# Patient Record
Sex: Female | Born: 1965 | Race: White | Hispanic: No | Smoking: Never smoker
Health system: Southern US, Community
[De-identification: ages and names within clinical notes are randomized; demographics above are authoritative.]

## PROBLEM LIST (undated history)

## (undated) DIAGNOSIS — K769 Liver disease, unspecified: Secondary | ICD-10-CM

## (undated) DIAGNOSIS — E282 Polycystic ovarian syndrome: Secondary | ICD-10-CM

## (undated) DIAGNOSIS — G43909 Migraine, unspecified, not intractable, without status migrainosus: Secondary | ICD-10-CM

## (undated) DIAGNOSIS — G4730 Sleep apnea, unspecified: Secondary | ICD-10-CM

## (undated) DIAGNOSIS — R748 Abnormal levels of other serum enzymes: Secondary | ICD-10-CM

## (undated) DIAGNOSIS — E038 Other specified hypothyroidism: Secondary | ICD-10-CM

## (undated) DIAGNOSIS — E039 Hypothyroidism, unspecified: Secondary | ICD-10-CM

## (undated) DIAGNOSIS — J45909 Unspecified asthma, uncomplicated: Secondary | ICD-10-CM

## (undated) HISTORY — DX: Other specified hypothyroidism: E03.8

## (undated) HISTORY — DX: Unspecified asthma, uncomplicated: J45.909

## (undated) HISTORY — DX: Sleep apnea, unspecified: G47.30

## (undated) HISTORY — DX: Polycystic ovarian syndrome: E28.2

## (undated) HISTORY — DX: Hypothyroidism, unspecified: E03.9

## (undated) HISTORY — DX: Migraine, unspecified, not intractable, without status migrainosus: G43.909

## (undated) HISTORY — DX: Abnormal levels of other serum enzymes: R74.8

## (undated) HISTORY — DX: Liver disease, unspecified: K76.9

---
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
## 1998-03-07 HISTORY — PX: LITHOTRIPSY: SUR834

## 2009-03-07 HISTORY — PX: OTHER SURGICAL HISTORY: SHX169

## 2010-01-12 ENCOUNTER — Encounter: Admission: RE | Admit: 2010-01-12 | Discharge: 2010-01-12 | Payer: Self-pay | Admitting: Unknown Physician Specialty

## 2010-03-07 HISTORY — PX: LASER ABLATION: SHX1947

## 2010-09-21 ENCOUNTER — Ambulatory Visit (HOSPITAL_COMMUNITY): Payer: Self-pay | Admitting: Psychology

## 2010-09-27 ENCOUNTER — Ambulatory Visit (INDEPENDENT_AMBULATORY_CARE_PROVIDER_SITE_OTHER): Payer: BC Managed Care – PPO | Admitting: Psychology

## 2010-09-27 DIAGNOSIS — F331 Major depressive disorder, recurrent, moderate: Secondary | ICD-10-CM

## 2010-10-11 ENCOUNTER — Encounter (HOSPITAL_COMMUNITY): Payer: BC Managed Care – PPO | Admitting: Psychology

## 2010-10-13 ENCOUNTER — Encounter (INDEPENDENT_AMBULATORY_CARE_PROVIDER_SITE_OTHER): Payer: BC Managed Care – PPO | Admitting: Psychology

## 2010-10-13 DIAGNOSIS — F331 Major depressive disorder, recurrent, moderate: Secondary | ICD-10-CM

## 2010-10-26 ENCOUNTER — Encounter (INDEPENDENT_AMBULATORY_CARE_PROVIDER_SITE_OTHER): Payer: BC Managed Care – PPO | Admitting: Psychology

## 2010-10-26 DIAGNOSIS — F331 Major depressive disorder, recurrent, moderate: Secondary | ICD-10-CM

## 2010-11-03 ENCOUNTER — Encounter (HOSPITAL_COMMUNITY): Payer: BC Managed Care – PPO | Admitting: Psychology

## 2010-11-10 ENCOUNTER — Encounter (INDEPENDENT_AMBULATORY_CARE_PROVIDER_SITE_OTHER): Payer: BC Managed Care – PPO | Admitting: Psychology

## 2010-11-10 DIAGNOSIS — F331 Major depressive disorder, recurrent, moderate: Secondary | ICD-10-CM

## 2010-11-24 ENCOUNTER — Encounter (INDEPENDENT_AMBULATORY_CARE_PROVIDER_SITE_OTHER): Payer: BC Managed Care – PPO | Admitting: Psychology

## 2010-11-24 DIAGNOSIS — F331 Major depressive disorder, recurrent, moderate: Secondary | ICD-10-CM

## 2010-11-24 DIAGNOSIS — F411 Generalized anxiety disorder: Secondary | ICD-10-CM

## 2010-12-08 ENCOUNTER — Encounter (INDEPENDENT_AMBULATORY_CARE_PROVIDER_SITE_OTHER): Payer: BC Managed Care – PPO | Admitting: Psychology

## 2010-12-08 DIAGNOSIS — F331 Major depressive disorder, recurrent, moderate: Secondary | ICD-10-CM

## 2010-12-22 ENCOUNTER — Encounter (INDEPENDENT_AMBULATORY_CARE_PROVIDER_SITE_OTHER): Payer: BC Managed Care – PPO | Admitting: Psychology

## 2010-12-22 DIAGNOSIS — F331 Major depressive disorder, recurrent, moderate: Secondary | ICD-10-CM

## 2011-01-05 ENCOUNTER — Encounter (HOSPITAL_COMMUNITY): Payer: BC Managed Care – PPO | Admitting: Psychology

## 2011-03-14 ENCOUNTER — Ambulatory Visit (INDEPENDENT_AMBULATORY_CARE_PROVIDER_SITE_OTHER): Payer: BC Managed Care – PPO | Admitting: Psychology

## 2011-03-14 DIAGNOSIS — F419 Anxiety disorder, unspecified: Secondary | ICD-10-CM

## 2011-03-14 DIAGNOSIS — F411 Generalized anxiety disorder: Secondary | ICD-10-CM

## 2011-03-14 DIAGNOSIS — Z7189 Other specified counseling: Secondary | ICD-10-CM

## 2011-03-14 DIAGNOSIS — F605 Obsessive-compulsive personality disorder: Secondary | ICD-10-CM | POA: Insufficient documentation

## 2011-03-14 DIAGNOSIS — F331 Major depressive disorder, recurrent, moderate: Secondary | ICD-10-CM

## 2011-03-14 DIAGNOSIS — Z62820 Parent-biological child conflict: Secondary | ICD-10-CM

## 2011-03-14 DIAGNOSIS — Z630 Problems in relationship with spouse or partner: Secondary | ICD-10-CM

## 2011-03-14 NOTE — Progress Notes (Signed)
THERAPIST PROGRESS NOTE  Session Time: 1005- 1111  Participation Level: Active  Behavioral Response: Well GroomedAlertDepressed  Type of Therapy: Individual Therapy  Treatment Goals addressed: Anxiety, Coping and Diagnosis: obsessive compulsive personality  Interventions: CBT, Solution Focused, Strength-based, Psychosocial Skills: sharing emotions, dealing with childhood issues and how they impact adult life and Supportive  Summary: Vonita Calloway is a 46 y.o. female who presents as pleasant and nicely groomed. She reports she had an okay holiday was alright.  She feels frustrated that when she had her time off work her family was unwilling to go to the movies with her. She reports that when her later time she had an okay time. The patient talked at length about the frustration she feels with her mother is depressed and in need of medication. She reports that she is very negative, as focused on how no one is there for her, and everything is wrong in her life. The patient reports her mother struggle to make any big decisions on her and that as a child her mother made all the major decisions and so it appears as if she never had the opportunity to learn. I suggested to the patient that the description she provide her her mother was very similar to what I see her in learning about her tickets this is true. She'll ever reports that she did take medication because she doesn't want to turn out like her aunt and her mother.  A commented to the patient that it appeared we were talking about problems although time when she came in for visits but that very seldom what she focus on solutions. She admits that she notices the same thing and I propose to her that she consider what goal she wishes to address in her therapy that will help improve the quality of her life. She remarked that her childhood was not grade and she was picked on quite a bit and that she tends not to trust anyone. I reminded her that  what we are his children isn't always true and as an adult we of choice to believe or not believe that this is how we did not realize we did. I suggested that she consider talking about her growing up years since his has had such an impact on her she is today and she admits that it feels scary and it makes her feel axis even think about doing so. I suggested this will provide her an opportunity to free herself of these issues and move forward in her life  The patient admits that she tends to be the control figure in a house and that her husband puts it "wears depends in the family". She admits that she has been very diligent in tracking expenses and she was a 66 year old child she reports absolutely every amount of money that she spends in a spreadsheet on a daily basis and admits that she is obsessive in this way the thought of not doing so contraceptive fear for her is missing something. She is afraid for her husband to be in charge of her finances because he has no experience and he was never taught how to do as a child. She admits that she seems a lot of control in the home for fear that something will happen to needs to be done.   Suicidal/Homicidal: No  Plan: Return again in 2 weeks.  Diagnosis: Axis I: Generalized Anxiety Disorder, Major depressive disorder, recurrent, moderate    Axis II: Obsessive- Compulsive Personality  [name], [facility] 03/14/2011

## 2011-03-14 NOTE — Patient Instructions (Signed)
1- Think about direction you would like to take in treatment. 2- Continue being aware of your thoughts and diverting negative thoughts.

## 2011-03-30 ENCOUNTER — Encounter (HOSPITAL_COMMUNITY): Payer: Self-pay | Admitting: Psychology

## 2011-03-30 ENCOUNTER — Ambulatory Visit (INDEPENDENT_AMBULATORY_CARE_PROVIDER_SITE_OTHER): Payer: BC Managed Care – PPO | Admitting: Psychology

## 2011-03-30 DIAGNOSIS — F411 Generalized anxiety disorder: Secondary | ICD-10-CM

## 2011-03-30 DIAGNOSIS — F419 Anxiety disorder, unspecified: Secondary | ICD-10-CM

## 2011-03-30 DIAGNOSIS — F605 Obsessive-compulsive personality disorder: Secondary | ICD-10-CM

## 2011-03-30 DIAGNOSIS — F331 Major depressive disorder, recurrent, moderate: Secondary | ICD-10-CM

## 2011-03-30 NOTE — Patient Instructions (Signed)
1-The patient is to meet with her spouse about plans for daughter's college. 2-Think about what you want your life to look like and what would cause you to be happy; be prepared to share at next visit. 3-List at least 6 positive attributes that you possess.

## 2011-03-30 NOTE — Progress Notes (Signed)
   THERAPIST PROGRESS NOTE  Session Time: 858 484 [id number] am  Participation Level: Active  Behavioral Response: Well GroomedAlertEuthymic  Type of Therapy: Individual Therapy  Treatment Goals addressed: Communication: with husband and Coping  Interventions: Solution Focused, Strength-based, Psychosocial Skills: communication and Supportive  Summary: Julia [name] is a 46 y.o. female who presents as pleasant and easily engaged. She has had a tough few weeks with additional situational stressors that are taking up her time and her focus.  She shared details of a problem in the family cabin in [place] that resulted in numerous trips there and a lot of work to deal with a power outage after the snow last week.  She is also dealing with thoughts and feelings related to her daughter's desire to attend a school in [place] that is very expensive and out of the budget.  The patient talked a great deal about the lack of support she feels from her husband around this issue given that she has been talking about it with him and their daughter for the past several years.  She feels disrespected and devalued by both her husband and daughter.  While talking about the issues, the patient began talking about the couple's marriage and his remark in front of their daughter's last evening that he will just seek a divorce.  She is not happy in her marriage but doesn't know what to do about that.  Her husband has told her his plans to move to [place] and he has been looking at homes.  She has told him she will not move and that she is happy where she is in [place].  The patient thinks he is serious about this and wonders if joining forces with her daughter about her going to [place] would cause the adolescent to appreciate her father more (since she has always had a problem with him).  I asked the patient what causes her to stay and she is not sure.  She sees herself as working much harder than her husband given that he  accepted an easier job working for $40k per year which is half of his earning potential.  She feels the biggest burden to bring in the income since he refuses to do anything differently.  Her husband has been having emotional affairs with women via the Internet and some are his old classmates.  She isn't going to be able to get off work to go to his class reunion and doesn't want him to go himself because she doesn't trust him.  Suicidal/Homicidal: No  Plan: Return again in 2 weeks.  Diagnosis: Axis I: Generalized Anxiety Disorder, Major depressive disorder    Axis II: No diagnosis    [name], [facility] 03/30/2011

## 2011-04-12 ENCOUNTER — Ambulatory Visit (INDEPENDENT_AMBULATORY_CARE_PROVIDER_SITE_OTHER): Payer: BC Managed Care – PPO | Admitting: Psychology

## 2011-04-12 ENCOUNTER — Encounter (HOSPITAL_COMMUNITY): Payer: Self-pay | Admitting: Psychology

## 2011-04-12 DIAGNOSIS — F331 Major depressive disorder, recurrent, moderate: Secondary | ICD-10-CM

## 2011-04-12 DIAGNOSIS — F605 Obsessive-compulsive personality disorder: Secondary | ICD-10-CM

## 2011-04-12 DIAGNOSIS — F411 Generalized anxiety disorder: Secondary | ICD-10-CM

## 2011-04-12 DIAGNOSIS — F419 Anxiety disorder, unspecified: Secondary | ICD-10-CM

## 2011-04-12 NOTE — Patient Instructions (Signed)
1-Write a five year plan. 2-Consider contacting an independent person to consult regarding college affordability.

## 2011-04-12 NOTE — Progress Notes (Signed)
   THERAPIST PROGRESS NOTE  Session Time: 905- 1005 am  Participation Level: Active  Behavioral Response: NeatAlertEuthymic  Type of Therapy: Individual Therapy  Treatment Goals addressed: Anger, Anxiety and Coping  Interventions: Solution Focused, Strength-based, Psychosocial Skills: communication, self-care, boundary setting and Supportive  Summary: Julia [name] is a 46 y.o. female who presents a few minutes late for here appointment.  She reports she 'chickened out' from talking with her husband about college and financial issues related to their daughter [name].  She did not want to get into an argument and therefore avoided the conversation despite that they had time to do so when they attended church without their daughters.  The patient feels irritated with her husband and daughter because of the situation given that she has been telling both of them for the past two years that they cannot afford to send her to an expensive college.  Her younger daughter shared that her older daughter was texting while driving so she attempted to do some checking up on this by examining the bills; she discovered her daughter had a massive number of texts and it would take an inordinate amount of time to review.  While she was checking phone calls she checked to see if her husband was still in touch with two women with whom he graduated and has talked to their entire married life.  He reports one of the women got married and she doesn't see any calls to her.  The other woman got divorced and is in touch with him daily in the afternoon before the patient returns from work.  She feels angry with him but really struggled to identify the vulnerable feelings.  She agrees that she does parent him in many ways and he does whatever he desires.  I commented that based on how she has shared their interactions that she has assumed that role.  The patient relayed that she really thought about the last time she was  happy and she reports she is not sure if there was ever a time she was happy.  I talked with her about the possibility of psychiatric evaluation to determine medication needs and she remains unsure but will consider.  Suicidal/Homicidal: No  Plan: Return again in 2 weeks.  Diagnosis: Axis I: Anxiety Disorder NOS, Major depressive disorder, recurrent    Axis II: Obsessive- Compulsive Personality     [name], [facility] 04/12/2011

## 2011-04-27 ENCOUNTER — Ambulatory Visit (INDEPENDENT_AMBULATORY_CARE_PROVIDER_SITE_OTHER): Payer: BC Managed Care – PPO | Admitting: Psychology

## 2011-04-27 DIAGNOSIS — F419 Anxiety disorder, unspecified: Secondary | ICD-10-CM

## 2011-04-27 DIAGNOSIS — Z630 Problems in relationship with spouse or partner: Secondary | ICD-10-CM

## 2011-04-27 DIAGNOSIS — Z62820 Parent-biological child conflict: Secondary | ICD-10-CM

## 2011-04-27 DIAGNOSIS — F411 Generalized anxiety disorder: Secondary | ICD-10-CM

## 2011-04-27 DIAGNOSIS — F331 Major depressive disorder, recurrent, moderate: Secondary | ICD-10-CM

## 2011-04-27 DIAGNOSIS — Z7189 Other specified counseling: Secondary | ICD-10-CM

## 2011-04-27 DIAGNOSIS — F605 Obsessive-compulsive personality disorder: Secondary | ICD-10-CM

## 2011-04-27 NOTE — Patient Instructions (Addendum)
1-Tap into feelings about the love that you have for your daughter and how you want to be there for her. 2-Take time on non-work days to be authentic in your feelings with you.

## 2011-04-29 ENCOUNTER — Encounter (HOSPITAL_COMMUNITY): Payer: Self-pay | Admitting: Psychology

## 2011-04-29 NOTE — Progress Notes (Signed)
   THERAPIST PROGRESS NOTE  Session Time: 405- 500 pm  Participation Level: Active  Behavioral Response: NeatAlertEuthymic  Type of Therapy: Individual Therapy  Treatment Goals addressed: Communication: in family and Coping  Interventions: Solution Focused, Strength-based, Psychosocial Skills: communicating thoughts and feelings and Supportive  Summary: Brexlee Heberlein is a 46 y.o. female who presents on time for her appointment. The patient is negatively focused which is her usual presentation.  She began the session by talking about work issues and I redirected her to address the family issues.  She reports having had a conversation with her husband about the finances and laid out their budget to show him that their daughter attending a school at a cost of 20k per year was unrealistic for them and she will not qualify for financial aid.  He appeared surprised and actually worked the numbers and came up with the same amount.  She reports he will still not commit to engage the conversation with their daughter and has asked the patient several times if she has done so.  He avoids scheduling a time where they can present the information together.  I suggested it appears she has accepted the role of 'bad cop' and he is 'good cop' and she agrees.  She is angry about this but admits she has allowed this to happen and knows that if it were left up to her husband he would never tell her and would continue to discuss this as an option.  The patient began addressing her daughter's kidney issues and admits she is very sick physically and that she has school avoidant behaviors as well and that the chance of her attending school regularly is low.  I suggested that she talk about her feelings of having a child with a chronic illness and having to see her in pain.  The patient was unable to do so and admits that she has locked her feelings up for so long that she cannot.  She did tear up but would not permit  herself to cry.  At any point I attempted to draw out her feelings she diverted to intellect and I discussed with her how this is how I have been relating to her in order to establish a trusting relationship.  I suggested it was time for her to try and tap her feelings.  Suicidal/Homicidal: No  Plan: Return again in 2 weeks.  Diagnosis: Axis I: Major depressive disorder,  Anxiety Disorder NOS    Axis II: No diagnosis    [name], [facility] 04/29/2011

## 2011-05-11 ENCOUNTER — Encounter (HOSPITAL_COMMUNITY): Payer: Self-pay | Admitting: Psychology

## 2011-05-11 ENCOUNTER — Ambulatory Visit (INDEPENDENT_AMBULATORY_CARE_PROVIDER_SITE_OTHER): Payer: BC Managed Care – PPO | Admitting: Psychology

## 2011-05-11 DIAGNOSIS — F605 Obsessive-compulsive personality disorder: Secondary | ICD-10-CM

## 2011-05-11 DIAGNOSIS — F331 Major depressive disorder, recurrent, moderate: Secondary | ICD-10-CM

## 2011-05-11 DIAGNOSIS — F411 Generalized anxiety disorder: Secondary | ICD-10-CM

## 2011-05-11 DIAGNOSIS — F419 Anxiety disorder, unspecified: Secondary | ICD-10-CM

## 2011-05-11 NOTE — Progress Notes (Signed)
   THERAPIST PROGRESS NOTE  Session Time: 1003- 1108 am  Participation Level: Active  Behavioral Response: NeatAlertEuthymic  Type of Therapy: Individual Therapy  Treatment Goals addressed: Anger, Communication: relationships and Coping  Interventions: Solution Focused, Strength-based, Psychosocial Skills: coping, communication and Supportive  Summary: Julia [name] is a 46 y.o. female who presents on time for her appointment.  The patient is pleasant and easily engaged.  She talked a lot about the issues she is struggling with her 32 year old daughter.  She has been ill but is slow to follow up with needed medical care and has been out of school for three weeks first because of whooping cough and kidney infection but then because she simply refused to attend.  I talked with the patient about her daughter's choice to attend/not attend school and about how she and her husband need to back off and let her face consequences for her decisions.  The patient struggles to know when it is okay to back off because she thought that she and her husband could get in trouble for not sending her to school.  I informed the patient that her daughter is 32 and she can decide not to go to school and that they are not accountable.  I also suggested that using natural and logical consequences might be the best way to go.  I reminded her that having her at home to experience the consequences provides for teachable moments when she makes poor choices.  I also suggested that her refusal to seek medical care was a choice and that if she got a UTI she would need to deal with the pain and the consequence of not getting her medical treatment.  The patient was unable to complete the homework where I asked her to tap into her feelings related to her daughter.  She is very disconnected from her feelings and admits she has been like this since her children were young.  She admits that after she checked out emotionally this is  when her husband started talking to other women.  I suggested that they could change this and that marital therapy was always an option.  She is uncertain if she wishes to pursue this.  I suggested she invite her spouse in for a session but then learned that he wasn't even aware that she was in therapy.  I suggested she consider discussing with him and invite him in to discuss how to deal with issues related to their daughter.  Suicidal/Homicidal: No  Plan: Return again in 2 weeks.  Diagnosis: Axis I: Anxiety Disorder NOS, Major depressive disorder, recurrent    Axis II: No diagnosis    [name], [facility] 05/11/2011

## 2011-05-11 NOTE — Patient Instructions (Signed)
1-Consider setting boundaries with your daughter around school attendance and caring for her own medical needs. 2-Consider marital therapy.

## 2011-05-30 ENCOUNTER — Ambulatory Visit (INDEPENDENT_AMBULATORY_CARE_PROVIDER_SITE_OTHER): Payer: BC Managed Care – PPO | Admitting: Psychology

## 2011-05-30 DIAGNOSIS — Z62820 Parent-biological child conflict: Secondary | ICD-10-CM

## 2011-05-30 DIAGNOSIS — Z630 Problems in relationship with spouse or partner: Secondary | ICD-10-CM

## 2011-05-30 DIAGNOSIS — F419 Anxiety disorder, unspecified: Secondary | ICD-10-CM

## 2011-05-30 DIAGNOSIS — F605 Obsessive-compulsive personality disorder: Secondary | ICD-10-CM

## 2011-05-30 DIAGNOSIS — F331 Major depressive disorder, recurrent, moderate: Secondary | ICD-10-CM

## 2011-05-30 DIAGNOSIS — F411 Generalized anxiety disorder: Secondary | ICD-10-CM

## 2011-05-30 DIAGNOSIS — Z7189 Other specified counseling: Secondary | ICD-10-CM

## 2011-05-30 NOTE — Patient Instructions (Addendum)
1-Take at least one hour out for yourself daily. 2-Identify at least one activity that you would be willing to engage. 3-Let you yes be yes and your no be no. 4-Do not permit anyone to boss you around except your boss. 

## 2011-05-30 NOTE — Progress Notes (Deleted)
1-Take at least one hour out for yourself daily. 2-Identify at least one activity that you would be willing to engage. 3-Let you yes be yes and your no be no. 4-Do not permit anyone to boss you around except your boss.

## 2011-05-31 ENCOUNTER — Encounter (HOSPITAL_COMMUNITY): Payer: Self-pay | Admitting: Psychology

## 2011-05-31 NOTE — Progress Notes (Signed)
   THERAPIST PROGRESS NOTE  Session Time: 910- 1005 am  Participation Level: Active  Behavioral Response: NeatAlertTearfulSad  Type of Therapy: Individual Therapy  Treatment Goals addressed: Communication: assertive and Coping  Interventions: Solution Focused, Strength-based, Psychosocial Skills: communication, coping, self-care and Supportive  Summary: Avanthika Dehnert is a 46 y.o. female who presents as quiet but pleasant.  She reports things have not been going well and that she has been more depressed and hopeless due to family issues.  Her daughter has continued to be ill and this is stressful for her.  When discussing she went on to talk about her husband's sister and parents moving to [place] and that this will increase her stress too.  I brought the patient back to talk about her and suggested that she will move away from talking about her feelings to talking about circumstances and she admits this is true.  I prodded the patient and she reports that her husband and both daughters had been making hurtful remarks to her about not having any friends and not being liked.  She knows that she is permitting this to occur and that her children have listened and watched how she and her husband have interacted for years.  She feels defeated by the family.  She is not suicidal and denies that she would ever kill herself or hurt anyone else.  She feels stuck and unsure how to change her circumstances.  I talked with the patient about the importance of good self-care and she stared at me with a blank look as if she had never heard this before.  With prompting the patient was able to identify what she has done in the past to care for herself including scrap booking but admits she is overwhelmed with trying to manage things for her family that she doesn't have the time.  She was able to come up with a few options to improve her self-care but required assistance to expand those ideas.  She thinks going to  her cabin for a little rest might be helpful but admits that she would likely clean while there and suggests this is something she enjoys.  I talked with the patient about how the words of others can cause wounds.  She admits that this occurred when a child and that she doesn't have any friends because this wounding was so deep.  I suggested that she has many positive characteristics and that she would be able to develop friendships if she took the time to do so.  She was tearful throughout our discussion about this and admits to increased depression but denies any suicidal thinking or homicidal thinking.  I explained the availability of MH IOP and suggested this as an option but she is not willing.  She was willing to accept a psychiatry appointment for evaluation of medication since this has never occurred.  She has only ever seen her family doctor and has never achieved positive outcomes from the antidepressant.  Suicidal/Homicidal: No  Plan: Return again in 1 weeks.  Diagnosis: Axis I: Major depressive disorder, anxiety disorder    Axis II: No diagnosis    [name], [facility] 05/31/2011

## 2011-06-07 ENCOUNTER — Ambulatory Visit (INDEPENDENT_AMBULATORY_CARE_PROVIDER_SITE_OTHER): Payer: BC Managed Care – PPO | Admitting: Psychiatry

## 2011-06-07 ENCOUNTER — Encounter (HOSPITAL_COMMUNITY): Payer: Self-pay | Admitting: Psychiatry

## 2011-06-07 VITALS — BP 124/94 | HR 82 | Ht 65.25 in | Wt 194.0 lb

## 2011-06-07 DIAGNOSIS — R748 Abnormal levels of other serum enzymes: Secondary | ICD-10-CM

## 2011-06-07 MED ORDER — BUPROPION HCL ER (XL) 150 MG PO TB24: 150.0000 mg | ORAL_TABLET | Freq: Every day | ORAL | Status: DC
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
## 2011-06-13 ENCOUNTER — Encounter (HOSPITAL_COMMUNITY): Payer: Self-pay | Admitting: Psychology

## 2011-06-13 ENCOUNTER — Ambulatory Visit (INDEPENDENT_AMBULATORY_CARE_PROVIDER_SITE_OTHER): Payer: BC Managed Care – PPO | Admitting: Psychology

## 2011-06-13 DIAGNOSIS — F331 Major depressive disorder, recurrent, moderate: Secondary | ICD-10-CM

## 2011-06-13 NOTE — Progress Notes (Signed)
   THERAPIST PROGRESS NOTE  Session Time: 904-  Participation Level: Active  Behavioral Response: NeatAlertEuthymic with daily headache for past week (no relief)  Type of Therapy: Individual Therapy  Treatment Goals addressed: Anxiety, Communication: in relationships and Coping  Interventions: Solution Focused, Strength-based, Psychosocial Skills: self-care, communication, coping with stress and Supportive  Summary: Julia [name] is a 46 y.o. female who presents as pleasant but in pain from a daily headache that has persisted with no relief for the past week.  She has consulted her headache MD who had no suggestions for her.  She continues to take OTC's but is not getting any relief.  This counselor and the patient talked about the importance of self-care in order to improve her depressive symptoms.  She admits she doesn't know how to control her anxiety since she feels limited by plantar fascitis plan and cannot exercise.  I talked with the patient about other strategies for coping as well as alternative forms of exercise that doesn't have to stress her feet (chair exercises, water aerobics/zumba).  The patient appears reluctant to commit to change because of the demands placed on her by her family.  I suggested the patient needed to start caring for her needs first.  She talked about the need to eat more wisely.  We addressed the importance of a healthy diet for her body and because her children are learning nutrition from her.  I suggested she consider having her daughters participate in menu planning, grocery shopping and meal prep; after some discussion the patient agreed this would be good and that both of her children have expressed interest.  She and her mother are going up [place] on a trip to visit family in [place] and [place]; she identified the negatives (staying with her aunt in [place] and having to take the dogs) and I suggested that both of these were choices and that she could stay in a hotel  and board the dogs here in [place] and really take a vacation.  I sometimes wonder if she doesn't set herself up to be unhappy.  Suicidal/Homicidal: No  Plan: Return again in 3 weeks after she returns from her trip.  Diagnosis: Axis I: Major depressive disorder, moderate; anxiety disorder    Axis II: No diagnosis    [name], [facility] 06/13/2011

## 2011-06-13 NOTE — Patient Instructions (Addendum)
1-Make plans to go to [place] with mother. 2-Introduce menu planning to daughters and let them pick the days they will cook (two days each).  Take aways: HALT- Are you hungry, angry, lonely tired? Why are you eating? Only eat when your body is physically telling you it needs.

## 2011-06-28 ENCOUNTER — Ambulatory Visit (INDEPENDENT_AMBULATORY_CARE_PROVIDER_SITE_OTHER): Payer: BC Managed Care – PPO | Admitting: Psychology

## 2011-06-28 DIAGNOSIS — Z62820 Parent-biological child conflict: Secondary | ICD-10-CM

## 2011-06-28 DIAGNOSIS — F331 Major depressive disorder, recurrent, moderate: Secondary | ICD-10-CM

## 2011-06-28 DIAGNOSIS — Z7189 Other specified counseling: Secondary | ICD-10-CM

## 2011-06-28 DIAGNOSIS — F411 Generalized anxiety disorder: Secondary | ICD-10-CM

## 2011-06-28 DIAGNOSIS — F419 Anxiety disorder, unspecified: Secondary | ICD-10-CM

## 2011-06-28 DIAGNOSIS — F605 Obsessive-compulsive personality disorder: Secondary | ICD-10-CM

## 2011-06-29 ENCOUNTER — Encounter (HOSPITAL_COMMUNITY): Payer: Self-pay | Admitting: Psychology

## 2011-06-29 NOTE — Progress Notes (Signed)
   THERAPIST PROGRESS NOTE  Session Time: 1015- 1107 am  Participation Level: Active  Behavioral Response: NeatAlertEuthymic  Type of Therapy: Individual Therapy  Treatment Goals addressed: Communication: thoughts and feelings and Coping  Interventions: Solution Focused, Strength-based, Psychosocial Skills: coping, communication and Supportive  Summary: Julia [name] is a 46 y.o. female who presents as pleasant and easily engaged.  She appears relaxed and in a good mood.  She shared details of the two weeks she was away from home with most of the content being positive.  She dealt with a few instances of difficult family but reports that overall if was a good time away and she is tired but refreshed.  The patient did not complain about her work, she did minimal complaining about her husband and children.  She is appropriately concerned that her oldest daughter has not completed her financial aid application or made any moves toward next year and entering college.  I suggested to her mother that perhaps this was her way of sabotaging herself because she is really not ready to go away from home; her mother doesn't know and doesn't plan on completing the necessary steps.  She sat down with her husband because he has been requesting to see their budget and was questioning her on why she says they don't have enough money.  She was able to show him the budget but he is still somewhat unsure.  I suggested that oftentimes when talking about her husband she sounds parental and she admits this is true.  I suggested that she begin to place him on equal footing and that in doing so their relationship might improve; she is willing to try.  Suicidal/Homicidal: No  Plan: Return again in 2 weeks.  Diagnosis: Axis I: Generalized Anxiety Disorder; Major depressive disorder, recurrent    Axis II: Obsessive- Compulsive Personality     [name], [facility] 06/29/2011

## 2011-07-05 ENCOUNTER — Encounter (HOSPITAL_COMMUNITY): Payer: Self-pay | Admitting: Psychiatry

## 2011-07-05 ENCOUNTER — Ambulatory Visit (INDEPENDENT_AMBULATORY_CARE_PROVIDER_SITE_OTHER): Payer: BC Managed Care – PPO | Admitting: Psychiatry

## 2011-07-05 VITALS — BP 135/95 | HR 100 | Ht 65.25 in | Wt 195.0 lb

## 2011-07-05 DIAGNOSIS — Z7189 Other specified counseling: Secondary | ICD-10-CM

## 2011-07-05 DIAGNOSIS — F331 Major depressive disorder, recurrent, moderate: Secondary | ICD-10-CM

## 2011-07-05 DIAGNOSIS — Z62820 Parent-biological child conflict: Secondary | ICD-10-CM

## 2011-07-05 MED ORDER — BUPROPION HCL ER (XL) 150 MG PO TB24: 150.0000 mg | ORAL_TABLET | Freq: Every day | ORAL | Status: DC

## 2011-07-05 NOTE — Progress Notes (Signed)
[facility] Behavioral Health Follow-up Outpatient Visit  Julia [name] [birth year]  Date: 07/05/11  History of Chief Complaint: No chief complaint on file.   PRESENTING CHIEF COMPLAINT:  HPI Comments: HISTORY OF CURRENT ILLNESS:  Julia [name] is a 46 Y/o female with a past psychiatric history significant for Major Depressive Disorder, recurrent moderate. The patient is referred for psychiatric services for  medication management.   The patient reports that her main stressors continue to be: her 54 y/o daughter, her marital stressors, her father passing away, and is now dealing with her mother living close. In the area of affective symptoms, patient appears euthymic. Patient denies current suicidal ideation, intent, or plan. Patient denies current homicidal ideation, intent, or plan. Patient denies auditory hallucinations. Patient denies visual hallucinations. Patient denies symptoms of paranoia. Patient states sleep is good, with approximately 8.5 hours of sleep per night with a CPAP. Appetite is good, but she admits to continued ingestion of junk food. Energy level is low. Patient endorses symptoms of anhedonia. Patient endorses hopelessness, helplessness, or guilt.   Denies any recent episodes consistent with mania, particularly decreased need for sleep with increased energy, grandiosity, impulsivity, hyperverbal and pressured speech, or increased productivity. Denies any recent symptoms consistent with psychosis, particularly auditory or visual hallucinations, thought broadcasting/insertion/withdrawal, or ideas of reference. Also denies excessive worry to the point of physical symptoms as well as any panic attacks. Denies any history of trauma or symptoms consistent with PTSD such as flashbacks, nightmares, hypervigilance, feelings of numbness or inability to connect with others.   Review of Systems  Constitutional: Positive for fatigue.  Respiratory: Negative for chest tightness and shortness of  breath.  Cardiovascular: Negative for chest pain.  Psychiatric/Behavioral: Positive for sleep disturbance and decreased concentration. Negative for suicidal ideas, hallucinations, behavioral problems, confusion, self-injury and agitation.   (Hypo) Manic Symptoms:  Elevated Mood: No  Irritable Mood: No  Grandiosity: No  Distractibility: No  Lability of Mood: No  Delusions: No  Hallucinations: No  Impulsivity: No  Sexually Inappropriate Behavior: No  Financial Extravagance: No  Flight of Ideas: No  Anxiety Symptoms:  Excessive Worry: Yes  Panic Symptoms: No-last attack in college  Agoraphobia: No  Obsessive Compulsive: No Specific Phobias: Yes Claustrophobia  Social Anxiety: Yes  Psychotic Symptoms:  Hallucinations: No None  Delusions: No  Paranoia: No  Ideas of Reference: No  PTSD Symptoms:  Ever had a traumatic exposure: No  Had a traumatic exposure in the last month: No  Re-experiencing: No None  Hypervigilance: No  Hyperarousal: No None  Avoidance: No None  Traumatic Brain Injury: No   Physical Exam  Vitals reviewed.  Constitutional: No distress.     History of Loss of Consciousness: No  Seizure History: No  Cardiac History: No   Allergies: Reviewed Allergies   Allergen  Reactions   .  Relafen (Nabumetone)  Hives, Itching and Swelling   .  Sulfa Antibiotics     PCP: Harl Bowie   Current Medications: Reviewed Current Outpatient Prescriptions on File Prior to Visit  Medication Sig Dispense Refill  . albuterol (PROVENTIL HFA;VENTOLIN HFA) 108 (90 BASE) MCG/ACT inhaler Inhale 2 puffs into the lungs every 6 (six) hours as needed. prn       . ampicillin (PRINCIPEN) 500 MG capsule Take 500 mg by mouth 2 (two) times daily. Uses only one time per day unless having a flair       . buPROPion (WELLBUTRIN XL) 150 MG 24 hr tablet Take 1 tablet (150  mg total) by mouth daily.  90 tablet  0  . Cholecalciferol (VITAMIN D) 2000 UNITS tablet Take 2,000 Units by mouth  daily. Every other day       . cyclobenzaprine (FLEXERIL) 10 MG tablet Take 10 mg by mouth 3 (three) times daily as needed. Takes one half tab       . fexofenadine (ALLEGRA) 180 MG tablet Take 180 mg by mouth daily.        . Fluticasone-Salmeterol (ADVAIR DISKUS) 100-50 MCG/DOSE AEPB Inhale 1 puff into the lungs every 12 (twelve) hours. Will use one time per day but if having a flair up will use two times per day        . L-Methylfolate (DEPLIN PO) Take 400 mcg by mouth 2 (two) times daily.      . modafinil (PROVIGIL) 200 MG tablet Take 200 mg by mouth daily. Take one and one half tablets per day       . mometasone (ASMANEX) 220 MCG/INH inhaler Inhale 1 puff into the lungs daily. Prescribed by Dr. Tempie Donning.      . montelukast (SINGULAIR) 10 MG tablet Take 10 mg by mouth at bedtime.        Marland Kitchen thyroid (ARMOUR) 30 MG tablet Take 30 mg by mouth every morning.       . topiramate (TOPAMAX) 50 MG tablet Take 100 mg by mouth at bedtime. 50 mg QAM, 100 QPM      . traMADol (ULTRAM) 50 MG tablet Take 50 mg by mouth every 6 (six) hours as needed. prn       . zolmitriptan (ZOMIG) 5 MG tablet Take 5 mg by mouth as needed. prn        Previous Psychotropic Medications: Reviewed Patient has had a trial of the following psychotropic medications:  Wellbutrin 150 mg  Celexa 20-took for a couple years-sexual side  Effexor- couldn't stay away  Prozac-took for a little while-  Patient is currently on the following psychotropic medications:  Wellbutrin   Substance Abuse History in the last 12 months: Reviewed Patient denies   SUBSTANCE USE HISTORY: Reviewed Caffeine: Rare use  Nicotine: Patient denies.  Alcohol: Patient denies.  Illicit Drugs: Patient denies.   Blackouts: No  DT's: No  Withdrawal Symptoms: No None   Medical: MGM-Strokes, Biliary cirrhosis, Hypertension,hypothyroidism; MGF-stroke, MI; Mother-basal cell cancer, HTN; Father-colon cancer;  Psychiatric illness: Maternal Aunt-Mood disorder,  Maternal great aunt-thought disorder  Substance abuse: Alcoholism: MGF, brother;  Suicides: Patient.   Mental Status Examination/Evaluation:  Objective: Appearance: Casual and Well Groomed   Eye Contact:: Good   Speech: Clear and Coherent and Normal Rate   Volume: Normal   Mood: "frustrated mood"   Affect: Appropriate and Full Range   Thought Process: Coherent, Intact and Loose   Orientation: Full   Thought Content: WDL   Suicidal Thoughts: No   Homicidal Thoughts: No   Judgement: Intact   Insight: Fair  Psychomotor Activity: Normal   Akathisia: No   Handed: Right   AIMS (if indicated): Not indicated   Assets: Communication Skills  Desire for Improvement  Financial Resources/Insurance  Housing  Intimacy  Vocational/Educational    Assessment:  AXIS I  Major Depressive Disorder, recurrent, moderate , Parent Child relational problem  AXIS II  No diagnosis   AXIS III  .  Subclinical hypothyroidism     .  Migraines     .  Asthma with allergic rhinitis     .  Sleep apnea syndrome     .  Elevated liver enzymes     .  Liver disease       Fatty liver   AXIS IV  problems with primary support group   AXIS V  51-60 moderate symptoms     PLAN:  1. Affirm with the patient that the medications are taken as ordered. Patient expressed understanding of how their medications were to be used.  2. Continue the following psychiatric medications as written prior to this appointment:  a) Continue Wellbutrin 150 mg. As noted previously, symptoms are caused by stressors which she has identified in therapy but has not yet taken steps to deal with. She does not currently have suicidal ideation or severe anhedonia, and is currently not interested in a dose adjustment. I have stressed the need to deal with her most significant stressors, particularly her daughter's problems, and later her marital issues. I have stressed the need for improvement in her diet and the incorporation of exercise in her  life, possibly as part of her one hour period daily which she is to devote to her personal development as outlined by her therapist.  B) I have asked the patient to report any worsening of depressive symptoms, at which point we will increase bupropion.  3. Therapy: brief supportive therapy provided. Discussed her psychosocial stressors. Continue current services.  4. Risks and benefits, side effects and alternatives discussed with patient, he/she was given an opportunity to ask questions about his/her medication, illness, and treatment. All current psychiatric medications have been reviewed and discussed with the patient and adjusted as clinically appropriate. The patient has been provided an accurate and updated list of the medications being now prescribed.  5. Patient told to call clinic if any problems occur. Patient advised to go to ER if she should develop SI/HI, side effects, or if symptoms worsen. Has crisis numbers to call if needed.  6. No labs warranted at this time. Patient has asked to test l-metlyfolate level and MTHFR. Will consider this if after resolving all stressors, an improvement in diet, and addition of exercise to her life routine, does not bring about resolution of her mood and fatigue.  7. The patient was encouraged to keep all PCP and specialty clinic appointments.  8. Patient was instructed to return to clinic in 1 month.  9. The patient expressed understanding of the plan outlined above and agrees with the plan.     Jacqulyn Cane, MD

## 2011-07-18 ENCOUNTER — Ambulatory Visit (INDEPENDENT_AMBULATORY_CARE_PROVIDER_SITE_OTHER): Payer: BC Managed Care – PPO | Admitting: Psychology

## 2011-07-18 ENCOUNTER — Telehealth (HOSPITAL_COMMUNITY): Payer: Self-pay

## 2011-07-18 ENCOUNTER — Encounter (HOSPITAL_COMMUNITY): Payer: Self-pay | Admitting: Psychology

## 2011-07-18 DIAGNOSIS — Z630 Problems in relationship with spouse or partner: Secondary | ICD-10-CM

## 2011-07-18 DIAGNOSIS — Z62820 Parent-biological child conflict: Secondary | ICD-10-CM

## 2011-07-18 DIAGNOSIS — Z7189 Other specified counseling: Secondary | ICD-10-CM

## 2011-07-18 DIAGNOSIS — F331 Major depressive disorder, recurrent, moderate: Secondary | ICD-10-CM

## 2011-07-18 NOTE — Progress Notes (Signed)
   THERAPIST PROGRESS NOTE  Session Time: 805- 859 am  Participation Level: Active  Behavioral Response: NeatAlertEuthymic  Type of Therapy: Individual Therapy  Treatment Goals addressed: Anxiety, Communication: in relationships and Coping  Interventions: Solution Focused, Strength-based, Psychosocial Skills: communication and coping and Supportive  Summary: Julia [name] is a 46 y.o. female who presents as pleasant and casually groomed.  She spent several days of her weeklong vacation at the family cabin with her mother and reports it was a nice break.  Her oldest daughter now has mononucleosis and she is hoping that she will have the grades to graduate in a few weeks.  She reports that her daughter has been ill for the past 18 months from various illnesses and that she is concerned about why this is happening.  She is frustrated that her daughter will not make calls to schedule her own medical appointments and would rather go to Urgent Care when she is much sicker instead of intervening sooner.  The patient doesn't think she will do well on her own because of not tending to her physical health needs.  Ms. Hilgert reports her daughter did follow through with getting the recommendation form completed for a schloarship application but then mailed the application without the recommendation.  She doesn't know how the patient intends to pay for school and the patient doesn't plan on taking out a bunch of loans that she will have to pay back.  She and her husband have not discussed this any further and i suggested this needed to occur.  I asked the patient how she intends to engage her relationship with her husband but she isn't sure.  She doesn't talk to anyone about how she feels and has never shared her feelings with him.  I asked why she thinks he talks to other women online and she admits it is likely because they talk about feelings and she avoids the topic.  I suggested that it is important for  them to work on their relationship given that her children are starting to age out of the home and she knows this is true.  She wants to be in her marriage but is anxious about how to expose her feelings.  The patient appears to be lost when trying to respond to a question in the session related to how she feels.  This is something I will continue to discuss in therapy in hopes that she will let some feelings emerge.  Suicidal/Homicidal: No  Plan: Return again in 2 weeks.  Diagnosis: Axis I: Major depressive disorder, recurrent    Axis II: No diagnosis    [name], [facility] 07/18/2011

## 2011-07-18 NOTE — Telephone Encounter (Signed)
Called patient to discuss. Initiation of 5-HTP in her treatment regime. Not knowing strength and concentration, informed the patient that sertraline would be a better option. She also asked about checking serotonin levels.

## 2011-07-18 NOTE — Telephone Encounter (Signed)
Would like to talk to you about 5-HTP. Please call

## 2011-08-09 ENCOUNTER — Encounter (HOSPITAL_COMMUNITY): Payer: Self-pay | Admitting: Psychology

## 2011-08-09 ENCOUNTER — Ambulatory Visit (INDEPENDENT_AMBULATORY_CARE_PROVIDER_SITE_OTHER): Payer: BC Managed Care – PPO | Admitting: Psychology

## 2011-08-09 DIAGNOSIS — Z7189 Other specified counseling: Secondary | ICD-10-CM

## 2011-08-09 DIAGNOSIS — Z62820 Parent-biological child conflict: Secondary | ICD-10-CM

## 2011-08-09 DIAGNOSIS — F331 Major depressive disorder, recurrent, moderate: Secondary | ICD-10-CM

## 2011-08-09 DIAGNOSIS — Z630 Problems in relationship with spouse or partner: Secondary | ICD-10-CM

## 2011-08-09 DIAGNOSIS — F411 Generalized anxiety disorder: Secondary | ICD-10-CM

## 2011-08-09 DIAGNOSIS — F605 Obsessive-compulsive personality disorder: Secondary | ICD-10-CM

## 2011-08-09 DIAGNOSIS — F419 Anxiety disorder, unspecified: Secondary | ICD-10-CM

## 2011-08-09 NOTE — Progress Notes (Signed)
   THERAPIST PROGRESS NOTE  Session Time: 806-904 am  Participation Level: Active  Behavioral Response: NeatAlertEuthymic  Type of Therapy: Individual Therapy  Treatment Goals addressed: Anxiety, Communication: in relationships and Coping  Interventions: Solution Focused, Strength-based, Psychosocial Skills: communication, coping with stress, catastropic thinking and Supportive  Summary: Julia [name] is a 46 y.o. female who presents as pleasant and nicely groomed.  She admits that she isn't feeling well and almost canceled. She doesn't really know what she wants to address today.  She hasn't talked to her daughter [name] about issues related to college and admits that she needs to do so.  She notices that her younger daughter [name] has started to become more difficult to manage recently and she knows it is from her watching Rebecca's behaviors.  Her husband continues to talk about leaving and going to live in [place].  The patient notes that she thinks her biggest stressor is her work.  I challenged the patient to consider her commitment to making changes and reminded her that there is nothing I can do for this to happen outside of support, encourage, and provide suggestions.  The patient appears uncomfortable in the session and has rapid breathing, difficulty with eye contact, and increased motor activity.  I asked her what she is thinking and feeling and she has difficulty articulating.  She feels stuck in her situation but has made limited changes.  She identified that she tries to think positively.  She is frustrated with [name] who has failed to do what is needed to go to college and the patient states she isn't going to do anything else to help.  I suggested that her 17 year old daughter graduates in one week and that it is okay for her to expect more from her as a result.  She notes that the reason her daughter expects so much is that she learned it from her parents.  I suggested  that as an adult member of the household it would be acceptable for her and her husband to expect more from her and that might include paying rent/food, or car insurance/gas for her car.  The patient is unsure if she's ready to do that, although she notes that is what it was like for her and she did well.  I asked her about how it feels when her husband states he wants to run away to [place]; she denied having a feeling attached since he has said it for so long.  I stated that perhaps this was a fantasy since he could have gone at any point and yet he still stays with her and lives in [place].  She has told him she will not move to [place].  She suggests she doesn't do relationships well and that closeness makes her uncomfortable.  She was able to be actively engaged with her children when they were younger but has difficulty expressing her affections for them now that they are older.  Suicidal/Homicidal: No  Plan: Return again in 2 weeks.  Diagnosis: Axis I: Major depressive disorder, recurrent; GAD    Axis II: Obsessive- Compulsive Personality     [name], [facility] 08/09/2011

## 2011-08-30 ENCOUNTER — Ambulatory Visit (INDEPENDENT_AMBULATORY_CARE_PROVIDER_SITE_OTHER): Payer: BC Managed Care – PPO | Admitting: Psychiatry

## 2011-08-30 ENCOUNTER — Encounter (HOSPITAL_COMMUNITY): Payer: Self-pay | Admitting: Psychiatry

## 2011-08-30 ENCOUNTER — Telehealth (HOSPITAL_COMMUNITY): Payer: Self-pay | Admitting: Psychiatry

## 2011-08-30 VITALS — BP 135/89 | HR 70 | Wt 193.0 lb

## 2011-08-30 DIAGNOSIS — F329 Major depressive disorder, single episode, unspecified: Secondary | ICD-10-CM

## 2011-08-30 DIAGNOSIS — F331 Major depressive disorder, recurrent, moderate: Secondary | ICD-10-CM

## 2011-08-30 DIAGNOSIS — Z62820 Parent-biological child conflict: Secondary | ICD-10-CM

## 2011-08-30 MED ORDER — BUPROPION HCL ER (XL) 150 MG PO TB24: 150.0000 mg | ORAL_TABLET | Freq: Every day | ORAL | Status: DC

## 2011-08-30 NOTE — Progress Notes (Signed)
[facility] Behavioral Health Follow-up Outpatient Visit  Julia [name] Mar 26, 1965  Date: 08/30/2011  HPI Comments: HISTORY OF CURRENT ILLNESS:  Ms. Julia [name] is a 46 Y/o female with a past psychiatric history significant for Major Depressive Disorder, recurrent moderate. The patient is referred for psychiatric services for medication management.   The patient reports that her 79 y/o daughter is going to college in [place].  She reports that she recently had some stress related to having her in-law at her house.  In the area of affective symptoms, patient appears euthymic. Patient denies current suicidal ideation, intent, or plan. Patient denies current homicidal ideation, intent, or plan. Patient denies auditory hallucinations. Patient denies visual hallucinations. Patient denies symptoms of paranoia. Patient states sleep is good, with approximately 8.5 hours of sleep per night with a CPAP. Appetite is good, but she continues to consume junk food. Energy level is low. Patient endorses continued  symptoms of anhedonia. Patient denies hopelessness, helplessness, or guilt.   Denies any recent episodes consistent with mania, particularly decreased need for sleep with increased energy, grandiosity, impulsivity, hyperverbal and pressured speech, or increased productivity. Denies any recent symptoms consistent with psychosis, particularly auditory or visual hallucinations, thought broadcasting/insertion/withdrawal, or ideas of reference. Also denies excessive worry to the point of physical symptoms as well as any panic attacks. Denies any history of trauma or symptoms consistent with PTSD such as flashbacks, nightmares, hypervigilance, feelings of numbness or inability to connect with others.   Physical Exam  Vitals reviewed.  Constitutional: No distress.   History of Loss of Consciousness: No  Seizure History: No  Cardiac History: No  Allergies: Reviewed   Allergies   Allergen  Reactions   .   Relafen (Nabumetone)  Hives, Itching and Swelling   .  Sulfa Antibiotics     PCP: Harl Bowie   Current Medications: Reviewed  Current Outpatient Prescriptions on File Prior to Visit  Medication Sig Dispense Refill  . albuterol (PROVENTIL HFA;VENTOLIN HFA) 108 (90 BASE) MCG/ACT inhaler Inhale 2 puffs into the lungs every 6 (six) hours as needed. prn       . ampicillin (PRINCIPEN) 500 MG capsule Take 500 mg by mouth 2 (two) times daily. Uses only one time per day unless having a flair       . buPROPion (WELLBUTRIN XL) 150 MG 24 hr tablet Take 1 tablet (150 mg total) by mouth daily.  90 tablet  0  . Cholecalciferol (VITAMIN D) 2000 UNITS tablet Take 2,000 Units by mouth daily. Every other day       . fexofenadine (ALLEGRA) 180 MG tablet Take 180 mg by mouth daily.        . Fluticasone-Salmeterol (ADVAIR DISKUS) 100-50 MCG/DOSE AEPB Inhale 1 puff into the lungs every 12 (twelve) hours. Will use one time per day but if having a flair up will use two times per day        . L-Methylfolate (DEPLIN PO) Take 400 mcg by mouth 2 (two) times daily.      . modafinil (PROVIGIL) 200 MG tablet Take 200 mg by mouth daily. Take one and one half tablets per day       . mometasone (ASMANEX) 220 MCG/INH inhaler Inhale 1 puff into the lungs daily. Prescribed by Dr. Tempie Donning.      . montelukast (SINGULAIR) 10 MG tablet Take 10 mg by mouth at bedtime.        Marland Kitchen thyroid (ARMOUR) 30 MG tablet Take 30 mg by mouth every morning.       Marland Kitchen  topiramate (TOPAMAX) 50 MG tablet Take 75 mg by mouth at bedtime. 50 mg QAM, 100 QPM      . traMADol (ULTRAM) 50 MG tablet Take 50 mg by mouth every 6 (six) hours as needed. prn       . zolmitriptan (ZOMIG) 5 MG tablet Take 5 mg by mouth as needed. prn         Previous Psychotropic Medications: Reviewed  Patient has had a trial of the following psychotropic medications:  Wellbutrin 150 mg  Celexa 20-took for a couple years-sexual side  Effexor- couldn't stay away  Prozac-took for a  little while Patient is currently on the following psychotropic medications:  Wellbutrin    SUBSTANCE USE HISTORY:  Caffeine: Rare use  Nicotine: Patient denies.  Alcohol: Patient denies.  Illicit Drugs: Patient denies.   Blackouts: No  DT's: No  Withdrawal Symptoms: No None   FAMILY PSYCHIATRIC AND MEDICAL HISTORY: Reviewed Medical: MGM-Strokes, Biliary cirrhosis, Hypertension,hypothyroidism; MGF-stroke, MI; Mother-basal cell cancer, HTN; Father-colon cancer;  Psychiatric illness: Maternal Aunt-Mood disorder, Maternal great aunt-thought disorder  Substance abuse: Alcoholism: MGF, brother;  Suicides: Patient.   Mental Status Examination/Evaluation:  Objective: Appearance: Casual and Well Groomed   Eye Contact:: Good   Speech: Clear and Coherent and Normal Rate   Volume: Normal   Mood: "okay"   Affect: Appropriate and Full Range   Thought Process: Coherent, Intact and Loose   Orientation: Full   Thought Content: WDL   Suicidal Thoughts: No   Homicidal Thoughts: No   Judgement: Intact   Insight: Fair   Psychomotor Activity: Normal   Akathisia: No   Handed: Right   AIMS (if indicated): Not indicated   Assets: Communication Skills  Desire for Improvement  Financial Resources/Insurance  Housing  Intimacy  Vocational/Educational    Assessment:  AXIS I  Major Depressive Disorder, recurrent, moderate , Parent Child relational problem   AXIS II  No diagnosis   AXIS III  .  Subclinical hypothyroidism     .  Migraines     .  Asthma with allergic rhinitis     .  Sleep apnea syndrome     .  Elevated liver enzymes     .  Liver disease       Fatty liver   AXIS IV  problems with primary support group   AXIS V  51-60 moderate symptoms    PLAN:  1. Affirm with the patient that the medications are taken as ordered. Patient expressed understanding of how their medications were to be used.  2. Continue the following psychiatric medications as written prior to this appointment:   a) Continue Wellbutrin 150 mg. b) I have asked the patient to report any worsening of depressive symptoms, at which point we will increase bupropion.  3. Therapy: brief supportive therapy provided. Discussed her psychosocial stressors. Continue current services.  4. Risks and benefits, side effects and alternatives discussed with patient, he/she was given an opportunity to ask questions about his/her medication, illness, and treatment. All current psychiatric medications have been reviewed and discussed with the patient and adjusted as clinically appropriate. The patient has been provided an accurate and updated list of the medications being now prescribed.  5. Patient told to call clinic if any problems occur. Patient advised to go to ER if she should develop SI/HI, side effects, or if symptoms worsen. Has crisis numbers to call if needed.  6. No labs warranted at this time. As noted previously, the patient has asked  to test l-metlyfolate level and MTHFR. Will consider this if after resolving all stressors, an improvement in diet, and addition of exercise to her life routine, does not bring about resolution of her mood and fatigue.  7. The patient was encouraged to keep all PCP and specialty clinic appointments.  8. Patient was instructed to return to clinic in 2 months.  9. The patient expressed understanding of the plan outlined above and agrees with the plan.    There were no vitals filed for this visit. Jacqulyn Cane, MD

## 2011-09-04 ENCOUNTER — Encounter (HOSPITAL_COMMUNITY): Payer: Self-pay | Admitting: Psychiatry

## 2011-09-06 ENCOUNTER — Encounter (HOSPITAL_COMMUNITY): Payer: Self-pay | Admitting: Psychology

## 2011-09-06 ENCOUNTER — Ambulatory Visit (INDEPENDENT_AMBULATORY_CARE_PROVIDER_SITE_OTHER): Payer: BC Managed Care – PPO | Admitting: Psychology

## 2011-09-06 DIAGNOSIS — Z7189 Other specified counseling: Secondary | ICD-10-CM

## 2011-09-06 DIAGNOSIS — Z630 Problems in relationship with spouse or partner: Secondary | ICD-10-CM

## 2011-09-06 DIAGNOSIS — F331 Major depressive disorder, recurrent, moderate: Secondary | ICD-10-CM

## 2011-09-06 DIAGNOSIS — F411 Generalized anxiety disorder: Secondary | ICD-10-CM

## 2011-09-06 DIAGNOSIS — Z62820 Parent-biological child conflict: Secondary | ICD-10-CM

## 2011-09-06 DIAGNOSIS — F605 Obsessive-compulsive personality disorder: Secondary | ICD-10-CM

## 2011-09-06 DIAGNOSIS — F419 Anxiety disorder, unspecified: Secondary | ICD-10-CM

## 2011-09-06 NOTE — Progress Notes (Signed)
   THERAPIST PROGRESS NOTE  Session Time: 812- 904 am  Participation Level: Active  Behavioral Response: NeatAlertEuthymic  Type of Therapy: Individual Therapy  Treatment Goals addressed: Anxiety, Communication: communicating feelings and Coping  Interventions: Solution Focused, Strength-based, Psychosocial Skills: communication, coping, boundary setting and Supportive  Summary: Julia [name] is a 46 y.o. female who presents as pleasant but running late for her appointment. The patient shared details of her daughter Julia [name]'s graduation from high school but concentrated more on the upset caused by her husband's family than on her daughter's successful completion of high school.  I addressed my concern about the lack of boundaries both she and her husband have as related to his family and she admitted she knows this is true.  I suggested that she get a copy of the book Boundaries by [employer] and [place] and read with her husband.  I discussed the patient's responsibility in setting boundaries in her own life and in her home and suggested that this was an area of growth.  When asked she did comment that she was proud of her daughter but then had a negative comment about how she didn't think it would occur due to her number of absences.  The patient remarked that her younger daughter Julia [name] commented that her mother never sees anything positive about what she does.  I suggested that she was the only person that had control over this and did she want this to be what her children remembered about her; she commented that she did not and at times sat there like she was lost for words.  She is unable to articulate her feelings at all even when I attempt to coax them out of her.  The remainder of the visit was concentrated on the importance of changing her thoughts and re-teaching previously taught CBT skills.  I requested the patient work to make small sustainable gains in positive thinking and she is  willing to try and do so.  Suicidal/Homicidal: No  Plan: Return again in 2 weeks.  Diagnosis: Axis I: Major depressive disorder; GAD    Axis II: Obsessive- Compulsive Personality     [name], [facility] 09/06/2011

## 2011-09-15 NOTE — Telephone Encounter (Signed)
Erroneous encounter

## 2011-09-20 ENCOUNTER — Ambulatory Visit (INDEPENDENT_AMBULATORY_CARE_PROVIDER_SITE_OTHER): Payer: BC Managed Care – PPO | Admitting: Psychology

## 2011-09-20 ENCOUNTER — Encounter (HOSPITAL_COMMUNITY): Payer: Self-pay | Admitting: Psychology

## 2011-09-20 DIAGNOSIS — F411 Generalized anxiety disorder: Secondary | ICD-10-CM

## 2011-09-20 DIAGNOSIS — F331 Major depressive disorder, recurrent, moderate: Secondary | ICD-10-CM

## 2011-09-20 DIAGNOSIS — F419 Anxiety disorder, unspecified: Secondary | ICD-10-CM

## 2011-09-20 DIAGNOSIS — Z7189 Other specified counseling: Secondary | ICD-10-CM

## 2011-09-20 DIAGNOSIS — Z630 Problems in relationship with spouse or partner: Secondary | ICD-10-CM

## 2011-09-20 DIAGNOSIS — Z62820 Parent-biological child conflict: Secondary | ICD-10-CM

## 2011-09-20 NOTE — Progress Notes (Signed)
   THERAPIST PROGRESS NOTE  Session Time: 903-   Participation Level: Active  Behavioral Response: NeatAlertEuthymic  Type of Therapy: Individual Therapy  Treatment Goals addressed: Anxiety, Communication: with family and Coping  Interventions: Solution Focused, Strength-based, Psychosocial Skills: coping skills, communication and Supportive  Summary: Julia [name] is a 46 y.o. female who presents as pleasant and easily engaged.  She has been feeling overwhelmed with getting her oldest daughter ready to leave for college.  She is doing much of the work since her daughter is working at girl scout camp this summer and is away much of the time.  The patient, her husband and their two daughters will be driving to [place] to take her daughter to school.  She is excited about the trip because she has never been in that part of the country.  She doesn't have much feeling about leaving her daughter there since these past two years have been so difficult with her; she is relieved and she thinks somewhat sad.  She has enjoyed having her gone this summer and it has been a preview of how the home will run when she is gone for college.  She reports there is less contention in the home but that she and her husband still have issues they need to address.  She admits to living more like roommates but that she doesn't want it to be this way.  I suggested she consider having her younger daughter [name] stay in [place] so that it would provide she and her husband plenty of time to invest in each other on the drive back from [place].  I suspect the patient will not change the plans and that she might not yet be ready to deal with the couples issues.  She hasn't had the opportunity to give any additional injections at work and still has anxiety about this on a daily basis.  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  She assumed that they have more training than she and I  suggested based on a current nursing student's anxiety about this task that it doesn't appear.  The patient admits that she lacks confidence in her job but that she thinks she hides it well.  I suggested that her perception that she doesn't know enough is inaccurate given that only very bright students get in to and graduate pharmacy school.  I asked the patient to begin talking to herself when those critical thoughts enter and that she replace them with 'can do' thinking.  Suicidal/Homicidal: No  Plan: Return again in 2 weeks.  Diagnosis: Axis I: Major depressive disorder, recurrent; GAD    Axis II: Obsessive- Compulsive Personality     [name], [facility] 09/20/2011

## 2011-10-04 ENCOUNTER — Ambulatory Visit (INDEPENDENT_AMBULATORY_CARE_PROVIDER_SITE_OTHER): Payer: BC Managed Care – PPO | Admitting: Psychology

## 2011-10-04 DIAGNOSIS — F419 Anxiety disorder, unspecified: Secondary | ICD-10-CM

## 2011-10-04 DIAGNOSIS — F411 Generalized anxiety disorder: Secondary | ICD-10-CM

## 2011-10-04 DIAGNOSIS — F331 Major depressive disorder, recurrent, moderate: Secondary | ICD-10-CM

## 2011-10-10 ENCOUNTER — Encounter (HOSPITAL_COMMUNITY): Payer: Self-pay | Admitting: Psychology

## 2011-10-10 NOTE — Progress Notes (Signed)
   THERAPIST PROGRESS NOTE  Session Time: 905- 1000 am  Participation Level: Active  Behavioral Response: NeatAlertEuthymic  Type of Therapy: Individual Therapy  Treatment Goals addressed: Anxiety, Communication: in relationships and Coping  Interventions: Solution Focused, Strength-based, Psychosocial Skills: coping, communication and Supportive  Summary: Julia [name] is a 46 y.o. female who presents as pleasant and easily engaged.  She has been working to organize to drive her daughter across country to college in [place].  The patient is annoyed at her daughter's lack of commitment to completing the needed tasks and reports that she is leaving it up to her.  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  She reports that things at her home are much calmer since her daughter has been gone.  She is hopeful that she will be successful and has decided that if things don't work out in [place] that her daughter will need to find her own way back to [place] that she will not be facilitating her return.  The patient hasn't had warm and endearing feelings for her daughter in the past two years since her daughter has become more oppositional.  She would like to be able to express sadness that she is leaving but isn't really feeling that way.  She is looking forward to enjoying her younger daughter [name] and a home without much contention.  She is willing to consider that she and her husband might be able to work on their issues.  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 She has been second-guessing herself at work though she can't recall the last time she was written up for medication errors.  I suggested that she needed to work on staying focused and 'in the moment' so that she could feel more confident  about her work.  Suicidal/Homicidal: No  Plan: Return again in 3 weeks upon her return from [place].  Diagnosis: Axis I: Major depressive disorder, recurrent; GAD    Axis II: No diagnosis    [name], LPC 10/10/2011

## 2011-10-26 ENCOUNTER — Ambulatory Visit (INDEPENDENT_AMBULATORY_CARE_PROVIDER_SITE_OTHER): Payer: BC Managed Care – PPO | Admitting: Psychology

## 2011-10-26 DIAGNOSIS — F331 Major depressive disorder, recurrent, moderate: Secondary | ICD-10-CM

## 2011-10-26 DIAGNOSIS — F419 Anxiety disorder, unspecified: Secondary | ICD-10-CM

## 2011-10-26 DIAGNOSIS — F411 Generalized anxiety disorder: Secondary | ICD-10-CM

## 2011-11-01 ENCOUNTER — Encounter (HOSPITAL_COMMUNITY): Payer: Self-pay | Admitting: Psychology

## 2011-11-01 NOTE — Progress Notes (Signed)
   THERAPIST PROGRESS NOTE  Session Time: 205- 308 pm  Participation Level: Active  Behavioral Response: NeatAlertEuthymic  Type of Therapy: Individual Therapy  Treatment Goals addressed: Anger, Anxiety, Communication: in relationships and Coping  Interventions: Solution Focused, Strength-based, Psychosocial Skills: communication, coping, anger issues, dealing with change and Supportive  Summary: [name] is a 46 y.o. female who presents as pleasant and easily engaged.  She has returned from driving with her family across country to drop her oldest daughter at college in [place].  The patient reports the trip went well despite getting off to a late start because her daughter was not prepared.  She reports she doesn't miss her daughter and thinks that she is still too angry with her.  The patient reports that her daughter left a huge mess in her bedroom and that it still needs to be cleaned.  She has spoken with her daughter daily and the calls are going okay.  She reports she is getting a taste of what life will be like.  Her daughter is already having roommate issues and the roommate has not even moved in yet.  She thinks this will be a good learning experience and the patient continues to provide advice and support.  The patient is frustrated with her daughter that she has not yet gotten her financial aid completed despite her telling her exactly what needs to occur.  I suggested that she leave the topic alone since her daughter has done what she needed to do to get admitted to the college even though she waiting until the last minute to do everything.  The patient continues to deal with work issues.  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  This frustrates her because she already feels overwhelmed at work but she knows that eventually another will be hired.  I talked with the patient about  her need to focus on positive and take advantage of a calmer household since her main issue was interactions with her older daughter.  She enjoys younger daughter [name] and is looking forward to time with just her and her spouse.  Marital issues will need to be addressed, however it appears the patient has a difficult time committing to making change.  Suicidal/Homicidal: No  Plan: Return again in 2 weeks.  Diagnosis: Axis I: Major depressive disorder, recurrent: GAD    Axis II: No diagnosis    [name], [facility] 11/01/2011

## 2011-11-02 ENCOUNTER — Encounter (HOSPITAL_COMMUNITY): Payer: Self-pay | Admitting: Psychiatry

## 2011-11-02 ENCOUNTER — Ambulatory Visit (INDEPENDENT_AMBULATORY_CARE_PROVIDER_SITE_OTHER): Payer: BC Managed Care – PPO | Admitting: Psychiatry

## 2011-11-02 VITALS — BP 121/83 | HR 76 | Ht 65.25 in | Wt 191.0 lb

## 2011-11-02 DIAGNOSIS — F331 Major depressive disorder, recurrent, moderate: Secondary | ICD-10-CM

## 2011-11-02 DIAGNOSIS — F329 Major depressive disorder, single episode, unspecified: Secondary | ICD-10-CM

## 2011-11-02 MED ORDER — BUPROPION HCL ER (XL) 150 MG PO TB24: 150.0000 mg | ORAL_TABLET | Freq: Every day | ORAL | Status: DC

## 2011-11-02 NOTE — Progress Notes (Signed)
[facility]al Health Follow-up Outpatient Visit  Julia [name] [birth year]  Date: 11/02/2011  HPI Comments: HISTORY OF CURRENT ILLNESS:  Julia [name] is a 46 Y/o female with a past psychiatric history significant for Major Depressive Disorder, recurrent moderate. The patient is referred for psychiatric services for medication management.   The patient reports that she had to take her daughter to college in [place].  She reports the experience was stressful. The patient reports that she is not exercising, on a regular basis, thought her younger daughter does encourage her to walk.   In the area of affective symptoms, patient appears euthymic. Patient denies current suicidal ideation, intent, or plan. Patient denies current homicidal ideation, intent, or plan. Patient denies auditory hallucinations. Patient denies visual hallucinations. Patient denies symptoms of paranoia. Patient states sleep  appears to be non-restorative, with approximately 8.5 hours of sleep per night with a CPAP. Appetite is good, but she continues to consume junk food. Energy level is low. Patient endorses continued symptoms of anhedonia. Patient denies hopelessness, helplessness, or guilt.   Denies any recent episodes consistent with mania, particularly decreased need for sleep with increased energy, grandiosity, impulsivity, hyperverbal and pressured speech, or increased productivity. Denies any recent symptoms consistent with psychosis, particularly auditory or visual hallucinations, thought broadcasting/insertion/withdrawal, or ideas of reference. Also denies excessive worry to the point of physical symptoms as well as any panic attacks. Denies any history of trauma or symptoms consistent with PTSD such as flashbacks, nightmares, hypervigilance, feelings of numbness or inability to connect with others.   Physical Exam  Vitals reviewed.  Constitutional: No distress.   History of Loss of Consciousness: No    Seizure History: No  Cardiac History: No   Allergies: Reviewed  Allergies   Allergen  Reactions   .  Relafen (Nabumetone)  Hives, Itching and Swelling   .  Sulfa Antibiotics     PCP: Harl Bowie   Current Medications: Reviewed  Current Outpatient Prescriptions on File Prior to Visit  Medication Sig Dispense Refill  . albuterol (PROVENTIL HFA;VENTOLIN HFA) 108 (90 BASE) MCG/ACT inhaler Inhale 2 puffs into the lungs every 6 (six) hours as needed. prn       . ampicillin (PRINCIPEN) 500 MG capsule Take 500 mg by mouth 2 (two) times daily. Uses only one time per day unless having a flair       . buPROPion (WELLBUTRIN XL) 150 MG 24 hr tablet Take 1 tablet (150 mg total) by mouth daily.  90 tablet  0  . Cholecalciferol (VITAMIN D) 2000 UNITS tablet Take 2,000 Units by mouth daily. Every other day       . fexofenadine (ALLEGRA) 180 MG tablet Take 180 mg by mouth daily.        . Fluticasone-Salmeterol (ADVAIR DISKUS) 100-50 MCG/DOSE AEPB Inhale 1 puff into the lungs every 12 (twelve) hours. Will use one time per day but if having a flair up will use two times per day        . L-Methylfolate (DEPLIN PO) Take 400 mcg by mouth 2 (two) times daily.      . modafinil (PROVIGIL) 200 MG tablet Take 200 mg by mouth daily. Take one and one half tablets per day       . mometasone (ASMANEX) 220 MCG/INH inhaler Inhale 1 puff into the lungs daily. Prescribed by Dr. Tempie Donning.      . thyroid (ARMOUR) 30 MG tablet Take 30 mg by mouth every morning.       Marland Kitchen  topiramate (TOPAMAX) 50 MG tablet Take 75 mg by mouth at bedtime. 50 mg QAM, 100 QPM      . zolmitriptan (ZOMIG) 5 MG tablet Take 5 mg by mouth as needed. prn          Previous Psychotropic Medications: Reviewed  Patient has had a trial of the following psychotropic medications:  Wellbutrin 150 mg  Celexa 20-took for a couple years-sexual side  Effexor- couldn't stay away  Prozac-took for a little while  Patient is currently on the following  psychotropic medications:  Wellbutrin   SUBSTANCE USE HISTORY:  Caffeine: Rare use  Nicotine: Patient denies.  Alcohol: Patient denies.  Illicit Drugs: Patient denies.   Blackouts: No  DT's: No  Withdrawal Symptoms: No None   FAMILY PSYCHIATRIC AND MEDICAL HISTORY: Reviewed  Medical: MGM-Strokes, Biliary cirrhosis, Hypertension,hypothyroidism; MGF-stroke, MI; Mother-basal cell cancer, HTN; Father-colon cancer;  Psychiatric illness: Maternal Aunt-Mood disorder, Maternal great aunt-thought disorder  Substance abuse: Alcoholism: MGF, brother;  Suicides: Patient.   Mental Status Examination/Evaluation:  Objective: Appearance: Casual and Well Groomed   Eye Contact:: Good   Speech: Clear and Coherent and Normal Rate   Volume: Normal   Mood: "okay"   Affect: Appropriate and Full Range   Thought Process: Coherent, Intact and Loose   Orientation: Full   Thought Content: WDL   Suicidal Thoughts: No   Homicidal Thoughts: No   Judgement: Intact   Insight: Fair   Psychomotor Activity: Normal   Akathisia: No   Handed: Right   Memory: 3/3 Immediate; 2/3 recent.  Assets: Communication Skills  Desire for Improvement  Financial Resources/Insurance  Housing  Intimacy  Vocational/Educational    Assessment:  AXIS I  Major Depressive Disorder, recurrent, moderate , Parent Child relational problem   AXIS II  No diagnosis   AXIS III  .  Subclinical hypothyroidism     .  Migraines     .  Asthma with allergic rhinitis     .  Sleep apnea syndrome     .  Elevated liver enzymes     .  Liver disease       Fatty liver   AXIS IV  problems with primary support group   AXIS V  51-60 moderate symptoms     PLAN:  1. Affirm with the patient that the medications are taken as ordered. Patient expressed understanding of how their medications were to be used.  2. Continue the following psychiatric medications as written prior to this appointment:  a) Continue Wellbutrin 150 mg. Patient does not  want to increase the medication at this time.  b) I have asked the patient to report any worsening of depressive symptoms, at which point we will increase bupropion.  3. Therapy: brief supportive therapy provided. Discussed her psychosocial stressors. Continue individual therapy.  4. Risks and benefits, side effects and alternatives discussed with patient, he/she was given an opportunity to ask questions about his/her medication, illness, and treatment. All current psychiatric medications have been reviewed and discussed with the patient and adjusted as clinically appropriate. The patient has been provided an accurate and updated list of the medications being now prescribed.  5. Patient told to call clinic if any problems occur. Patient advised to go to ER if she should develop SI/HI, side effects, or if symptoms worsen. Has crisis numbers to call if needed.  6. No labs warranted at this time. As noted previously, the patient has asked to test l-metlyfolate level and MTHFR. Will consider this if  after resolving all stressors, an improvement in diet, and addition of exercise to her life routine, does not bring about resolution of her mood and fatigue.  7. The patient was encouraged to keep all PCP and specialty clinic appointments.  8. Patient was instructed to return to clinic in 2 months.  9. The patient expressed understanding of the plan outlined above and agrees with the plan.    Jacqulyn Cane, MD

## 2011-11-15 ENCOUNTER — Encounter (HOSPITAL_COMMUNITY): Payer: Self-pay | Admitting: Psychology

## 2011-11-15 ENCOUNTER — Ambulatory Visit (INDEPENDENT_AMBULATORY_CARE_PROVIDER_SITE_OTHER): Payer: BC Managed Care – PPO | Admitting: Psychology

## 2011-11-15 DIAGNOSIS — F331 Major depressive disorder, recurrent, moderate: Secondary | ICD-10-CM

## 2011-11-15 NOTE — Progress Notes (Signed)
THERAPIST PROGRESS NOTE  Session Time: 1001- 1105 am  Participation Level: Active  Behavioral Response: NeatAlertEuthymic  Type of Therapy: Individual Therapy  Treatment Goals addressed: Anxiety, Communication: in relationships, communicating feelings and Coping  Interventions: Solution Focused, Strength-based, Psychosocial Skills: coping, communicating feelings, getting rid of built up anger and Supportive  Summary: Denzil Magana is a 46 y.o. female who presents as pleasant and easily engaged.  She appears calm and relaxed.  She is feeling frustrated with her college-aged daughter [name] because she hasn't paid her bill as directed by her mother about a month ago.  She talked to her last night and she recognizes that carries a lot of anger toward her for making life so difficult over the past year.  I suggested that she could decide to respond empathetically and support instead of lecture and 'I told you so'.  She admits that she does tend to focus on what [name] has done wrong and that she expects her phone calls will often be negative.  Her daughter is having a poor roommate experience and the patient told her how to deal with this but she doesn't do so.  She doesn't know if she has paid the bill or not and doesn't know if she is going to get herself kicked out of college for not paying the bill.  I talked with the patient about two things: 1- it is her daughter's responsibility to pay the bill and face the consequence if not paying the bill (which she agrees), and 2- what will she and her husband do if she wants to return home.  The patient expects if she is to return home that she will first get her own ticket paid for.  She also doesn't want her to live in the family home but thinks her husband will not agree.  I suggested that she and her husband talk about potential scenarios and decide how they will manage.  The patient is noticeably more relaxed and I commented on this; she admits  to feeling more relaxed.  I suggested that now she cannot put her issues off on her daughter since she has very little impact on the home and the patient's mood.  I suggested it was time to deal with her issues that appear to be remote; she thinks the issues date back to her being bullied, however she can only recall once instance.  Upon further investigation she denied that she was impacted.  She reports her father would call her stupid but that he called everyone this and doesn't feel this particularly impacted her either.  I asked her to consider when she was the most emotional and she admits during the time she dated and married her husband.  She felt very connected to him and he to her.  She thinks the arrival of the children was overwhelming for her and she cared much more for the children's needs than her own or her spouses.  The patient and her spouse have a good sexual connection and she admits that they still have an active sex life and that she does feel connected to him at those times.  She doesn't feel she has enough time to date her husband though this is the challenge I have provided.  I suggested that she determine what day of the week would be best for 'date night' and that she actively engage her husband in this; she is willing to commit to this. I also suggested that she be sure to  get time for herself daily to weekly as well.  I asked that she put self-care and date night into her calendar as an appointment.  I also requested that she invite her husband to a session and schedule it at a time he can attend. I think it would be prudent to see how they interact verbally and non-verbally and ask for his feedback on the patient's emotional health and progress since engaging therapy.  Suicidal/Homicidal: No  Plan: Return again in 2-3 weeks.  Diagnosis: Axis I: Major depressive disorder, recurrent; Anxiety    Axis II: No diagnosis    [name], [facility] 11/15/2011

## 2011-11-29 ENCOUNTER — Ambulatory Visit (INDEPENDENT_AMBULATORY_CARE_PROVIDER_SITE_OTHER): Payer: BC Managed Care – PPO | Admitting: Psychology

## 2011-11-29 DIAGNOSIS — F419 Anxiety disorder, unspecified: Secondary | ICD-10-CM

## 2011-11-29 DIAGNOSIS — F411 Generalized anxiety disorder: Secondary | ICD-10-CM

## 2011-11-29 DIAGNOSIS — F331 Major depressive disorder, recurrent, moderate: Secondary | ICD-10-CM

## 2011-12-01 ENCOUNTER — Encounter (HOSPITAL_COMMUNITY): Payer: Self-pay | Admitting: Psychology

## 2011-12-01 NOTE — Progress Notes (Signed)
THERAPIST PROGRESS NOTE  Session Time: 805- 905 am  Participation Level: Active  Behavioral Response: NeatAlertEuthymic  Type of Therapy: Family Therapy  Treatment Goals addressed: Communication: in relationship and Coping  Interventions: Solution Focused, Strength-based, Psychosocial Skills: marital issues, communication and Supportive  Summary: Julia [name] is a 46 y.o. female who presents with her husband Fayrene Fearing.  The patient appeared anxious with her husband in the session.  I thanked Mr. Kluk for coming to the session and told him that I was interested in learning if he sees any progress since his wife has been in treatment.  I also suggested I was interested in talking about his perceptions about their relationship and his willingness to do anything differently in the marriage.  The interaction with Mr. Maleki was strained and he appeared heavily guarded.  He admitted he didn't want to come but is here.  He admits to internalizing his feelings and doesn't see this changing.  He commented that 'it is what it is' when asked about his marriage and states that they have each done their own thing for many years and that he's given up on the idea that things could be different.  He is not willing to deal with the marital issues. The couple both agree that the house is very quiet and they are in their own space doing their own thing.  He thinks his wife has made some positive changes in how she was interacting with their oldest daughter [name].  He commented that he knew when [name] went off to school that he would be the next thing to be talked about in therapy and his wife and I both commented that he has always been a topic in therapy.  Mr. Effinger had nothing else to contribute and I told him he could stay for the remainder of the session or he could leave if he wanted too; he awkwardly left the session.  I talked with the patient about her experience and she admits that he did not want  to be there and tried to avoid it but had no reason to; she was surprised that he came.  She wasn't surprised about his interactions in session.  I suggested that she might want to consider if co-existing with her husband is what she wanted for her life.  She reports he has more conversation with the several women from high school than he ever has with her and it has been this way for years.  She is unsure about her future and appears unsure of what to talk about regarding her marital situation. I suggested that perhaps the session gave her husband some things to think about and that maybe he will decided to consider working on their marriage.  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  She feels insecure doing so but has no choice; she is constantly checking the educational information for fear that she will do something wrong and takes a long time to give an injection.  I asked some questions about the various injections and she was able to provide me with information; I commented that she was able to recite important information to me and perhaps she knew more than she was giving herself credit.  She is heading to the family cabin to do some work and will have some time alone there.  She doesn't see this as an opportunity for relaxation but only equates it as work; I suggested she consider this  to be some time for her to enjoy the quiet of nature, the breeze and the warm sunshine.  The patient then commented that would be nice if the new neighbors weren't there and being obnoxious.  She still struggles significantly with seeing anything from a positive light.  Suicidal/Homicidal: No  Plan: Patient reports she will call to reschedule.  Diagnosis: Axis I: Generalized Anxiety DisorderMajor depressive disorder, recurrent    Axis II: Deferred    [name], [facility] 12/01/2011

## 2012-01-03 ENCOUNTER — Ambulatory Visit (INDEPENDENT_AMBULATORY_CARE_PROVIDER_SITE_OTHER): Payer: BC Managed Care – PPO | Admitting: Psychiatry

## 2012-01-03 ENCOUNTER — Encounter (HOSPITAL_COMMUNITY): Payer: Self-pay | Admitting: Psychiatry

## 2012-01-03 VITALS — BP 123/88 | HR 70 | Ht 65.25 in | Wt 188.0 lb

## 2012-01-03 DIAGNOSIS — F329 Major depressive disorder, single episode, unspecified: Secondary | ICD-10-CM

## 2012-01-03 DIAGNOSIS — F321 Major depressive disorder, single episode, moderate: Secondary | ICD-10-CM

## 2012-01-03 DIAGNOSIS — Z62820 Parent-biological child conflict: Secondary | ICD-10-CM

## 2012-01-03 NOTE — Progress Notes (Signed)
[facility] Behavioral Health Follow-up Outpatient Visit  Julia [name] 09-Oct-1965  Date: 01/03/2012  HPI Comments: HISTORY OF CURRENT ILLNESS:  Ms. Turbyfill is a 46 Y/o female with a past psychiatric history significant for Major Depressive Disorder, recurrent moderate. The patient is referred for psychiatric services for medication management.   The patient reports that her older daughter is dong fairly well and adjusting in college. She has had a therapy session with her husband.  She reports that she and husband are still not communicating and states this is a significant stressor in her life, but does not know what to do to improve her situation.  In the area of affective symptoms, patient appears euthymic. Patient denies current suicidal ideation, intent, or plan. Patient denies current homicidal ideation, intent, or plan. Patient denies auditory hallucinations. Patient denies visual hallucinations. Patient denies symptoms of paranoia. Patient states sleep appears to be non-restorative, with approximately 8 hours of sleep per night with a CPAP. Appetite is good, but she continues to consume junk food. Energy level is low. Patient endorses continued symptoms of anhedonia. Patient denies hopelessness, helplessness, or guilt.   Denies any recent episodes consistent with mania, particularly decreased need for sleep with increased energy, grandiosity, impulsivity, hyperverbal and pressured speech, or increased productivity. Denies any recent symptoms consistent with psychosis, particularly auditory or visual hallucinations, thought broadcasting/insertion/withdrawal, or ideas of reference. Also denies excessive worry to the point of physical symptoms as well as any panic attacks. Denies any history of trauma or symptoms consistent with PTSD such as flashbacks, nightmares, hypervigilance, feelings of numbness or inability to connect with others.   Review of Systems  Constitutional: Positive for fatigue.   Respiratory: Negative for chest tightness and shortness of breath.  Cardiovascular: Negative Psychiatric/Behavioral: Positive for sleep disturbance and decreased concentration.   Filed Vitals:   01/03/12 1150  BP: 123/88  Pulse: 70  Height: 5' 5.25" (1.657 m)  Weight: 188 lb (85.276 kg)   Physical Exam  Vitals reviewed.  Constitutional: No distress.   History of Loss of Consciousness: No  Seizure History: No  Cardiac History: No   Allergies: Reviewed  Allergies   Allergen  Reactions   .  Relafen (Nabumetone)  Hives, Itching and Swelling   .  Sulfa Antibiotics     PCP: Harl Bowie   Current Medications: Reviewed  Current Outpatient Prescriptions on File Prior to Visit  Medication Sig Dispense Refill  . albuterol (PROVENTIL HFA;VENTOLIN HFA) 108 (90 BASE) MCG/ACT inhaler Inhale 2 puffs into the lungs every 6 (six) hours as needed. prn       . ampicillin (PRINCIPEN) 500 MG capsule Take 500 mg by mouth 2 (two) times daily. Uses only one time per day unless having a flair       . buPROPion (WELLBUTRIN XL) 150 MG 24 hr tablet Take 1 tablet (150 mg total) by mouth daily.  90 tablet  0  . Cholecalciferol (VITAMIN D) 2000 UNITS tablet Take 2,000 Units by mouth daily. Every other day       . fexofenadine (ALLEGRA) 180 MG tablet Take 180 mg by mouth daily.        . Fluticasone-Salmeterol (ADVAIR DISKUS) 100-50 MCG/DOSE AEPB Inhale 1 puff into the lungs every 12 (twelve) hours. Will use one time per day but if having a flair up will use two times per day        . L-Methylfolate (DEPLIN PO) Take 400 mcg by mouth 2 (two) times daily.      Marland Kitchen  metaxalone (SKELAXIN) 800 MG tablet Take 800 mg by mouth as needed.      . modafinil (PROVIGIL) 200 MG tablet Take 200 mg by mouth daily. Take one and one half tablets per day       . omeprazole (PRILOSEC) 40 MG capsule Take 40 mg by mouth Daily.      Marland Kitchen thyroid (ARMOUR) 30 MG tablet Take 30 mg by mouth every morning.       . topiramate (TOPAMAX) 50  MG tablet Take 75 mg by mouth at bedtime. 50 mg QAM, 100 QPM      . zolmitriptan (ZOMIG) 5 MG tablet Take 5 mg by mouth as needed. prn       . mometasone (ASMANEX) 220 MCG/INH inhaler Inhale 1 puff into the lungs daily. Prescribed by Dr. Tempie Donning.        Previous Psychotropic Medications: Reviewed  Patient has had a trial of the following psychotropic medications:  Wellbutrin 150 mg  Celexa 20-took for a couple years-sexual side  Effexor- couldn't stay away  Prozac-took for a little while  Patient is currently on the following psychotropic medications:  Wellbutrin   SUBSTANCE USE HISTORY:  Caffeine: Rare use  Nicotine: Patient denies.  Alcohol: Patient denies.  Illicit Drugs: Patient denies.   Blackouts: No  DT's: No  Withdrawal Symptoms: No None   FAMILY PSYCHIATRIC AND MEDICAL HISTORY: Reviewed  Medical: MGM-Strokes, Biliary cirrhosis, Hypertension,hypothyroidism; MGF-stroke, MI; Mother-basal cell cancer, HTN; Father-colon cancer;  Psychiatric illness: Maternal Aunt-Mood disorder, Maternal great aunt-thought disorder  Substance abuse: Alcoholism: MGF, brother;  Suicides: Patient.   Mental Status Examination/Evaluation:  Objective: Appearance: Casual and Well Groomed   Eye Contact:: Good   Speech: Clear and Coherent and Normal Rate   Volume: Normal   Mood: "normal"   Affect: Appropriate and Full Range   Thought Process: Coherent, Intact and Loose   Orientation: Full   Thought Content: WDL   Suicidal Thoughts: No   Homicidal Thoughts: No   Judgement: Intact   Insight: Fair   Psychomotor Activity: Normal   Akathisia: No   Handed: Right   Memory: 3/3 Immediate; 2/3 recent.   Assets: Communication Skills  Desire for Improvement  Financial Resources/Insurance  Housing  Intimacy  Vocational/Educational    Assessment:  AXIS I  Major Depressive Disorder, recurrent, moderate , Parent Child relational problem   AXIS II  No diagnosis   AXIS III  .  Subclinical  hypothyroidism     .  Migraines     .  Asthma with allergic rhinitis     .  Sleep apnea syndrome     .  Elevated liver enzymes     .  Liver disease       Fatty liver   AXIS IV  problems with primary support group   AXIS V  51-60 moderate symptoms    PLAN:  1. Affirm with the patient that the medications are taken as ordered. Patient expressed understanding of how their medications were to be used.  2. Continue the following psychiatric medications as written prior to this appointment:  a) Continue Wellbutrin 150 mg. Patient does not want to increase wellbutrin b) I have asked the patient to report any worsening of depressive symptoms, at which point we will increase bupropion.  C) The patient has been on an SSRI before (celexa, zoloft, effexor) had some side effects.  3. Therapy: brief supportive therapy provided. Discussed her psychosocial stressors. Continue individual therapy.  Encouraged restarting couples counseling. 4.  Risks and benefits, side effects and alternatives discussed with patient, she was given an opportunity to ask questions about her medication, illness, and treatment. All current psychiatric medications have been reviewed and discussed with the patient and adjusted as clinically appropriate. The patient has been provided an accurate and updated list of the medications being now prescribed.  5. Patient told to call clinic if any problems occur. Patient advised to go to ER if she should develop SI/HI, side effects, or if symptoms worsen. Has crisis numbers to call if needed.  6. No labs warranted at this time. 7. The patient was encouraged to keep all PCP and specialty clinic appointments.  8. Patient was instructed to return to clinic in 3 months.  9. The patient expressed understanding of the plan outlined above and agrees with the plan.    Jacqulyn Cane, MD

## 2012-01-16 ENCOUNTER — Ambulatory Visit (INDEPENDENT_AMBULATORY_CARE_PROVIDER_SITE_OTHER): Payer: BC Managed Care – PPO | Admitting: Psychology

## 2012-01-16 DIAGNOSIS — F411 Generalized anxiety disorder: Secondary | ICD-10-CM

## 2012-01-16 DIAGNOSIS — F419 Anxiety disorder, unspecified: Secondary | ICD-10-CM

## 2012-01-16 DIAGNOSIS — F331 Major depressive disorder, recurrent, moderate: Secondary | ICD-10-CM

## 2012-01-16 NOTE — Progress Notes (Signed)
   THERAPIST PROGRESS NOTE  Session Time: 859- 954 am  Participation Level: Active  Behavioral Response: NeatAlertEuthymic  Type of Therapy: Individual Therapy  Treatment Goals addressed: Anxiety and Communication: thoughts and feelings  Interventions: Solution Focused, Strength-based, Psychosocial Skills: coping, communication and Supportive  Summary: Julia [name] is a 46 y.o. female who presents as pleasant and easily engaged.  She is in a bright mood and reports she is well.  Her college freshman [name] is doing well and she provided several examples of how it is obvious that she had been listening to her through the years.  She is pleased that she has been saving her money and has enough to pay for the cost of her dorm over the thanksgiving and christmas holiday since [place]. The patient appears unaffected that her daughter is not coming home for the holidays and expresses no feelings of sadness or regret that this is not occurring.  She admits that if this were her younger daughter [name] that she believes it would be a different story since she and [name] share a love for Christmas. She admittedly has a closer bond to [name] who was a much easier and more agreeable child.  She also sees [name] as a much more vulnerable and innocent child who would struggle understanding the real world and doesn't know if she would venture so far away to college.  She reports that things are more peaceful in her home since [name] has been away.  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 She and her husband experience less contention when [name] is away.   Suicidal/Homicidal: No  Plan: Return again in 2 weeks.  Diagnosis: Axis I: Generalized Anxiety Disorder and Major depressive disorder, recurrent, moderate    Axis II: No diagnosis    [name], [facility] 01/16/2012

## 2012-01-24 ENCOUNTER — Encounter (HOSPITAL_COMMUNITY): Payer: Self-pay | Admitting: Psychology

## 2012-01-30 ENCOUNTER — Encounter (HOSPITAL_COMMUNITY): Payer: Self-pay | Admitting: Psychology

## 2012-01-30 ENCOUNTER — Ambulatory Visit (INDEPENDENT_AMBULATORY_CARE_PROVIDER_SITE_OTHER): Payer: BC Managed Care – PPO | Admitting: Psychology

## 2012-01-30 DIAGNOSIS — F411 Generalized anxiety disorder: Secondary | ICD-10-CM

## 2012-01-30 DIAGNOSIS — F419 Anxiety disorder, unspecified: Secondary | ICD-10-CM

## 2012-01-30 DIAGNOSIS — F331 Major depressive disorder, recurrent, moderate: Secondary | ICD-10-CM

## 2012-01-30 NOTE — Progress Notes (Signed)
   THERAPIST PROGRESS NOTE  Session Time: 1008- 1100 am  Participation Level: Active  Behavioral Response: NeatAlertEuthymic  Type of Therapy: Individual Therapy  Treatment Goals addressed: Anger, Anxiety, Communication: in relationships, thoughts and  feelings and Coping  Interventions: Solution Focused, Strength-based, Psychosocial Skills: coping, marital, communication, desire to make changes and Supportive  Summary: Julia [name] is a 46 y.o. female who presents as pleasant and easily engaged.  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 The patient is somewhat sarcastic in the conversation about this as if she expected this was going to occur; she really has a difficult time being positive about her daughter [name].  She is generally negative about everything and when asked about how she wishes to spend her therapy time she switches gears to complain about her husband's family.  I confronted her on focusing on other people's issues and not her own.  She is not sure what she wants in therapy and admits that she was thinking about this herself prior to coming to this visit.  I suggested that we could meet as we had been but unless she did something different outside this office that nothing was going to change and that continually lamenting all the things that are wrong without making any effort to change was not helpful.  She admits that she is unsure how she wishes to use therapy.  She appears avoidant and I suspect scared to address her own issues.  I talked with the patient about the importance of self care and she reports she doesn't have the time for this.  I suggested that her lack of self care wasn't helping her to get things done so that perhaps actually taking time for herself would provide her the energy to take care of  the necessary tasks.  She kept talking about how busy she was but she works less than full time.  When we evaluated things by necessary tasks such as cooking, cleaning, grocery shopping, and personal hygiene the patient still have over 58 hours of time left to do any other tasks that needed completed including managing bills, insurance issues, self care (naps, reading, crafts, baking). She is surprised by this and really appears to reject that this can be true.  I suggested that perhaps she spends so much time spinning about what she has to do that she is wasting the time she does have and she admits this very well could be true.   Suicidal/Homicidal: No  Plan: Return again in 2-3 weeks. I suggested the patient create a daily calendar to use to help her structure her time including self-care activities.  Diagnosis: Axis I: Generalized Anxiety Disorder and Major depressive disorder, recurrent    Axis II: No diagnosis    [name], [facility] 01/30/2012

## 2012-07-24 ENCOUNTER — Encounter (HOSPITAL_COMMUNITY): Payer: Self-pay | Admitting: Psychiatry

## 2012-07-24 ENCOUNTER — Ambulatory Visit (INDEPENDENT_AMBULATORY_CARE_PROVIDER_SITE_OTHER): Payer: BC Managed Care – PPO | Admitting: Psychiatry

## 2012-07-24 VITALS — BP 126/85 | HR 89 | Ht 65.25 in | Wt 166.0 lb

## 2012-07-24 DIAGNOSIS — F329 Major depressive disorder, single episode, unspecified: Secondary | ICD-10-CM

## 2012-07-24 DIAGNOSIS — Z62820 Parent-biological child conflict: Secondary | ICD-10-CM

## 2012-07-24 DIAGNOSIS — F331 Major depressive disorder, recurrent, moderate: Secondary | ICD-10-CM

## 2012-07-24 MED ORDER — BUPROPION HCL ER (XL) 150 MG PO TB24: 150.0000 mg | ORAL_TABLET | Freq: Every day | ORAL | Status: DC

## 2012-07-24 NOTE — Progress Notes (Signed)
[facility] Behavioral Health Follow-up Outpatient Visit   Julia [name] May 16, 1965  Date: 07/24/2012  HPI Comments: HISTORY OF CURRENT ILLNESS:  Julia [name] is a 47 Y/o female with a past psychiatric history significant for Major Depressive Disorder, recurrent moderate. The patient is referred for psychiatric services for medication management.   The patient reports she has started a paleodiet and has lost 22 lbs. She states that she has been started on natural foods, including 5-HTP.  She has some concerns about her oldest daughter who is in college and her youngest daughter who will be going to college next year.  Her older daughter has decided to stay at school this summer and work but is having some difficulties with a roommate. She asks about going back to her PCP for medications. She reports continued marital stressors but does not see any resolution to this problem and reports that her husband would not be willing to come in for couple's counseling.  The patient reports she is taking her medication and denies  any side effects  In the area of affective symptoms, patient appears euthymic. Patient denies current suicidal ideation, intent, or plan. Patient denies current homicidal ideation, intent, or plan. Patient denies auditory hallucinations. Patient denies visual hallucinations. Patient denies symptoms of paranoia. Patient states sleep appears to be non-restorative, with approximately 8 hours of sleep per night with a CPAP. Appetite is good. Energy level is slightly improved. Patient endorses some symptoms of anhedonia. Patient denies hopelessness, helplessness, or guilt.   Denies any recent episodes consistent with mania, particularly decreased need for sleep with increased energy, grandiosity, impulsivity, hyperverbal and pressured speech, or increased productivity. Denies any recent symptoms consistent with psychosis, particularly auditory or visual hallucinations, thought  broadcasting/insertion/withdrawal, or ideas of reference. Also denies excessive worry to the point of physical symptoms as well as any panic attacks. Denies any history of trauma or symptoms consistent with PTSD such as flashbacks, nightmares, hypervigilance, feelings of numbness or inability to connect with others.   Review of Systems  Constitutional: Negative for fever, chills, weight loss and diaphoresis.  Respiratory: Negative for cough, sputum production, shortness of breath and wheezing.   Cardiovascular: Negative for chest pain, palpitations and leg swelling.  Gastrointestinal: Positive for nausea (with ketoconazole) and blood in stool (Secondary to hemorrhoids.). Negative for heartburn, vomiting, abdominal pain, diarrhea and constipation.   Filed Vitals:   07/24/12 1029  BP: 126/85  Pulse: 89  Height: 5' 5.25" (1.657 m)  Weight: 166 lb (75.297 kg)   Physical Exam  Vitals reviewed.  Constitutional: No distress.  Strength & Muscle Tone: normal Gait & Station: normal Patient leans: N/A  History of Loss of Consciousness: No  Seizure History: No  Cardiac History: No   Allergies: Reviewed  Allergies   Allergen  Reactions   .  Relafen (Nabumetone)  Hives, Itching and Swelling   .  Sulfa Antibiotics     PCP: Harl Bowie   Current Medications: Reviewed  Current Outpatient Prescriptions on File Prior to Visit  Medication Sig Dispense Refill  . albuterol (PROVENTIL HFA;VENTOLIN HFA) 108 (90 BASE) MCG/ACT inhaler Inhale 2 puffs into the lungs every 6 (six) hours as needed. prn       . Cholecalciferol (VITAMIN D) 2000 UNITS tablet Take 2,000 Units by mouth.       . fexofenadine (ALLEGRA) 180 MG tablet Take 180 mg by mouth daily.        Marland Kitchen L-Methylfolate (DEPLIN PO) Take 400 mcg by mouth 2 (two) times  daily.      . metaxalone (SKELAXIN) 800 MG tablet Take 800 mg by mouth as needed.      . modafinil (PROVIGIL) 200 MG tablet Take 200 mg by mouth daily. Take one and one half tablets  per day       . mometasone (ASMANEX) 220 MCG/INH inhaler Inhale 1 puff into the lungs daily. Prescribed by Dr. Tempie Donning.      . montelukast (SINGULAIR) 10 MG tablet Take 10 mg by mouth Daily.      Marland Kitchen thyroid (ARMOUR) 30 MG tablet Take 60 mg by mouth every morning.       . topiramate (TOPAMAX) 50 MG tablet Take 75 mg by mouth at bedtime. 50 mg QAM, 100 QPM      . zolmitriptan (ZOMIG) 5 MG tablet Take 5 mg by mouth as needed. prn        No current facility-administered medications on file prior to visit.   buPROPion (WELLBUTRIN XL) 150 MG 24 hr tablet Take 1 tablet (150 mg total) by mouth daily.  90 tablet  0    Previous Psychotropic Medications: Reviewed  Patient has had a trial of the following psychotropic medications:  Wellbutrin 150 mg  Celexa 20-took for a couple years-sexual side  Effexor- couldn't stay away  Prozac-took for a little while  Patient is currently on the following psychotropic medications:  Wellbutrin   SUBSTANCE USE HISTORY:  Caffeine: Rare use  Nicotine: Patient denies.  Alcohol: Patient denies.  Illicit Drugs: Patient denies.   Blackouts: No  DT's: No  Withdrawal Symptoms: No None   FAMILY PSYCHIATRIC AND MEDICAL HISTORY: Reviewed  Family History  Problem Relation Age of Onset  . Alcohol abuse Father   . Alcohol abuse Brother   . Coronary artery disease Maternal Grandmother   . Transient ischemic attack Maternal Grandmother   . Coronary artery disease Maternal Grandfather   . Heart attack Maternal Grandfather   . Transient ischemic attack Paternal Grandfather     Mental Status Examination/Evaluation:  Objective: Appearance: Casual and Well Groomed   Eye Contact:: Good   Speech: Clear and Coherent and Normal Rate   Volume: Normal   Mood: "still tired" (4-6/10  (0=Very depressed; 5=Neutral; 10=Very Happy)  Affect: Appropriate and Full Range   Thought Process: Coherent, Intact and Loose   Orientation: Full   Thought Content: WDL   Suicidal  Thoughts: No   Homicidal Thoughts: No   Judgement: Intact   Insight: Fair   Psychomotor Activity: Normal   Akathisia: No   Handed: Right   Memory: 3/3 Immediate; 2/3 recent.   Assets: Communication Skills  Desire for Improvement  Financial Resources/Insurance  Housing  Intimacy  Vocational/Educational    Assessment:  AXIS I  Major Depressive Disorder, recurrent, moderate , Parent Child relational problem   AXIS II  No diagnosis   AXIS III  .  Subclinical hypothyroidism     .  Migraines     .  Asthma with allergic rhinitis     .  Sleep apnea syndrome     .  Elevated liver enzymes     .  Liver disease       Fatty liver   AXIS IV  problems with primary support group   AXIS V  GAF: 60 moderate symptoms    PLAN:  1. Affirm with the patient that the medications are taken as ordered. Patient expressed understanding of how their medications were to be used.  2. Continue the following  psychiatric medications as written prior to this appointment:  a) Continue Wellbutrin 150 mg. Patient does not want to increase wellbutrin b) I have asked the patient to report any worsening of depressive symptoms, at which point we will increase bupropion.  C) The patient has been on an SSRI before (celexa, zoloft, effexor) had some side effects.  3. Therapy: brief supportive therapy provided. Discussed her psychosocial stressors. Continue individual therapy.  Encouraged restarting couples counseling. 4. Risks and benefits, side effects and alternatives discussed with patient, she was given an opportunity to ask questions about her medication, illness, and treatment. All current psychiatric medications have been reviewed and discussed with the patient and adjusted as clinically appropriate. The patient has been provided an accurate and updated list of the medications being now prescribed.  5. Patient told to call clinic if any problems occur. Patient advised to go to ER if she should develop SI/HI, side  effects, or if symptoms worsen. Has crisis numbers to call if needed.  6. No labs warranted at this time. 7. The patient was encouraged to keep all PCP and specialty clinic appointments.  8. Patient was instructed to return to clinic in 6  months.  As patient wishes to continue follow up with PCP will do so if no worsening of depression or need for medication change in 6 months.  9. The patient expressed understanding of the plan outlined above and agrees with the plan.

## 2013-01-16 ENCOUNTER — Encounter: Payer: Self-pay | Admitting: Family Medicine

## 2013-01-16 ENCOUNTER — Ambulatory Visit (INDEPENDENT_AMBULATORY_CARE_PROVIDER_SITE_OTHER): Payer: BC Managed Care – PPO | Admitting: Family Medicine

## 2013-01-16 VITALS — BP 112/78 | HR 72 | Wt 150.0 lb

## 2013-01-16 DIAGNOSIS — M9981 Other biomechanical lesions of cervical region: Secondary | ICD-10-CM

## 2013-01-16 DIAGNOSIS — M999 Biomechanical lesion, unspecified: Secondary | ICD-10-CM | POA: Insufficient documentation

## 2013-01-16 DIAGNOSIS — M542 Cervicalgia: Secondary | ICD-10-CM

## 2013-01-16 DIAGNOSIS — M546 Pain in thoracic spine: Secondary | ICD-10-CM | POA: Insufficient documentation

## 2013-01-16 NOTE — Progress Notes (Signed)
New patient referred for evaluation by Dr. Alessandra Bevels.   CC: Patient is a 47 year old female coming in with a complaint of upper back pain.  HPI: Patient states that she's had upper back pain for approximately 6 months duration. Patient does not remember any true injury. Patient describes the pain as more of a dull aching sensation he can have sharp pain with certain movements.  Patient also notices it at an end of the long day after standing and doing repetitive movements. Patient states that seems to be more in the thoracic spine no laterality, denies any radiation to upper or lower extremities. Patient denies any sharp pain. Patient states that she also has significant neck pain bilaterally left greater than right, no radiation down the arm, describes it more as a tight muscle sensation that can cause her to get locked up in certain areas. Patient states that she wakes up in the morning sometimes and is unable to move her neck for approximately one hour. She has tried home remedies and it does seem to respond to ibuprofen. Patient is a severity of both her back pain and neck pain at 6/10.  Patient did have x-rays which are not available by me. Patient was told that there was some mild osteoarthritic changes but no gross bony abnormality.  Past medical, surgical, family and social history reviewed. Medications reviewed all in the electronic medical record.   Review of Systems: No headache, visual changes, nausea, vomiting, diarrhea, constipation, dizziness, abdominal pain, skin rash, fevers, chills, night sweats, weight loss, swollen lymph nodes, body aches, joint swelling, muscle aches, chest pain, shortness of breath, mood changes.   Objective:    Blood pressure 112/78, weight 150 lb (68.04 kg).   General: No apparent distress alert and oriented x3 mood and affect normal, dressed appropriately.  HEENT: Pupils equal, extraocular movements intact Respiratory: Patient's speak in full sentences and  does not appear short of breath Cardiovascular: No lower extremity edema, non tender, no erythema Skin: Warm dry intact with no signs of infection or rash on extremities or on axial skeleton. Abdomen: Soft nontender Neuro: Cranial nerves II through XII are intact, neurovascularly intact in all extremities with 2+ DTRs and 2+ pulses. Lymph: No lymphadenopathy of posterior or anterior cervical chain or axillae bilaterally.  Gait normal with good balance and coordination.  MSK: Non tender with full range of motion and good stability and symmetric strength and tone of shoulders, elbows, wrist, hip, knee and ankles bilaterally.  Neck: Inspection unremarkable. No palpable stepoffs. Negative Spurling's maneuver. Full neck range of motion Grip strength and sensation normal in bilateral hands Strength good C4 to T1 distribution No sensory change to C4 to T1 Negative Hoffman sign bilaterally Reflexes normal Back Exam:  Inspection: Unremarkable, does hold shoulders anteriorly secondary to weak upper back musculature Motion: Flexion 45 deg, Extension 45 deg, Side Bending to 45 deg bilaterally,  Rotation to 45 deg bilaterally  SLR laying: Negative  XSLR laying: Negative  Palpable tenderness: None. FABER: negative. Sensory change: Gross sensation intact to all lumbar and sacral dermatomes.  Reflexes: 2+ at both patellar tendons, 2+ at achilles tendons, Babinski's downgoing.  Strength at foot  Plantar-flexion: 5/5 Dorsi-flexion: 5/5 Eversion: 5/5 Inversion: 5/5  Leg strength  Quad: 5/5 Hamstring: 5/5 Hip flexor: 5/5 Hip abductors: 5/5  Gait unremarkable. Poor core strength  OMT Physical Exam   Standing flexion   left shoulder higher  nuetral sacrum.   Seated Flexion Right  Cervical  C2 flexed rotated and side  bent left C5 flexed rotated and side bent  right C7 flexed rotated and side bent  Right  Thoracic T1 extended rotated and side bent left with elevated first rib T5 extended  rotated inside that right  Lumbar L2 flexed rotated inside that right  Sacrum  Illium Right superior ilium      Impression and Recommendations:     This case required medical decision making of moderate complexity.

## 2013-01-16 NOTE — Patient Instructions (Signed)
Very nice to meet you I think you will respond well to manipulation Try exercises daily or most days of the week.  Back exercises with soup can- bent 45 degrees pull back with elbows in, pinching shoulder blades.  3 sets of 12 Standing straight, lifts can to shoulder height and back down both sides 3 sets of 12 Hip flexor stretch, one knee up one down, tilt pelvis forward hold for 10 seconds, relax and repeat do both sides AM and PM. If easy rotate torso toward upper leg.  Posture, heel, butt, shoulders and head against wall for total of 5 minutes.  Ibuprofen 600mg  3 times a day for 3 days.  Consider choline Tumeric 500mg  twice daily.  Come back again in 2 weeks.

## 2013-01-16 NOTE — Assessment & Plan Note (Signed)
Decision today to treat with OMT was based on Physical Exam  After verbal consent patient was treated with HVLa and ME techniques in cervical, thoracic, rib cage lumbar and illium areas  Patient tolerated the procedure well with improvement in symptoms  Patient given exercises, stretches and lifestyle modifications  See medications in patient instructions if given  Patient will follow up in 2 weeks  

## 2013-01-16 NOTE — Assessment & Plan Note (Signed)
Muscular skeletal in nature. Patient also has involvement likely secondary to her job. Home exercise given to help with posture as well as range of motion of the neck. Patient did respond well to manipulation. Discussed over-the-counter medications that can be beneficial. Patient will followup again in 2 weeks for further manipulation if this was helpful.

## 2013-01-16 NOTE — Assessment & Plan Note (Signed)
Patient does have thoracic back pain. I do think this is muscle skeletal in nature and secondary to muscle imbalances. Patient given home exercise program and discussed core strengthening exercises that can be helpful. Patient also had pes planus bilaterally. Patient may need custom orthotics if this continues as well to help with alignment. Patient did respond well to osteopathic manipulation.

## 2013-01-16 NOTE — Assessment & Plan Note (Signed)
Decision today to treat with OMT was based on Physical Exam  After verbal consent patient was treated with HVLa and ME techniques in cervical, thoracic, rib cage lumbar and illium areas  Patient tolerated the procedure well with improvement in symptoms  Patient given exercises, stretches and lifestyle modifications  See medications in patient instructions if given  Patient will follow up in 2 weeks

## 2013-01-21 ENCOUNTER — Other Ambulatory Visit (HOSPITAL_COMMUNITY): Payer: Self-pay | Admitting: Psychiatry

## 2013-01-21 NOTE — Telephone Encounter (Signed)
Will fill wellbutrin xl 90 day supply.

## 2013-01-22 ENCOUNTER — Encounter (HOSPITAL_COMMUNITY): Payer: Self-pay | Admitting: Psychiatry

## 2013-01-22 ENCOUNTER — Ambulatory Visit (INDEPENDENT_AMBULATORY_CARE_PROVIDER_SITE_OTHER): Payer: BC Managed Care – PPO | Admitting: Psychiatry

## 2013-01-22 VITALS — BP 113/79 | HR 86 | Ht 62.25 in | Wt 149.0 lb

## 2013-01-22 DIAGNOSIS — F605 Obsessive-compulsive personality disorder: Secondary | ICD-10-CM

## 2013-01-22 DIAGNOSIS — F331 Major depressive disorder, recurrent, moderate: Secondary | ICD-10-CM

## 2013-01-22 DIAGNOSIS — Z62820 Parent-biological child conflict: Secondary | ICD-10-CM

## 2013-01-22 NOTE — Progress Notes (Signed)
[facility] Behavioral Health Follow-up Outpatient Visit  Tuwana Kapaun August 11, 1965   Date: 01/22/2013  HPI Comments: HISTORY OF CURRENT ILLNESS:  Ms. Rann is a 47 Y/o female with a past psychiatric history significant for Major Depressive Disorder, recurrent moderate. The patient is referred for psychiatric services for medication management.   The patient is concerned about her daughter's health and problems at school.  The patient's marriage still having problems however her husband does not seem interested in marriage counseling.  She endorses, today that he is interested in moving to [place], as he is a Forensic scientist and there are several job opportunities there.  The patient reports she is taking her medication and denies  any side effects  In the area of affective symptoms, patient appears euthymic. Patient denies current suicidal ideation, intent, or plan. Patient denies current homicidal ideation, intent, or plan. Patient denies auditory hallucinations. Patient denies visual hallucinations. Patient denies symptoms of paranoia. Patient states sleep is poor, though she reports approximately 8 hours of sleep per night with a CPAP. Appetite is good. Energy level is low. Patient endorses some symptoms of anhedonia. Patient denies hopelessness, helplessness, or guilt.   Denies any recent episodes consistent with mania, particularly decreased need for sleep with increased energy, grandiosity, impulsivity, hyperverbal and pressured speech, or increased productivity. Denies any recent symptoms consistent with psychosis, particularly auditory or visual hallucinations, thought broadcasting/insertion/withdrawal, or ideas of reference. Also denies excessive worry to the point of physical symptoms as well as any panic attacks. Denies any history of trauma or symptoms consistent with PTSD such as flashbacks, nightmares, hypervigilance, feelings of numbness or inability to connect with others.    Duration: 25 years- and worsening at age 81 Timing: No specific timing. Context:  .spp  Review of Systems  Constitutional: Negative for fever, chills, weight loss and diaphoresis.  Respiratory: Negative for cough, sputum production, shortness of breath and wheezing.   Cardiovascular: Negative for chest pain, palpitations and leg swelling.  Gastrointestinal: Positive for nausea (with ketoconazole) and blood in stool (Secondary to hemorrhoids.). Negative for heartburn, vomiting, abdominal pain, diarrhea and constipation.   Filed Vitals:   01/22/13 1043  BP: 113/79  Pulse: 86  Height: 5' 2.25" (1.581 m)  Weight: 149 lb (67.586 kg)   Physical Exam  Vitals reviewed.  Constitutional: No distress.  Strength & Muscle Tone: normal Gait & Station: normal Patient leans: N/A  History of Loss of Consciousness: No  Seizure History: No  Cardiac History: No   Allergies: Reviewed  Allergies   Allergen  Reactions   .  Relafen (Nabumetone)  Hives, Itching and Swelling   .  Sulfa Antibiotics     PCP: Harl Bowie   Current Medications: Reviewed  Current Outpatient Prescriptions on File Prior to Visit  Medication Sig Dispense Refill  . 5-Hydroxytryptophan (5-HTP) 100 MG CAPS Take 1 capsule by mouth daily.      Marland Kitchen albuterol (PROVENTIL HFA;VENTOLIN HFA) 108 (90 BASE) MCG/ACT inhaler Inhale 2 puffs into the lungs every 6 (six) hours as needed. prn       . buPROPion (WELLBUTRIN XL) 150 MG 24 hr tablet TAKE ONE TABLET BY MOUTH ONCE DAILY  90 tablet  0  . calcium citrate (CALCITRATE - DOSED IN MG ELEMENTAL CALCIUM) 950 MG tablet Take 1 tablet by mouth daily.      . Cholecalciferol (VITAMIN D) 2000 UNITS tablet Take 2,000 Units by mouth.       . fexofenadine (ALLEGRA) 180 MG tablet Take 180  mg by mouth daily.        Marland Kitchen L-Methylfolate (DEPLIN PO) Take 400 mcg by mouth 2 (two) times daily.      . metaxalone (SKELAXIN) 800 MG tablet Take 800 mg by mouth as needed.      . modafinil (PROVIGIL) 200 MG  tablet Take 200 mg by mouth daily.       . mometasone (ASMANEX) 220 MCG/INH inhaler Inhale 1 puff into the lungs daily. Prescribed by Dr. Tempie Donning.      . montelukast (SINGULAIR) 10 MG tablet Take 10 mg by mouth Daily.      . Omega-3 Fatty Acids (FISH OIL) 300 MG CAPS Take 900 mg by mouth.      . SYMBICORT 80-4.5 MCG/ACT inhaler Inhale 2 puffs into the lungs 2 (two) times daily.      Marland Kitchen thyroid (ARMOUR) 30 MG tablet Take 90 mg by mouth every morning.       . topiramate (TOPAMAX) 50 MG tablet Take 75 mg by mouth at bedtime. 50 mg QAM, 100 QPM      . vitamin A 16109 UNIT capsule Take 50,000 Units by mouth daily.      Marland Kitchen zolmitriptan (ZOMIG) 5 MG tablet Take 5 mg by mouth as needed. prn        No current facility-administered medications on file prior to visit.   buPROPion (WELLBUTRIN XL) 150 MG 24 hr tablet Take 1 tablet (150 mg total) by mouth daily.  90 tablet  0    Previous Psychotropic Medications: Reviewed  Patient has had a trial of the following psychotropic medications:  Wellbutrin 150 mg  Celexa 20-took for a couple years-sexual side  Effexor- couldn't stay away  Prozac-took for a little while  Patient is currently on the following psychotropic medications:  Wellbutrin   SUBSTANCE USE HISTORY:  Caffeine: Rare use  Nicotine: Patient denies.  Alcohol: Patient denies.  Illicit Drugs: Patient denies.   Blackouts: No  DT's: No  Withdrawal Symptoms: No None   FAMILY PSYCHIATRIC AND MEDICAL HISTORY: Reviewed  Family History  Problem Relation Age of Onset  . Alcohol abuse Father   . Alcohol abuse Brother   . Coronary artery disease Maternal Grandmother   . Transient ischemic attack Maternal Grandmother   . Coronary artery disease Maternal Grandfather   . Heart attack Maternal Grandfather   . Transient ischemic attack Paternal Grandfather     Psychiatric Specialty Exam:   Objective: Appearance: Casual and Well Groomed   Eye Contact:: Good   Speech: Clear and Coherent and  Normal Rate   Volume: Normal   Mood: "A little more volatile  Depression: 4/10 (0=Very depressed; 5=Neutral; 10=Very Happy)  Anxiety- 0-1/10 (0=no anxiety; 5= moderate/tolerable anxiety; 10= panic attacks)   Affect: Appropriate and Full Range   Thought Process: Coherent, Intact and Loose   Orientation: Full   Thought Content: WDL   Suicidal Thoughts: No   Homicidal Thoughts: No   Judgement: Intact   Insight: Fair   Psychomotor Activity: Normal   Akathisia: No   Handed: Right   Memory: 3/3 Immediate; 3/3 recent.   Assets: Communication Skills  Desire for Improvement  Financial Resources/Insurance  Housing  Intimacy  Vocational/Educational    Assessment:  AXIS I  Major Depressive Disorder, recurrent, moderate , Parent Child relational problem   AXIS II  No diagnosis   AXIS III  .  Subclinical hypothyroidism     .  Migraines     .  Asthma with allergic  rhinitis     .  Sleep apnea syndrome     .  Elevated liver enzymes     .  Liver disease       Fatty liver   AXIS IV  problems with primary support group   AXIS V  GAF: 60 moderate symptoms    PLAN:  1. Affirm with the patient that the medications are taken as ordered. Patient expressed understanding of how their medications were to be used.  2. Continue the following psychiatric medications as written prior to this appointment:  a) Continue Wellbutrin 150 mg. Patient does not want to increase wellbutrin b) I have asked the patient to report any worsening of depressive symptoms, at which point we will increase bupropion.  C) The patient has been on an SSRI before (celexa, zoloft, effexor) had some side effects.  3. Therapy: brief supportive therapy provided. Discussed her psychosocial stressors. Continue individual therapy.  Again encouraged restarting couples counseling. 4. Risks and benefits, side effects and alternatives discussed with patient, she was given an opportunity to ask questions about her medication, illness, and  treatment. All current psychiatric medications have been reviewed and discussed with the patient and adjusted as clinically appropriate. The patient has been provided an accurate and updated list of the medications being now prescribed.  5. Patient told to call clinic if any problems occur. Patient advised to go to ER if she should develop SI/HI, side effects, or if symptoms worsen. Has crisis numbers to call if needed.  6. No labs warranted at this time. 7. The patient was encouraged to keep all PCP and specialty clinic appointments.  8. Patient was instructed to return to clinic in 3 months.  As patient wishes to continue follow up with PCP will do so if no worsening of depression or need for medication change in 6 months.  9. The patient expressed understanding of the plan outlined above and agrees with the plan.

## 2013-01-29 ENCOUNTER — Ambulatory Visit (INDEPENDENT_AMBULATORY_CARE_PROVIDER_SITE_OTHER): Payer: BC Managed Care – PPO | Admitting: Family Medicine

## 2013-01-29 ENCOUNTER — Encounter: Payer: Self-pay | Admitting: Family Medicine

## 2013-01-29 VITALS — BP 116/80 | HR 79 | Wt 146.0 lb

## 2013-01-29 DIAGNOSIS — M999 Biomechanical lesion, unspecified: Secondary | ICD-10-CM

## 2013-01-29 DIAGNOSIS — M542 Cervicalgia: Secondary | ICD-10-CM

## 2013-01-29 DIAGNOSIS — M9981 Other biomechanical lesions of cervical region: Secondary | ICD-10-CM

## 2013-01-29 NOTE — Assessment & Plan Note (Signed)
Decision today to treat with OMT was based on Physical Exam  After verbal consent patient was treated with HVLa and ME techniques in cervical, thoracic,  lumbar and sacrum areas  Patient tolerated the procedure well with improvement in symptoms  Patient given exercises, stretches and lifestyle modifications  See medications in patient instructions if given  Patient will follow up in 4 weeks        

## 2013-01-29 NOTE — Progress Notes (Signed)
Pre-visit discussion using our clinic review tool. No additional management support is needed unless otherwise documented below in the visit note.  

## 2013-01-29 NOTE — Progress Notes (Signed)
  CC: Patient is a 47 year old female followup for neck pain  HPI: Patient was seen previously for neck pain. Patient did have osteopathic manipulation states that she had minimal improvement. Patient has been doing the exercises fairly regularly but is having more of quadricep burning than she would anticipate. This has stopped her from doing the exercises on a regular basis. Patient denies any new symptoms. Patient is still having unfortunately the same symptoms. Please see previous note. Patient is here for further manipulation therapy.    Past medical, surgical, family and social history reviewed. Medications reviewed all in the electronic medical record.   Review of Systems: No headache, visual changes, nausea, vomiting, diarrhea, constipation, dizziness, abdominal pain, skin rash, fevers, chills, night sweats, weight loss, swollen lymph nodes, body aches, joint swelling, muscle aches, chest pain, shortness of breath, mood changes.   Objective:    Blood pressure 116/80, pulse 79, weight 146 lb (66.225 kg), last menstrual period 12/30/2012, SpO2 97.00%.   General: No apparent distress alert and oriented x3 mood and affect normal, dressed appropriately.  HEENT: Pupils equal, extraocular movements intact Respiratory: Patient's speak in full sentences and does not appear short of breath Cardiovascular: No lower extremity edema, non tender, no erythema Skin: Warm dry intact with no signs of infection or rash on extremities or on axial skeleton. Abdomen: Soft nontender Neuro: Cranial nerves II through XII are intact, neurovascularly intact in all extremities with 2+ DTRs and 2+ pulses. Lymph: No lymphadenopathy of posterior or anterior cervical chain or axillae bilaterally.  Gait normal with good balance and coordination.  MSK: Non tender with full range of motion and good stability and symmetric strength and tone of shoulders, elbows, wrist, hip, knee and ankles bilaterally.   Neck: Inspection unremarkable. No palpable stepoffs. Negative Spurling's maneuver. Full neck range of motion Grip strength and sensation normal in bilateral hands Strength good C4 to T1 distribution No sensory change to C4 to T1 Negative Hoffman sign bilaterally Reflexes normal Back Exam:  Inspection: Unremarkable, does hold shoulders anteriorly secondary to weak upper back musculature Motion: Flexion 45 deg, Extension 45 deg, Side Bending to 45 deg bilaterally,  Rotation to 45 deg bilaterally  SLR laying: Negative  XSLR laying: Negative  Palpable tenderness: None. FABER: negative. Sensory change: Gross sensation intact to all lumbar and sacral dermatomes.  Reflexes: 2+ at both patellar tendons, 2+ at achilles tendons, Babinski's downgoing.  Strength at foot  Plantar-flexion: 5/5 Dorsi-flexion: 5/5 Eversion: 5/5 Inversion: 5/5  Leg strength  Quad: 5/5 Hamstring: 5/5 Hip flexor: 5/5 Hip abductors: 5/5  Gait unremarkable. Poor core strength  OMT Physical Exam   Cervical  C2 flexed rotated and side bent left C5 flexed rotated and side bent  right C7 flexed rotated and side bent  Right  Thoracic T3 extended rotated and side bent left T5 extended rotated and side bent right  Lumbar L2 flexed rotated inside that right  Sacrum Left on left Illium Neutral     Impression and Recommendations:     This case required medical decision making of moderate complexity.

## 2013-01-29 NOTE — Patient Instructions (Signed)
Good to see you Work on posture specifically. On wall exercise.  Also work on balance.  Use chair, going down on one leg and come back up 3 sets of 1- dead on and on side daily.  Do both legs Com e back again in 4 weeks.  If knee is still bothering you then we will do xray.

## 2013-01-29 NOTE — Assessment & Plan Note (Signed)
Patient given further exercises really working' as well as balance. We discussed potential formal physical therapy which patient declined. Patient will follow up again in 4 weeks for further evaluation.

## 2013-02-19 ENCOUNTER — Ambulatory Visit (INDEPENDENT_AMBULATORY_CARE_PROVIDER_SITE_OTHER): Payer: BC Managed Care – PPO | Admitting: Family Medicine

## 2013-02-19 ENCOUNTER — Encounter: Payer: Self-pay | Admitting: Family Medicine

## 2013-02-19 VITALS — BP 132/78 | HR 81

## 2013-02-19 DIAGNOSIS — M546 Pain in thoracic spine: Secondary | ICD-10-CM

## 2013-02-19 DIAGNOSIS — M999 Biomechanical lesion, unspecified: Secondary | ICD-10-CM

## 2013-02-19 DIAGNOSIS — M9981 Other biomechanical lesions of cervical region: Secondary | ICD-10-CM

## 2013-02-19 NOTE — Patient Instructions (Signed)
Good to see you Spenco orthotics at [employer]. They are about $30 Look for grren soles with black bottom.  Do the 1 legged wall sits at 45 degrees instead of balance Continue posture exercises.  Lets try 4 weeks.

## 2013-02-19 NOTE — Assessment & Plan Note (Signed)
Patient continues to need to work on her posture. Encourage her to continue to do more lifting of the upper back to try to strengthen taking the pressure off of her neck as well. Patient also will change him the exercises to more wall sits to avoid too much bouncing if this is giving her ankle some trouble. Told her to potentially back off on some of the exercises while she is on the doxycycline. Patient will come back again in 3-4 weeks for further evaluation.

## 2013-02-19 NOTE — Assessment & Plan Note (Signed)
Decision today to treat with OMT was based on Physical Exam  After verbal consent patient was treated with HVLa and ME techniques in cervical, thoracic,  lumbar and sacrum areas  Patient tolerated the procedure well with improvement in symptoms  Patient given exercises, stretches and lifestyle modifications  See medications in patient instructions if given  Patient will follow up in 4 weeks        

## 2013-02-19 NOTE — Progress Notes (Signed)
  CC: Patient is a 47 year old female followup for neck pain  HPI: Patient was seen previously for neck pain. Patient did have osteopathic manipulation states she continues to have some moderate improvement. Patient has been tried to do the home exercises especially the posterolateral is that does give her some discomfort. Patient also states that any of the balancing exercises seem to make it somewhat worse and is causing her some mild left ankle and left knee pain. Patient is also states she started doxycycline for 3 week to see if we can improve but unfortunately she has started having more pain. This medication was prescribed by her primary care physician. Patient denies any new symptoms.   Past medical, surgical, family and social history reviewed. Medications reviewed all in the electronic medical record.   Review of Systems: No headache, visual changes, nausea, vomiting, diarrhea, constipation, dizziness, abdominal pain, skin rash, fevers, chills, night sweats, weight loss, swollen lymph nodes, body aches, joint swelling, muscle aches, chest pain, shortness of breath, mood changes.   Objective:    Blood pressure 132/78, pulse 81, last menstrual period 12/30/2012, SpO2 99.00%.   General: No apparent distress alert and oriented x3 mood and affect normal, dressed appropriately.  HEENT: Pupils equal, extraocular movements intact Respiratory: Patient's speak in full sentences and does not appear short of breath Cardiovascular: No lower extremity edema, non tender, no erythema Skin: Warm dry intact with no signs of infection or rash on extremities or on axial skeleton. Abdomen: Soft nontender Neuro: Cranial nerves II through XII are intact, neurovascularly intact in all extremities with 2+ DTRs and 2+ pulses. Lymph: No lymphadenopathy of posterior or anterior cervical chain or axillae bilaterally.  Gait normal with good balance and coordination.  MSK: Non tender with full range of motion and  good stability and symmetric strength and tone of shoulders, elbows, wrist, hip, knee and ankles bilaterally.  Neck: Inspection unremarkable. No palpable stepoffs. Negative Spurling's maneuver. Full neck range of motion Grip strength and sensation normal in bilateral hands Strength good C4 to T1 distribution No sensory change to C4 to T1 Negative Hoffman sign bilaterally Reflexes normal Back Exam:  Inspection: Unremarkable, does hold shoulders anteriorly secondary to weak upper back musculature Motion: Flexion 45 deg, Extension 45 deg, Side Bending to 45 deg bilaterally,  Rotation to 45 deg bilaterally  SLR laying: Negative  XSLR laying: Negative  Palpable tenderness: None. FABER: negative. Sensory change: Gross sensation intact to all lumbar and sacral dermatomes.  Reflexes: 2+ at both patellar tendons, 2+ at achilles tendons, Babinski's downgoing.  Strength at foot  Plantar-flexion: 5/5 Dorsi-flexion: 5/5 Eversion: 5/5 Inversion: 5/5  Leg strength  Quad: 5/5 Hamstring: 5/5 Hip flexor: 5/5 Hip abductors: 5/5  Gait unremarkable. Poor core strength  OMT Physical Exam   Cervical  C2 flexed rotated and side bent left C5 flexed rotated and side bent  right   Thoracic T3 extended rotated and side bent left T5 extended rotated and side bent right  Lumbar L2 flexed rotated inside that right  Sacrum Left on left Illium Neutral     Impression and Recommendations:     This case required medical decision making of moderate complexity.

## 2013-03-21 ENCOUNTER — Encounter: Payer: Self-pay | Admitting: Family Medicine

## 2013-03-21 ENCOUNTER — Ambulatory Visit (INDEPENDENT_AMBULATORY_CARE_PROVIDER_SITE_OTHER): Payer: BC Managed Care – PPO | Admitting: Family Medicine

## 2013-03-21 VITALS — BP 114/60 | HR 83 | Temp 98.3°F | Resp 16 | Wt 150.0 lb

## 2013-03-21 DIAGNOSIS — M999 Biomechanical lesion, unspecified: Secondary | ICD-10-CM

## 2013-03-21 DIAGNOSIS — M546 Pain in thoracic spine: Secondary | ICD-10-CM

## 2013-03-21 DIAGNOSIS — M9981 Other biomechanical lesions of cervical region: Secondary | ICD-10-CM

## 2013-03-21 NOTE — Assessment & Plan Note (Signed)
Patient is joining a gym and encourage her to do more exercises for the upper back. Patient was given a handout and we did discuss the importance of eccentric exercises.  Patient will continue the home remedies as well as medications that she's had been on previously. We did discuss potentially anxiety could be contributing and she may consider [employer]. John's wort. Patient and will come back again in 4 weeks for further manipulation therapy.

## 2013-03-21 NOTE — Assessment & Plan Note (Signed)
Decision today to treat with OMT was based on Physical Exam  After verbal consent patient was treated with HVLa and ME techniques in cervical, thoracic,  lumbar and sacrum areas  Patient tolerated the procedure well with improvement in symptoms  Patient given exercises, stretches and lifestyle modifications  See medications in patient instructions if given  Patient will follow up in 4 weeks        

## 2013-03-21 NOTE — Patient Instructions (Signed)
Good to see you When going to gym, resistance training 2-3 times a week.  Elliptical or bike for cardio Focus on legs, core and upper back.  Come back in 4 weeks.

## 2013-03-21 NOTE — Progress Notes (Signed)
  CC: Patient is a 48 year old female followup for neck pain  HPI: Patient was seen previously for neck pain. Patient did have osteopathic manipulation states she continues to have some moderate improvement. Patient to states that she is having much more and muscle spasms but she thinks this is secondary to a recent loss in her family. Patient did have to go out of town for a funeral. Patient was in a car significant amount of time that has also given her some mild increasing low back pain. Patient had not been doing exercises on a regular basis because of this as well as the holidays. Patient denies any significant new symptoms just seems to be exacerbations of previous symptoms.  Past medical, surgical, family and social history reviewed. Medications reviewed all in the electronic medical record.   Review of Systems: No headache, visual changes, nausea, vomiting, diarrhea, constipation, dizziness, abdominal pain, skin rash, fevers, chills, night sweats, weight loss, swollen lymph nodes, body aches, joint swelling, muscle aches, chest pain, shortness of breath, mood changes.   Objective:    Blood pressure 114/60, pulse 83, temperature 98.3 F (36.8 C), temperature source Oral, resp. rate 16, weight 150 lb (68.04 kg), SpO2 99.00%.   General: No apparent distress alert and oriented x3 mood and affect normal, dressed appropriately.  HEENT: Pupils equal, extraocular movements intact Respiratory: Patient's speak in full sentences and does not appear short of breath Cardiovascular: No lower extremity edema, non tender, no erythema Skin: Warm dry intact with no signs of infection or rash on extremities or on axial skeleton. Abdomen: Soft nontender Neuro: Cranial nerves II through XII are intact, neurovascularly intact in all extremities with 2+ DTRs and 2+ pulses. Lymph: No lymphadenopathy of posterior or anterior cervical chain or axillae bilaterally.  Gait normal with good balance and coordination.   MSK: Non tender with full range of motion and good stability and symmetric strength and tone of shoulders, elbows, wrist, hip, knee and ankles bilaterally.  Neck: Inspection unremarkable. No palpable stepoffs. Negative Spurling's maneuver. Full neck range of motion Grip strength and sensation normal in bilateral hands Strength good C4 to T1 distribution No sensory change to C4 to T1 Negative Hoffman sign bilaterally Reflexes normal   OMT Physical Exam   Cervical  C2 flexed rotated and side bent left C5 flexed rotated and side bent  Right C7 flexed rotated and side bent right   Thoracic T3 extended rotated and side bent left T5 extended rotated and side bent right  Lumbar L2 flexed rotated inside that right  Sacrum Left on left Illium Neutral     Impression and Recommendations:     This case required medical decision making of moderate complexity.

## 2013-04-23 ENCOUNTER — Ambulatory Visit: Payer: BC Managed Care – PPO | Admitting: Family Medicine

## 2013-04-24 ENCOUNTER — Ambulatory Visit (HOSPITAL_COMMUNITY): Payer: Self-pay | Admitting: Psychiatry

## 2013-05-02 ENCOUNTER — Ambulatory Visit: Payer: BC Managed Care – PPO | Admitting: Family Medicine

## 2013-05-03 ENCOUNTER — Ambulatory Visit (HOSPITAL_COMMUNITY): Payer: BC Managed Care – PPO | Admitting: Psychiatry

## 2013-05-03 ENCOUNTER — Encounter (INDEPENDENT_AMBULATORY_CARE_PROVIDER_SITE_OTHER): Payer: Self-pay

## 2013-05-04 ENCOUNTER — Other Ambulatory Visit (HOSPITAL_COMMUNITY): Payer: Self-pay | Admitting: Psychiatry

## 2013-05-06 NOTE — Telephone Encounter (Signed)
Refill request for wellbutrin. Refilled wellbutrin for 90 days.

## 2013-05-07 ENCOUNTER — Encounter: Payer: Self-pay | Admitting: Family Medicine

## 2013-05-07 ENCOUNTER — Ambulatory Visit (INDEPENDENT_AMBULATORY_CARE_PROVIDER_SITE_OTHER): Payer: BC Managed Care – PPO | Admitting: Family Medicine

## 2013-05-07 VITALS — BP 124/76 | HR 70 | Temp 97.2°F | Resp 16 | Wt 157.0 lb

## 2013-05-07 DIAGNOSIS — M542 Cervicalgia: Secondary | ICD-10-CM

## 2013-05-07 DIAGNOSIS — M999 Biomechanical lesion, unspecified: Secondary | ICD-10-CM

## 2013-05-07 DIAGNOSIS — M9981 Other biomechanical lesions of cervical region: Secondary | ICD-10-CM

## 2013-05-07 NOTE — Assessment & Plan Note (Signed)
Decision today to treat with OMT was based on Physical Exam  After verbal consent patient was treated with HVLa and ME techniques in cervical, thoracic,  lumbar and sacrum areas  Patient tolerated the procedure well with improvement in symptoms  Patient given exercises, stretches and lifestyle modifications  See medications in patient instructions if given  Patient will follow up in 4 weeks        

## 2013-05-07 NOTE — Progress Notes (Signed)
  CC: Patient is a 48 year old female followup for neck pain  HPI: Patient was seen previously for neck pain. Patient did have osteopathic manipulation states she continues to have some moderate improvement. Patient is now working with a trainer on a regular basis. Patient is brought in the workout regimen for me to review. Patient states any overhead activity does seem to give her neck more discomfort. Patient has made changes with her working environment which has been beneficial. Overall patient think she continues to improve. Taking over-the-counter medicines with no side effects.  Past medical, surgical, family and social history reviewed. Medications reviewed all in the electronic medical record.   Review of Systems: No headache, visual changes, nausea, vomiting, diarrhea, constipation, dizziness, abdominal pain, skin rash, fevers, chills, night sweats, weight loss, swollen lymph nodes, body aches, joint swelling, muscle aches, chest pain, shortness of breath, mood changes.   Objective:    Blood pressure 124/76, pulse 70, temperature 97.2 F (36.2 C), temperature source Oral, resp. rate 16, weight 157 lb (71.215 kg), SpO2 96.00%.   General: No apparent distress alert and oriented x3 mood and affect normal, dressed appropriately.  HEENT: Pupils equal, extraocular movements intact Respiratory: Patient's speak in full sentences and does not appear short of breath Cardiovascular: No lower extremity edema, non tender, no erythema Skin: Warm dry intact with no signs of infection or rash on extremities or on axial skeleton. Abdomen: Soft nontender Neuro: Cranial nerves II through XII are intact, neurovascularly intact in all extremities with 2+ DTRs and 2+ pulses. Lymph: No lymphadenopathy of posterior or anterior cervical chain or axillae bilaterally.  Gait normal with good balance and coordination.  MSK: Non tender with full range of motion and good stability and symmetric strength and tone of  shoulders, elbows, wrist, hip, knee and ankles bilaterally.  Neck: Inspection unremarkable. No palpable stepoffs. Negative Spurling's maneuver. Full neck range of motion Grip strength and sensation normal in bilateral hands Strength good C4 to T1 distribution No sensory change to C4 to T1 Negative Hoffman sign bilaterally Reflexes normal   OMT Physical Exam   Cervical  C2 flexed rotated and side bent left C7 flexed rotated and side bent right   Thoracic T3 extended rotated and side bent left T5 extended rotated and side bent right  Lumbar L2 flexed rotated inside that right  Sacrum Left on left Illium Neutral     Impression and Recommendations:     This case required medical decision making of moderate complexity.

## 2013-05-07 NOTE — Progress Notes (Signed)
Pre visit review using our clinic review tool, if applicable. No additional management support is needed unless otherwise documented below in the visit note. 

## 2013-05-07 NOTE — Patient Instructions (Signed)
Good to see you No exercises where you cannot see your hands Stop shoulder press or do anteriorly.  For rear flys do on incline instead of horizontal.  Quadratus lumborum stretch do at least daily. One knee down, one up rotate torso around front leg and hold.  Back extensions do pulses and decrease range of motion for now.  Follow up 4 weeks.

## 2013-05-07 NOTE — Assessment & Plan Note (Signed)
Patient was given more postural exercises as well as augmented the information from her trainer to be more safe. We are going to avoid overhead activity. Patient was shown proper lifting mechanics. We also changed other exercises to a more neutral position. Spent greater than 25 minutes with patient face-to-face and had greater than 50% of counseling including as described above in assessment and plan. Patient did respond well to osteopathic manipulation and will come back again in 4-6 weeks for further evaluation. Hopefully after that we'll be able to increase the duration between visits.

## 2013-06-04 ENCOUNTER — Ambulatory Visit (INDEPENDENT_AMBULATORY_CARE_PROVIDER_SITE_OTHER): Payer: BC Managed Care – PPO | Admitting: Family Medicine

## 2013-06-04 ENCOUNTER — Encounter: Payer: Self-pay | Admitting: Family Medicine

## 2013-06-04 VITALS — BP 124/82 | HR 83

## 2013-06-04 DIAGNOSIS — M542 Cervicalgia: Secondary | ICD-10-CM

## 2013-06-04 DIAGNOSIS — M9981 Other biomechanical lesions of cervical region: Secondary | ICD-10-CM

## 2013-06-04 DIAGNOSIS — M999 Biomechanical lesion, unspecified: Secondary | ICD-10-CM

## 2013-06-04 NOTE — Patient Instructions (Signed)
Good to see you Try the exercises for the shoulder I think it will get better Sorry for the migraine I hope this helps.  When moving furniture or yard work try to always use both arms.  Come back in 4-6 weeks.

## 2013-06-04 NOTE — Assessment & Plan Note (Signed)
Decision today to treat with OMT was based on Physical Exam  After verbal consent patient was treated with HVLa and ME techniques in cervical, thoracic,  lumbar and sacrum areas  Patient tolerated the procedure well with improvement in symptoms  Patient given exercises, stretches and lifestyle modifications  See medications in patient instructions if given  Patient will follow up in 4 weeks        

## 2013-06-04 NOTE — Progress Notes (Signed)
  CC: Patient is a 48 year old female followup for neck pain  HPI: Patient was seen previously for neck pain. Patient did have osteopathic manipulation states she continues to have some moderate improvement. Patient has noticed little bit more pain radiating to her left shoulder which is new. Patient was moving furniture as well as doing a significant amount yard work. Overall though she still able to do daily activities without any significant trouble. Patient knows she was not using proper lifting mechanics. Patient denies any radiation to her arm or any numbness. Otherwise patient still has the same aches and pains but is doing the exercises which has been helpful.  Past medical, surgical, family and social history reviewed. Medications reviewed all in the electronic medical record.   Review of Systems: No headache, visual changes, nausea, vomiting, diarrhea, constipation, dizziness, abdominal pain, skin rash, fevers, chills, night sweats, weight loss, swollen lymph nodes, body aches, joint swelling, muscle aches, chest pain, shortness of breath, mood changes.   Objective:    Blood pressure 124/82, pulse 83, SpO2 98.00%.   General: No apparent distress alert and oriented x3 mood and affect normal, dressed appropriately.  HEENT: Pupils equal, extraocular movements intact Respiratory: Patient's speak in full sentences and does not appear short of breath Cardiovascular: No lower extremity edema, non tender, no erythema Skin: Warm dry intact with no signs of infection or rash on extremities or on axial skeleton. Abdomen: Soft nontender Neuro: Cranial nerves II through XII are intact, neurovascularly intact in all extremities with 2+ DTRs and 2+ pulses. Lymph: No lymphadenopathy of posterior or anterior cervical chain or axillae bilaterally.  Gait normal with good balance and coordination.  MSK: Non tender with full range of motion and good stability and symmetric strength and tone of shoulders,  elbows, wrist, hip, knee and ankles bilaterally.  Neck: Inspection unremarkable. No palpable stepoffs. Negative Spurling's maneuver. Full neck range of motion Grip strength and sensation normal in bilateral hands Strength good C4 to T1 distribution No sensory change to C4 to T1 Negative Hoffman sign bilaterally Reflexes normal   OMT Physical Exam   Cervical  C2 flexed rotated and side bent left  Thoracic T3 extended rotated and side bent left T5 extended rotated and side bent right  Lumbar L1 flexed rotated inside that right  Sacrum Left on left  Illium Neutral     Impression and Recommendations:     This case required medical decision making of moderate complexity.

## 2013-06-04 NOTE — Assessment & Plan Note (Signed)
She continues to have some mechanical problems. Patient shoulder pain was more of a spasm of the trapezius the neck muscle in nature. Patient did respond well to osteopathic regulation again in May encourage more postural control and core strengthening that could be beneficial as well. Patient will come back again in 4-6 weeks for further evaluation and treatment.

## 2013-06-13 ENCOUNTER — Encounter (HOSPITAL_COMMUNITY): Payer: Self-pay | Admitting: Psychiatry

## 2013-06-13 ENCOUNTER — Ambulatory Visit (INDEPENDENT_AMBULATORY_CARE_PROVIDER_SITE_OTHER): Payer: BC Managed Care – PPO | Admitting: Psychiatry

## 2013-06-13 ENCOUNTER — Telehealth (HOSPITAL_COMMUNITY): Payer: Self-pay | Admitting: Psychiatry

## 2013-06-13 VITALS — BP 107/76 | HR 74 | Wt 157.0 lb

## 2013-06-13 DIAGNOSIS — Z62820 Parent-biological child conflict: Secondary | ICD-10-CM

## 2013-06-13 DIAGNOSIS — Z7189 Other specified counseling: Secondary | ICD-10-CM

## 2013-06-13 DIAGNOSIS — F331 Major depressive disorder, recurrent, moderate: Secondary | ICD-10-CM

## 2013-06-13 MED ORDER — BUPROPION HCL ER (XL) 150 MG PO TB24: 150.0000 mg | ORAL_TABLET | Freq: Every day | ORAL | Status: AC

## 2013-06-13 NOTE — Progress Notes (Signed)
[facility]  Julia FloodJennifer [name] [birth year]   Date: 06/13/2013  HPI Comments: HISTORY OF CURRENT ILLNESS:   HPI Comments:  Julia [name] is a 48 Y/o female with a past psychiatric history significant for Major Depressive Disorder, recurrent moderate. The patient is referred for psychiatric services for medication management.   . Location: The patient reports no worsening or improvement in mood.    . Quality: The patient is concerned about her daughter's health and problems at school.  The patient's marriage still having problems however her husband does not seem interested in marriage counseling.  She endorses, today that [place] is [place] in [place] to [place], as he is a Forensic scientistchemical engineer and there are several job opportunities there.She is working with a physician who specializes in integrative medicine.  The patient reports she is taking her medication and denies  any side effects  In the area of affective symptoms, patient appears euthymic. Patient denies current suicidal ideation, intent, or plan. Patient denies current homicidal ideation, intent, or plan. Patient denies auditory hallucinations. Patient denies visual hallucinations. Patient denies symptoms of paranoia. Patient states sleep is poor, though she reports approximately 8 hours of sleep per night with a CPAP. Appetite is good. Energy level is low. Patient endorses some symptoms of anhedonia. Patient denies hopelessness, helplessness, or guilt.    . Severity: Depression: 5/10 (0=Very depressed; 5=Neutral; 10=Very Happy)  Anxiety- 1/10 (0=no anxiety; 5= moderate/tolerable anxiety; 10= panic attacks)  . Duration: 25 years- and worsening at age 48  . Timing-Mood worsening.  . Context: Marital Stressors. Stressors with her daughter.  . Associated signs and symptoms : Denies any  recent episodes consistent with mania, particularly decreased need for sleep with increased energy,  grandiosity, impulsivity, hyperverbal and pressured speech, or increased productivity. Denies any  recent symptoms consistent with psychosis, particularly auditory or visual hallucinations, thought broadcasting/insertion/withdrawal, or ideas of reference. Also denies excessive worry to the point of physical symptoms as well as any panic attacks. Denies any history of trauma or symptoms consistent with PTSD such as flashbacks, nightmares, hypervigilance, feelings of numbness or inability to connect with others.     Review of Systems  Constitutional: Negative for fever, chills, weight loss and diaphoresis.  Respiratory: Negative for cough, sputum production, shortness of breath and wheezing.   Cardiovascular: Negative for chest pain, palpitations and leg swelling.  Gastrointestinal: Positive for nausea (with ketoconazole) and blood in stool (Secondary to hemorrhoids.). Negative for heartburn, vomiting, abdominal pain, diarrhea and constipation.   Filed Vitals:   06/13/13 1103  BP: 107/76  Pulse: 74  Weight: 157 lb (71.215 kg)    Physical Exam  Vitals reviewed.  Constitutional: No distress.  Strength & Muscle Tone: normal Gait & Station: normal Patient leans: N/A  History of Loss of Consciousness: No  Seizure History: No  Cardiac History: No   Allergies: Reviewed  Allergies   Allergen  Reactions   .  Relafen (Nabumetone)  Hives, Itching and Swelling   .  Sulfa Antibiotics     PCP: Harl Bowieathy Judge   Current Medications: Reviewed  Current Outpatient Prescriptions on File Prior to Visit  Medication Sig Dispense Refill  . 5-Hydroxytryptophan (5-HTP) 100 MG CAPS Take 1 capsule by mouth daily.      Marland Kitchen. albuterol (PROVENTIL HFA;VENTOLIN HFA) 108 (90 BASE) MCG/ACT inhaler Inhale 2 puffs into the lungs every 6 (six) hours as needed. prn       . buPROPion (WELLBUTRIN XL) 150 MG 24 hr  tablet TAKE ONE TABLET BY MOUTH ONCE DAILY  90 tablet  0  . Cholecalciferol (VITAMIN D) 2000 UNITS tablet Take  2,000 Units by mouth.       . doxycycline (VIBRA-TABS) 100 MG tablet Take 100 mg by mouth 2 (two) times daily.      . fexofenadine (ALLEGRA) 180 MG tablet Take 180 mg by mouth daily.        Marland Kitchen L-Methylfolate (DEPLIN PO) Take 400 mcg by mouth 2 (two) times daily.      Marland Kitchen liothyronine (CYTOMEL) 5 MCG tablet Take 1 tablet by mouth every morning.      . metaxalone (SKELAXIN) 800 MG tablet Take 800 mg by mouth as needed.      . modafinil (PROVIGIL) 200 MG tablet Take 200 mg by mouth daily.       . mometasone (ASMANEX) 220 MCG/INH inhaler Inhale 1 puff into the lungs daily. Prescribed by Dr. Tempie Donning.      . montelukast (SINGULAIR) 10 MG tablet Take 10 mg by mouth Daily.      . Omega-3 Fatty Acids (FISH OIL) 300 MG CAPS Take 900 mg by mouth.      . SYMBICORT 80-4.5 MCG/ACT inhaler Inhale 2 puffs into the lungs 2 (two) times daily.      Marland Kitchen thyroid (ARMOUR) 30 MG tablet Take 90 mg by mouth every morning.       . topiramate (TOPAMAX) 50 MG tablet Take 25 mg by mouth 2 (two) times daily.       . vitamin A 16109 UNIT capsule Take 50,000 Units by mouth daily.      Marland Kitchen zolmitriptan (ZOMIG) 5 MG tablet Take 5 mg by mouth as needed. prn        No current facility-administered medications on file prior to visit.   buPROPion (WELLBUTRIN XL) 150 MG 24 hr tablet Take 1 tablet (150 mg total) by mouth daily.  90 tablet  0    Previous Psychotropic Medications: Reviewed  Patient has had a trial of the following psychotropic medications:  Wellbutrin 150 mg  Celexa 20-took for a couple years-sexual side  Effexor- couldn't stay away  Prozac-took for a little while  Patient is currently on the following psychotropic medications:  Wellbutrin   SUBSTANCE USE HISTORY:  Caffeine: Rare use  Nicotine: Patient denies.  Alcohol: Patient denies.  Illicit Drugs: Patient denies.   Blackouts: No  DT's: No  Withdrawal Symptoms: No None   FAMILY PSYCHIATRIC AND MEDICAL HISTORY: Reviewed  Family History  Problem Relation  Age of Onset  . Alcohol abuse Father   . Alcohol abuse Brother   . Coronary artery disease Maternal Grandmother   . Transient ischemic attack Maternal Grandmother   . Coronary artery disease Maternal Grandfather   . Heart attack Maternal Grandfather   . Transient ischemic attack Paternal Grandfather     Psychiatric Specialty Exam:   Objective: Appearance: Casual and Well Groomed   Eye Contact:: Good   Speech: Clear and Coherent and Normal Rate   Volume: Normal   Mood: "Comes and goes"  Affect: Appropriate and Full Range   Thought Process: Coherent, Intact and Loose   Orientation: Full   Thought Content: WDL   Suicidal Thoughts: No   Homicidal Thoughts: No   Judgement: Intact   Insight: Fair   Psychomotor Activity: Normal   Akathisia: No   Language-Intact  Fund of knowledge-Above average  Handed: Right   Memory: 3/3 Immediate; 3/3 recent.   Assets: Manufacturing systems engineer  Desire for Improvement  Financial Resources/Insurance  Housing  Intimacy  Vocational/Educational    Assessment:  AXIS I  Major Depressive Disorder, recurrent, moderate-stable ,  Parent Child relational problem-stable  AXIS II  No diagnosis   AXIS III  .  Subclinical hypothyroidism     .  Migraines     .  Asthma with allergic rhinitis     .  Sleep apnea syndrome     .  Elevated liver enzymes     .  Liver disease       Fatty liver   AXIS IV  problems with primary support group   AXIS V  GAF: 60 moderate symptoms    PLAN:  1. Affirm with the patient that the medications are taken as ordered. Patient expressed understanding of how their medications were to be used.  2. Continue the following psychiatric medications as written prior to this appointment:  a) Continue Wellbutrin 150 mg.  3. Therapy: brief supportive therapy provided. Discussed her psychosocial stressors. Continue individual therapy.  Again encouraged restarting couples counseling. 4. Risks and benefits, side effects and alternatives  discussed with patient, she was given an opportunity to ask questions about her medication, illness, and treatment. All current psychiatric medications have been reviewed and discussed with the patient and adjusted as clinically appropriate. The patient has been provided an accurate and updated list of the medications being now prescribed.  5. Patient told to call clinic if any problems occur. Patient advised to go to ER if she should develop SI/HI, side effects, or if symptoms worsen. Has crisis numbers to call if needed.  6. No labs warranted at this time. 7. The patient was encouraged to keep all PCP and specialty clinic appointments.  8. Patient was instructed to return to clinic in 3 months.  Will discharge patient back to her PCP. 9. The patient expressed understanding of the plan outlined above and agrees with the plan.  10. Patient informed that April 15th, 2015 would be my last day at this clinic.   Jacqulyn Cane, M.D.  06/13/2013 10:54 AM

## 2013-06-13 NOTE — Telephone Encounter (Signed)
Provided letter of discharge.

## 2013-07-09 ENCOUNTER — Encounter: Payer: Self-pay | Admitting: Family Medicine

## 2013-07-09 ENCOUNTER — Ambulatory Visit (INDEPENDENT_AMBULATORY_CARE_PROVIDER_SITE_OTHER): Payer: BC Managed Care – PPO | Admitting: Family Medicine

## 2013-07-09 VITALS — BP 122/82 | HR 68

## 2013-07-09 DIAGNOSIS — M9981 Other biomechanical lesions of cervical region: Secondary | ICD-10-CM

## 2013-07-09 DIAGNOSIS — M542 Cervicalgia: Secondary | ICD-10-CM

## 2013-07-09 DIAGNOSIS — M546 Pain in thoracic spine: Secondary | ICD-10-CM

## 2013-07-09 DIAGNOSIS — M999 Biomechanical lesion, unspecified: Secondary | ICD-10-CM

## 2013-07-09 NOTE — Assessment & Plan Note (Signed)
Decision today to treat with OMT was based on Physical Exam  After verbal consent patient was treated with HVLa and ME techniques in cervical, thoracic,  lumbar and sacrum areas  Patient tolerated the procedure well with improvement in symptoms  Patient given exercises, stretches and lifestyle modifications  See medications in patient instructions if given  Patient will follow up in 4 weeks        

## 2013-07-09 NOTE — Assessment & Plan Note (Signed)
Patient is doing well. Continue icing, and home exercises. Patient will continue the over-the-counter medications as we've prescribed as well. We discussed lifting position again and how this can be beneficial. Patient was shown shoulder exercises to try to strengthen the shoulder girdle which will take some of the pressure off of her neck as well.

## 2013-07-09 NOTE — Patient Instructions (Addendum)
It is good to see you as always.  I am impressed at the strides you have made overall and you should be proud too.  Watch the diet and see if there is a trigger Shoulder exercises elbow in next to body and rotate across body a s well as out.  3 sets of 10 3 times a week.  Also while standing on band lift straight up to 80 degrees and hold 2 seconds and down slow, 3 sets of 10 as well 3 times a week Choline 500mg  daily for memory.  See me in 4 weeks.

## 2013-07-09 NOTE — Assessment & Plan Note (Signed)
As stated above with neck pain. All muscular skeletal.

## 2013-07-09 NOTE — Progress Notes (Signed)
  CC: Patient is a 48 year old female followup for neck pain  HPI: Patient was seen previously for neck pain. Patient did have osteopathic manipulation states she continues to have some moderate improvement. Patient though was moving furniture again.  Patient states that she is having some mild increase in low back pain. Overall though she continues to take medications and does feel better than her initial exam. Patient continues with integrated medicine as well.  Past medical, surgical, family and social history reviewed. Medications reviewed all in the electronic medical record.   Review of Systems: No headache, visual changes, nausea, vomiting, diarrhea, constipation, dizziness, abdominal pain, skin rash, fevers, chills, night sweats, weight loss, swollen lymph nodes, body aches, joint swelling, muscle aches, chest pain, shortness of breath, mood changes.   Objective:    Blood pressure 122/82, pulse 68, last menstrual period 05/25/2013, SpO2 99.00%.   General: No apparent distress alert and oriented x3 mood and affect normal, dressed appropriately.  HEENT: Pupils equal, extraocular movements intact Respiratory: Patient's speak in full sentences and does not appear short of breath Cardiovascular: No lower extremity edema, non tender, no erythema Skin: Warm dry intact with no signs of infection or rash on extremities or on axial skeleton. Abdomen: Soft nontender Neuro: Cranial nerves II through XII are intact, neurovascularly intact in all extremities with 2+ DTRs and 2+ pulses. Lymph: No lymphadenopathy of posterior or anterior cervical chain or axillae bilaterally.  Gait normal with good balance and coordination.  MSK: Non tender with full range of motion and good stability and symmetric strength and tone of shoulders, elbows, wrist, hip, knee and ankles bilaterally.  Neck: Inspection unremarkable. No palpable stepoffs. Negative Spurling's maneuver. Full neck range of motion Grip  strength and sensation normal in bilateral hands Strength good C4 to T1 distribution No sensory change to C4 to T1 Negative Hoffman sign bilaterally Reflexes normal   OMT Physical Exam   Cervical  C2 flexed rotated and side bent left C4 flexed rotated and side bent right  Thoracic T5 extended rotated and side bent right  Lumbar L2 flexed rotated inside that right  Sacrum Left on left  Illium Neutral   Impression and Recommendations:     This case required medical decision making of moderate complexity.

## 2013-08-06 ENCOUNTER — Encounter: Payer: Self-pay | Admitting: Family Medicine

## 2013-08-06 ENCOUNTER — Ambulatory Visit (INDEPENDENT_AMBULATORY_CARE_PROVIDER_SITE_OTHER): Payer: BC Managed Care – PPO | Admitting: Family Medicine

## 2013-08-06 VITALS — BP 124/80 | HR 69 | Ht 64.0 in | Wt 165.0 lb

## 2013-08-06 DIAGNOSIS — M999 Biomechanical lesion, unspecified: Secondary | ICD-10-CM

## 2013-08-06 DIAGNOSIS — M2140 Flat foot [pes planus] (acquired), unspecified foot: Secondary | ICD-10-CM | POA: Insufficient documentation

## 2013-08-06 DIAGNOSIS — M545 Low back pain, unspecified: Secondary | ICD-10-CM | POA: Insufficient documentation

## 2013-08-06 DIAGNOSIS — M9981 Other biomechanical lesions of cervical region: Secondary | ICD-10-CM

## 2013-08-06 DIAGNOSIS — M542 Cervicalgia: Secondary | ICD-10-CM

## 2013-08-06 NOTE — Assessment & Plan Note (Signed)
Patient's low back pain is multifactorial. Patient has gained some weight since last visit. Patient also has poor core strength. Patient's pes planus is likely contributing to the pain as well and we can consider custom orthotics. Patient has failed other conservative therapy at this time. Patient given another home exercise program focusing on the lower back and rotational component. We discussed proper lifting techniques as well. Patient will follow up again in 2-3 weeks for orthotics and questionable evaluation for manipulation.  Spent greater than 25 minutes with patient face-to-face and had greater than 50% of counseling including as described above in assessment and plan.

## 2013-08-06 NOTE — Assessment & Plan Note (Signed)
Decision today to treat with OMT was based on Physical Exam  After verbal consent patient was treated with HVLa and ME techniques in cervical, thoracic,  lumbar and sacrum areas  Patient tolerated the procedure well with improvement in symptoms  Patient given exercises, stretches and lifestyle modifications  See medications in patient instructions if given  Patient will follow up in 4 weeks        

## 2013-08-06 NOTE — Patient Instructions (Signed)
Good to see you Continue the exercises You are doing great overall.  We will do orthotics soon.  Bring [place] and tennis shoes to check size.  Try new back exercises 3 times a week See me again for manipulation in 5 weeks.

## 2013-08-06 NOTE — Progress Notes (Signed)
  CC: Patient is a 48 year old female followup for neck pain  HPI: Patient was seen previously for neck pain. Patient did have osteopathic manipulation states she continues to have some moderate improvement. Patient does state that she is having some more low back pain. Patient also states that the feet are certainly give her some more problems as well. Patient has tried over-the-counter orthotics and does wear good shoes at work. Patient though notices after 3 days of work she has significant increased amount of pain in the lower back. Patient is not doing the exercises on her regular basis. Patient continues the over-the-counter medications we discussed previously. Overall this pain does not stop her from her regular daily activities but would like to be more closer to pain-free.  Past medical, surgical, family and social history reviewed. Medications reviewed all in the electronic medical record.   Review of Systems: No headache, visual changes, nausea, vomiting, diarrhea, constipation, dizziness, abdominal pain, skin rash, fevers, chills, night sweats, weight loss, swollen lymph nodes, body aches, joint swelling, muscle aches, chest pain, shortness of breath, mood changes.   Objective:    Blood pressure 124/80, pulse 69, height 5\' 4"  (1.626 m), weight 165 lb (74.844 kg), SpO2 99.00%.   General: No apparent distress alert and oriented x3 mood and affect normal, dressed appropriately.  HEENT: Pupils equal, extraocular movements intact Respiratory: Patient's speak in full sentences and does not appear short of breath Cardiovascular: No lower extremity edema, non tender, no erythema Skin: Warm dry intact with no signs of infection or rash on extremities or on axial skeleton. Abdomen: Soft nontender Neuro: Cranial nerves II through XII are intact, neurovascularly intact in all extremities with 2+ DTRs and 2+ pulses. Lymph: No lymphadenopathy of posterior or anterior cervical chain or axillae  bilaterally.  Gait normal with good balance and coordination.  MSK: Non tender with full range of motion and good stability and symmetric strength and tone of shoulders, elbows, wrist, hip, knee and ankles bilaterally.  Neck: Inspection unremarkable. No palpable stepoffs. Negative Spurling's maneuver. Full neck range of motion Grip strength and sensation normal in bilateral hands Strength good C4 to T1 distribution No sensory change to C4 to T1 Negative Hoffman sign bilaterally Reflexes normal   OMT Physical Exam   Cervical  C4 flexed rotated and side bent right  Thoracic T5 extended rotated and side bent right  Lumbar L2 flexed rotated inside that right  Sacrum Left on left  Illium Neutral  Patient's foot exam shows that she does have a Morton's toe bilaterally as well as over pronation of the hindfoot. In addition this patient has had pes planus with what appears to be falling arching of the longitudinal medial arch.   Impression and Recommendations:     This case required medical decision making of moderate complexity.

## 2013-08-20 ENCOUNTER — Ambulatory Visit (INDEPENDENT_AMBULATORY_CARE_PROVIDER_SITE_OTHER): Payer: BC Managed Care – PPO | Admitting: Family Medicine

## 2013-08-20 ENCOUNTER — Encounter: Payer: Self-pay | Admitting: Family Medicine

## 2013-08-20 VITALS — BP 106/80 | HR 97 | Ht 64.0 in | Wt 167.0 lb

## 2013-08-20 DIAGNOSIS — M2140 Flat foot [pes planus] (acquired), unspecified foot: Secondary | ICD-10-CM

## 2013-08-20 DIAGNOSIS — M546 Pain in thoracic spine: Secondary | ICD-10-CM

## 2013-08-20 DIAGNOSIS — M999 Biomechanical lesion, unspecified: Secondary | ICD-10-CM

## 2013-08-20 DIAGNOSIS — M9981 Other biomechanical lesions of cervical region: Secondary | ICD-10-CM

## 2013-08-20 DIAGNOSIS — M542 Cervicalgia: Secondary | ICD-10-CM

## 2013-08-20 NOTE — Assessment & Plan Note (Signed)
Decision today to treat with OMT was based on Physical Exam  After verbal consent patient was treated with HVLa and ME techniques in cervical, thoracic,  lumbar and sacrum areas  Patient tolerated the procedure well with improvement in symptoms  Patient given exercises, stretches and lifestyle modifications  See medications in patient instructions if given  Patient will follow up in 4 weeks

## 2013-08-20 NOTE — Patient Instructions (Signed)
It is great to see you When you get the orthotics wear them for 2 hours first day and increase 1 hour a day until a full day.  I will call you when they are ready.

## 2013-08-20 NOTE — Assessment & Plan Note (Signed)
The patient continues to have thoracic back pain. In addition patient was having more of a migraine secondary to this imbalances. Patient did respond well to osteopathic manipulation today.

## 2013-08-20 NOTE — Assessment & Plan Note (Signed)
Patient get orthotics today. We discussed the use a way of break in the mid over the course of time. We discussed wearing them at work slowly and increasing until waiting for full day. We discussed continuing exercises and icing protocol. Patient will come back again to 3 weeks after starting the regimen and we will nature patient is doing well.

## 2013-08-20 NOTE — Progress Notes (Signed)
  CC: Patient is a 48 year old female followup for back pain and foot pain  HPI: Patient has had back pain as well as the pain multiple times. Patient is here for further manipulation therapy and evaluation as well as potential orthotics. Patient continues to have the same amount of the pain. Patient is also had a migraine that she thinks is secondary to her neck pain. Overall patient was doing much better. Patient has tried changing shoes multiple times for her foot pain but continues to have it. Patient does work and stand for 12 hours at a time.  Past medical, surgical, family and social history reviewed. Medications reviewed all in the electronic medical record.   Review of Systems: No headache, visual changes, nausea, vomiting, diarrhea, constipation, dizziness, abdominal pain, skin rash, fevers, chills, night sweats, weight loss, swollen lymph nodes, body aches, joint swelling, muscle aches, chest pain, shortness of breath, mood changes.   Objective:    Blood pressure 106/80, pulse 97, height 5\' 4"  (1.626 m), weight 167 lb (75.751 kg), SpO2 97.00%.   General: No apparent distress alert and oriented x3 mood and affect normal, dressed appropriately.  HEENT: Pupils equal, extraocular movements intact Respiratory: Patient's speak in full sentences and does not appear short of breath Cardiovascular: No lower extremity edema, non tender, no erythema Skin: Warm dry intact with no signs of infection or rash on extremities or on axial skeleton. Abdomen: Soft nontender Neuro: Cranial nerves II through XII are intact, neurovascularly intact in all extremities with 2+ DTRs and 2+ pulses. Lymph: No lymphadenopathy of posterior or anterior cervical chain or axillae bilaterally.  Gait normal with good balance and coordination.  MSK: Non tender with full range of motion and good stability and symmetric strength and tone of shoulders, elbows, wrist, hip, knee and ankles bilaterally.  Neck: Inspection  unremarkable. No palpable stepoffs. Negative Spurling's maneuver. Full neck range of motion Grip strength and sensation normal in bilateral hands Strength good C4 to T1 distribution No sensory change to C4 to T1 Negative Hoffman sign bilaterally Reflexes normal   OMT Physical Exam   Cervical  C2 flexed rotated and side bent right significant tightness of the musculature.  Thoracic T5 extended rotated and side bent right  Lumbar Neutral  Sacrum Left on left  Illium Neutral  Patient's foot exam shows that she does have a Morton's toe bilaterally as well as over pronation of the hindfoot. In addition this patient has had pes planus with what appears to be falling arching of the longitudinal medial arch.   Patient was fitted for a : standard, cushioned, semi-rigid orthotic. The orthotic was heated and afterward the patient stood on the orthotic blank positioned on the orthotic stand. The patient was positioned in subtalar neutral position and 10 degrees of ankle dorsiflexion in a weight bearing stance. After completion of molding, a stable base was applied to the orthotic blank. The blank was ground to a stable position for weight bearing. Size: 9 Base: Blue EVA Additional Posting and Padding: None The patient ambulated these, and they were very comfortable.  I spent 45 minutes with this patient, greater than 50% was face-to-face time counseling regarding the below diagnosis.   Impression and Recommendations:

## 2013-10-31 ENCOUNTER — Ambulatory Visit (INDEPENDENT_AMBULATORY_CARE_PROVIDER_SITE_OTHER): Payer: BC Managed Care – PPO | Admitting: Family Medicine

## 2013-10-31 ENCOUNTER — Other Ambulatory Visit (INDEPENDENT_AMBULATORY_CARE_PROVIDER_SITE_OTHER): Payer: BC Managed Care – PPO

## 2013-10-31 ENCOUNTER — Encounter: Payer: Self-pay | Admitting: Family Medicine

## 2013-10-31 VITALS — BP 114/84 | HR 82 | Ht 64.0 in | Wt 167.0 lb

## 2013-10-31 DIAGNOSIS — M719 Bursopathy, unspecified: Secondary | ICD-10-CM

## 2013-10-31 DIAGNOSIS — M542 Cervicalgia: Secondary | ICD-10-CM

## 2013-10-31 DIAGNOSIS — M9981 Other biomechanical lesions of cervical region: Secondary | ICD-10-CM

## 2013-10-31 DIAGNOSIS — Z7189 Other specified counseling: Secondary | ICD-10-CM

## 2013-10-31 DIAGNOSIS — M25512 Pain in left shoulder: Secondary | ICD-10-CM

## 2013-10-31 DIAGNOSIS — M25519 Pain in unspecified shoulder: Secondary | ICD-10-CM

## 2013-10-31 DIAGNOSIS — M7552 Bursitis of left shoulder: Secondary | ICD-10-CM | POA: Insufficient documentation

## 2013-10-31 DIAGNOSIS — M67919 Unspecified disorder of synovium and tendon, unspecified shoulder: Secondary | ICD-10-CM

## 2013-10-31 DIAGNOSIS — M999 Biomechanical lesion, unspecified: Secondary | ICD-10-CM

## 2013-10-31 DIAGNOSIS — Z630 Problems in relationship with spouse or partner: Secondary | ICD-10-CM

## 2013-10-31 MED ORDER — HYDROXYZINE HCL 25 MG PO TABS: 25.0000 mg | ORAL_TABLET | Freq: Three times a day (TID) | ORAL | Status: DC | PRN

## 2013-10-31 NOTE — Progress Notes (Signed)
CC: Patient is a 48 year old female followup for neck pain  HPI: Patient was seen previously for neck pain. Patient did have osteopathic manipulation states she continues to have some moderate improvement. Patient continues to try to do the exercises on her regular routine. Patient though has had increasing stress and is having separation with her husband. The discussed that this is making her had increasing site he. Patient states that this has made her more sore overall.   patient is also complaining of new shoulder pain. Patient states that she is a dull aching pain in the shoulder. Worse when she tries to reach in an abducted state. Patient has been taking some ibuprofen with mild improvement. Patient states the pain on that side is very difficult. Denies any radiation down the arm or any numbness. Patient rates the severity is 7/10. Has woken her up at night.  Past medical, surgical, family and social history reviewed. Medications reviewed all in the electronic medical record.   Review of Systems: No headache, visual changes, nausea, vomiting, diarrhea, constipation, dizziness, abdominal pain, skin rash, fevers, chills, night sweats, weight loss, swollen lymph nodes, body aches, joint swelling, muscle aches, chest pain, shortness of breath, mood changes.   Objective:    Blood pressure 114/84, pulse 82, height  (1.626 m), weight 167 lb (75.751 kg), SpO2 96.00%.   General: No apparent distress alert and oriented x3 mood and affect normal, dressed appropriately.  HEENT: Pupils equal, extraocular movements intact Respiratory: Patient's speak in full sentences and does not appear short of breath Cardiovascular: No lower extremity edema, non tender, no erythema Skin: Warm dry intact with no signs of infection or rash on extremities or on axial skeleton. Abdomen: Soft nontender Neuro: Cranial nerves II through XII are intact, neurovascularly intact in all extremities with 2+ DTRs and 2+  pulses. Lymph: No lymphadenopathy of posterior or anterior cervical chain or axillae bilaterally.  Gait normal with good balance and coordination.  MSK: Non tender with full range of motion and good stability and symmetric strength and tone of , elbows, wrist, hip, knee and ankles bilaterally.  Neck: Inspection unremarkable. No palpable stepoffs. Negative Spurling's maneuver. Full neck range of motion Grip strength and sensation normal in bilateral hands Strength good C4 to T1 distribution No sensory change to C4 to T1 Negative Hoffman sign bilaterally Reflexes normal Shoulder: left Inspection reveals no abnormalities, atrophy or asymmetry. Palpation is normal with no tenderness over AC joint or bicipital groove. ROM is full in all planes passively. Rotator cuff strength normal throughout. signs of impingement with positive Neer and Hawkin's tests, but negative empty can sign. Speeds and Yergason's tests normal. No labral pathology noted with negative Obrien's, negative clunk and good stability. Normal scapular function observed. No painful arc and no drop arm sign. No apprehension sign  MSK US performed of: left This study was ordered, performed, and interpreted by Terrilee Files D.O.  Shoulder:   Supraspinatus:  Appears normal on long and transverse views, Bursal bulge seen with shoulder abduction on impingement view. Infraspinatus:  Appears normal on long and transverse views. Significant increase in Doppler flow Subscapularis:  Appears normal on long and transverse views. Positive bursa Teres Minor:  Appears normal on long and transverse views. AC joint:  Capsule undistended, no geyser sign. Glenohumeral Joint:  Appears normal without effusion. Glenoid Labrum:  Intact without visualized tears. Biceps Tendon:  Appears normal on long and transverse views, no fraying of tendon, tendon located in intertubercular groove, no subluxation with  shoulder internal or external  rotation.  Impression: Subacromial bursitis    OMT Physical Exam   Cervical  C4 flexed rotated and side bent right  Thoracic T5 extended rotated and side bent right  Lumbar L2 flexed rotated inside that right  Sacrum Left on left  Illium Neutral    Impression and Recommendations:     This case required medical decision making of moderate complexity.

## 2013-10-31 NOTE — Patient Instructions (Signed)
Good to see you New exercises for your shoulder Pennsaid 2 times daily. For 5 days then as needed Ice is always your friend.  Hydroxyzine up to 3 times daily.  See me again in 3 weeks.

## 2013-10-31 NOTE — Assessment & Plan Note (Signed)
Patient is in the beginning some divorce. Patient was given hydroxyzine for the anxiety. Patient will try this and will followup with primary care provider.

## 2013-10-31 NOTE — Assessment & Plan Note (Signed)
Patient does have some mild shoulder bursitis. Patient did not want an injection today. Patient will do a trial of topical anti-inflammatories, icing and home exercises. Patient will follow up again in 3 weeks for further evaluation. Severity of this problem is moderate.

## 2013-10-31 NOTE — Assessment & Plan Note (Signed)
Decision today to treat with OMT was based on Physical Exam  After verbal consent patient was treated with HVLa and ME techniques in cervical, thoracic,  lumbar and sacrum areas  Patient tolerated the procedure well with improvement in symptoms  Patient given exercises, stretches and lifestyle modifications  See medications in patient instructions if given  Patient will follow up in 3 weeks

## 2013-10-31 NOTE — Assessment & Plan Note (Signed)
The patient is having an exacerbation of her neck pain. I think his neck pain is secondary to most of her anxiety. Patient did respond fairly well to the manipulation today. Patient was given medication to help her with any type of muscle spasms. Patient was given home exercise program medical be beneficial as well. Patient will try these interventions and come back again in 3-4 weeks for further evaluation and treatment.

## 2013-11-26 ENCOUNTER — Ambulatory Visit (INDEPENDENT_AMBULATORY_CARE_PROVIDER_SITE_OTHER): Payer: BC Managed Care – PPO | Admitting: Family Medicine

## 2013-11-26 ENCOUNTER — Other Ambulatory Visit (INDEPENDENT_AMBULATORY_CARE_PROVIDER_SITE_OTHER): Payer: BC Managed Care – PPO

## 2013-11-26 ENCOUNTER — Encounter: Payer: Self-pay | Admitting: Family Medicine

## 2013-11-26 VITALS — BP 112/80 | HR 84 | Ht 64.0 in | Wt 165.0 lb

## 2013-11-26 DIAGNOSIS — M25519 Pain in unspecified shoulder: Secondary | ICD-10-CM

## 2013-11-26 DIAGNOSIS — M25512 Pain in left shoulder: Secondary | ICD-10-CM

## 2013-11-26 DIAGNOSIS — M9981 Other biomechanical lesions of cervical region: Secondary | ICD-10-CM

## 2013-11-26 DIAGNOSIS — M67919 Unspecified disorder of synovium and tendon, unspecified shoulder: Secondary | ICD-10-CM

## 2013-11-26 DIAGNOSIS — M7552 Bursitis of left shoulder: Secondary | ICD-10-CM

## 2013-11-26 DIAGNOSIS — M999 Biomechanical lesion, unspecified: Secondary | ICD-10-CM

## 2013-11-26 DIAGNOSIS — M719 Bursopathy, unspecified: Secondary | ICD-10-CM

## 2013-11-26 NOTE — Progress Notes (Signed)
CC: Patient is a 48 year old female followup for neck pain  HPI: Patient was seen previously for neck pain. Patient did have osteopathic manipulation states she continues to have some moderate improvement. Patient states that overall she has been feeling better with decreasing Zyban or life. States that she continues to try to do the exercises fairly regularly but has been inconsistent. Denies any new symptoms in the neck.   patient unfortunately continues to have left shoulder pain as well. States that it gives her discomfort at night. Patient states it does respond to ibuprofen but once he stops taking the anti-inflammatory. States that the topical anti-inflammatory was not helpful. Denies any associated neck pain and states that this feels different. Still dull and throbbing in denies any weakness.  Past medical, surgical, family and social history reviewed. Medications reviewed all in the electronic medical record.   Review of Systems: No headache, visual changes, nausea, vomiting, diarrhea, constipation, dizziness, abdominal pain, skin rash, fevers, chills, night sweats, weight loss, swollen lymph nodes, body aches, joint swelling, muscle aches, chest pain, shortness of breath, mood changes.   Objective:    Blood pressure 112/80, pulse 84, height  (1.626 m), weight 165 lb (74.844 kg), SpO2 98.00%.   General: No apparent distress alert and oriented x3 mood and affect normal, dressed appropriately.  HEENT: Pupils equal, extraocular movements intact Respiratory: Patient's speak in full sentences and does not appear short of breath Cardiovascular: No lower extremity edema, non tender, no erythema Skin: Warm dry intact with no signs of infection or rash on extremities or on axial skeleton. Abdomen: Soft nontender Neuro: Cranial nerves II through XII are intact, neurovascularly intact in all extremities with 2+ DTRs and 2+ pulses. Lymph: No lymphadenopathy of posterior or anterior cervical  chain or axillae bilaterally.  Gait normal with good balance and coordination.  MSK: Non tender with full range of motion and good stability and symmetric strength and tone of , elbows, wrist, hip, knee and ankles bilaterally.  Neck: Inspection unremarkable. No palpable stepoffs. Negative Spurling's maneuver. Full neck range of motion Grip strength and sensation normal in bilateral hands Strength good C4 to T1 distribution No sensory change to C4 to T1 Negative Hoffman sign bilaterally Reflexes normal Shoulder: left Inspection reveals no abnormalities, atrophy or asymmetry. Palpation is normal with no tenderness over AC joint or bicipital groove. ROM is full in all planes passively. Rotator cuff strength normal throughout. signs of impingement with positive Neer and Hawkin's tests, but negative empty can sign. No change from previous exam Speeds and Yergason's tests normal. No labral pathology noted with negative Obrien's, negative clunk and good stability. Normal scapular function observed. No painful arc and no drop arm sign. No apprehension sign Contralateral shoulder unremarkable  Procedure: Real-time Ultrasound Guided Injection of left glenohumeral joint Device: GE Logiq E  Ultrasound guided injection is preferred based studies that show increased duration, increased effect, greater accuracy, decreased procedural pain, increased response rate with ultrasound guided versus blind injection.  Verbal informed consent obtained.  Time-out conducted.  Noted no overlying erythema, induration, or other signs of local infection.  Skin prepped in a sterile fashion.  Local anesthesia: Topical Ethyl chloride.  With sterile technique and under real time ultrasound guidance:  Joint visualized.  23g 1  inch needle inserted posterior approach. Pictures taken for needle placement. Patient did have injection of 2 cc of 1% lidocaine, 2 cc of 0.5% Marcaine, and 1cc of Kenalog 40 mg/dL. Completed  without difficulty  Pain immediately  resolved suggesting accurate placement of the medication.  Advised to call if fevers/chills, erythema, induration, drainage, or persistent bleeding.  Images permanently stored and available for review in the ultrasound unit.  Impression: Technically successful ultrasound guided injection.     OMT Physical Exam   Cervical  C4 flexed rotated and side bent right  Thoracic T5 extended rotated and side bent right  Lumbar L2 flexed rotated inside that right  Sacrum Left on left  Illium Neutral    Impression and Recommendations:     This case required medical decision making of moderate complexity.

## 2013-11-26 NOTE — Assessment & Plan Note (Signed)
Decision today to treat with OMT was based on Physical Exam  After verbal consent patient was treated with HVLa and ME techniques in cervical, thoracic,  lumbar and sacrum areas  Patient tolerated the procedure well with improvement in symptoms  Patient given exercises, stretches and lifestyle modifications  See medications in patient instructions if given  Patient will follow up in 5 weeks

## 2013-11-26 NOTE — Patient Instructions (Signed)
Good to see you Good luck with the house.  Ice in 6 hours.  Continue the exercises for the shoulder 3 times a week Continue the medications.  See me again 5 weeks.

## 2013-11-26 NOTE — Assessment & Plan Note (Signed)
On the differential injection done today. Patient was given home exercise program and we'll work more on strengthening at this time. Denied any referral to physical therapy. Patient will continue with the icing as well. Patient will follow up in 4-5 weeks for further evaluation and treatment.

## 2013-12-02 ENCOUNTER — Encounter: Payer: Self-pay | Admitting: Family Medicine

## 2013-12-04 ENCOUNTER — Other Ambulatory Visit: Payer: Self-pay | Admitting: Family Medicine

## 2013-12-04 ENCOUNTER — Ambulatory Visit (INDEPENDENT_AMBULATORY_CARE_PROVIDER_SITE_OTHER)
Admission: RE | Admit: 2013-12-04 | Discharge: 2013-12-04 | Disposition: A | Discharge status: Home / Self Care | Payer: BC Managed Care – PPO | Source: Ambulatory Visit | Attending: Family Medicine | Admitting: Family Medicine

## 2013-12-04 DIAGNOSIS — M25519 Pain in unspecified shoulder: Secondary | ICD-10-CM

## 2013-12-04 DIAGNOSIS — M542 Cervicalgia: Principal | ICD-10-CM

## 2013-12-31 ENCOUNTER — Ambulatory Visit (INDEPENDENT_AMBULATORY_CARE_PROVIDER_SITE_OTHER): Payer: BC Managed Care – PPO | Admitting: Family Medicine

## 2013-12-31 ENCOUNTER — Encounter: Payer: Self-pay | Admitting: Family Medicine

## 2013-12-31 VITALS — BP 122/82 | HR 61 | Ht 64.0 in | Wt 166.0 lb

## 2013-12-31 DIAGNOSIS — M999 Biomechanical lesion, unspecified: Secondary | ICD-10-CM

## 2013-12-31 DIAGNOSIS — M9903 Segmental and somatic dysfunction of lumbar region: Secondary | ICD-10-CM

## 2013-12-31 DIAGNOSIS — M9908 Segmental and somatic dysfunction of rib cage: Secondary | ICD-10-CM

## 2013-12-31 DIAGNOSIS — M9901 Segmental and somatic dysfunction of cervical region: Secondary | ICD-10-CM

## 2013-12-31 DIAGNOSIS — M9902 Segmental and somatic dysfunction of thoracic region: Secondary | ICD-10-CM

## 2013-12-31 DIAGNOSIS — M7552 Bursitis of left shoulder: Secondary | ICD-10-CM

## 2013-12-31 DIAGNOSIS — M542 Cervicalgia: Secondary | ICD-10-CM

## 2013-12-31 NOTE — Assessment & Plan Note (Signed)
Decision today to treat with OMT was based on Physical Exam  After verbal consent patient was treated with HVLa and ME techniques in cervical, thoracic,rib   lumbar and sacrum areas  Patient tolerated the procedure well with improvement in symptoms  Patient given exercises, stretches and lifestyle modifications  See medications in patient instructions if given  Patient will follow up in 3 weeks

## 2013-12-31 NOTE — Progress Notes (Signed)
  CC: Patient is a 48 year old female followup for neck pain  HPI: Patient was seen previously for neck pain. Patient did have osteopathic manipulation states she continues to have some moderate improvement. Patient continues to have significant stresses in life that could be contribute in. Due to the increasing pain in patient's neck x-rays were ordered. Patient's x-ray of her neck were unremarkable with no significant bony abnormality. Patient states that she still has chronic pain and seems to be more on the left side.   patient was seen also for left shoulder pain. We do not know if this is radicular symptoms from the neck or her shoulder specifically. Patient was given an injection under ultrasound guidance. Patient tolerated the procedure well. Patient states though that she did not have any significant improvement. Continues to have pain mostly in the posterior aspect of the neck. States that possibly the radiation down her arm is improved.  Past medical, surgical, family and social history reviewed. Medications reviewed all in the electronic medical record.   Review of Systems: No headache, visual changes, nausea, vomiting, diarrhea, constipation, dizziness, abdominal pain, skin rash, fevers, chills, night sweats, weight loss, swollen lymph nodes, body aches, joint swelling, muscle aches, chest pain, shortness of breath, mood changes.   Objective:    Blood pressure 122/82, pulse 61, height 5\' 4"  (1.626 m), weight 166 lb (75.297 kg), last menstrual period 11/06/2013, SpO2 98.00%.   General: No apparent distress alert and oriented x3 mood and affect normal, dressed appropriately.  HEENT: Pupils equal, extraocular movements intact Respiratory: Patient's speak in full sentences and does not appear short of breath Cardiovascular: No lower extremity edema, non tender, no erythema Skin: Warm dry intact with no signs of infection or rash on extremities or on axial skeleton. Abdomen: Soft  nontender Neuro: Cranial nerves II through XII are intact, neurovascularly intact in all extremities with 2+ DTRs and 2+ pulses. Lymph: No lymphadenopathy of posterior or anterior cervical chain or axillae bilaterally.  Gait normal with good balance and coordination.  MSK: Non tender with full range of motion and good stability and symmetric strength and tone of , elbows, wrist, hip, knee and ankles bilaterally.  Neck: Inspection unremarkable. No palpable stepoffs. Negative Spurling's maneuver. Full neck range of motion Grip strength and sensation normal in bilateral hands Strength good C4 to T1 distribution No sensory change to C4 to T1 Negative Hoffman sign bilaterally Reflexes normal Shoulder: left Inspection reveals no abnormalities, atrophy or asymmetry. Palpation is normal with no tenderness over AC joint or bicipital groove. ROM is full in all planes passively. Rotator cuff strength normal throughout. signs of impingement with positive Neer and Hawkin's tests, but negative empty can sign. No change from previous exam Speeds and Yergason's tests normal. No labral pathology noted with negative Obrien's, negative clunk and good stability. Normal scapular function observed. No painful arc and no drop arm sign. No apprehension sign Contralateral shoulder unremarkable    OMT Physical Exam   Cervical  C4 flexed rotated and side bent right  Thoracic G2 extended rotated and side bent left with inhaled second rib T5 extended rotated and side bent right  Lumbar L2 flexed rotated inside that right  Sacrum Left on left  Illium Neutral    Impression and Recommendations:     This case required medical decision making of moderate complexity.

## 2013-12-31 NOTE — Patient Instructions (Addendum)
Good to see you You are doing well and xrays are better.  Consider rhinocort.  Conitnue the exercises Would have teeth looked at. Give it a couple days and if not better we will get MRI of shoulder and neck.  Otherwsie see me agian in 3-4 weeks.

## 2013-12-31 NOTE — Assessment & Plan Note (Signed)
Patient continues to have significant amount of stressors in her life as well as patient's past medical history significant for depression and likely is contributing to some of her pain overall. Patient's x-rays were normal but continues to have this discomfort in the neck in the upper shoulder. There is a concern for potential radicular symptoms and further imaging such an MRI may need to be done. Discussed with patient that we like to avoid this but if in the next week or 2 she starts having worsening pain I would order an have patient come back 1-2 days afterwards to further evaluate. Otherwise patient will come back in 3 weeks and will continue with the conservative therapy. We did discuss changes and working position that can be beneficial.

## 2013-12-31 NOTE — Assessment & Plan Note (Signed)
With patient on making any significant improvement after the injection I would consider patient may be have a labral tear. At the same time we will get an MRI of the neck I would consider an MRI of the shoulder to rule out this pathology as well. Patient does have some clicking and popping but overall no significant labral pathology and only signs of impingement.

## 2014-01-07 ENCOUNTER — Encounter: Payer: Self-pay | Admitting: Family Medicine

## 2014-01-07 DIAGNOSIS — M999 Biomechanical lesion, unspecified: Secondary | ICD-10-CM

## 2014-01-07 DIAGNOSIS — M7552 Bursitis of left shoulder: Secondary | ICD-10-CM

## 2014-01-10 NOTE — Telephone Encounter (Signed)
Order entered

## 2014-01-16 ENCOUNTER — Ambulatory Visit: Payer: Self-pay | Admitting: Physical Therapy

## 2014-01-21 ENCOUNTER — Ambulatory Visit (INDEPENDENT_AMBULATORY_CARE_PROVIDER_SITE_OTHER): Payer: BC Managed Care – PPO | Admitting: Physical Therapy

## 2014-01-21 DIAGNOSIS — M6281 Muscle weakness (generalized): Secondary | ICD-10-CM

## 2014-01-21 DIAGNOSIS — M7552 Bursitis of left shoulder: Secondary | ICD-10-CM

## 2014-01-21 DIAGNOSIS — M9901 Segmental and somatic dysfunction of cervical region: Secondary | ICD-10-CM

## 2014-01-21 DIAGNOSIS — M25519 Pain in unspecified shoulder: Secondary | ICD-10-CM

## 2014-01-23 ENCOUNTER — Encounter: Payer: Self-pay | Admitting: Family Medicine

## 2014-01-23 ENCOUNTER — Ambulatory Visit (INDEPENDENT_AMBULATORY_CARE_PROVIDER_SITE_OTHER): Payer: BC Managed Care – PPO | Admitting: Family Medicine

## 2014-01-23 VITALS — BP 116/78 | HR 80 | Ht 64.0 in | Wt 164.0 lb

## 2014-01-23 DIAGNOSIS — M9902 Segmental and somatic dysfunction of thoracic region: Secondary | ICD-10-CM

## 2014-01-23 DIAGNOSIS — M9908 Segmental and somatic dysfunction of rib cage: Secondary | ICD-10-CM

## 2014-01-23 DIAGNOSIS — M542 Cervicalgia: Secondary | ICD-10-CM

## 2014-01-23 DIAGNOSIS — M9901 Segmental and somatic dysfunction of cervical region: Secondary | ICD-10-CM

## 2014-01-23 DIAGNOSIS — M999 Biomechanical lesion, unspecified: Secondary | ICD-10-CM

## 2014-01-23 DIAGNOSIS — M546 Pain in thoracic spine: Secondary | ICD-10-CM

## 2014-01-23 DIAGNOSIS — M9903 Segmental and somatic dysfunction of lumbar region: Secondary | ICD-10-CM

## 2014-01-23 NOTE — Assessment & Plan Note (Signed)
Decision today to treat with OMT was based on Physical Exam  After verbal consent patient was treated with HVLa and ME techniques in cervical, thoracic,rib   lumbar and sacrum areas, discussed other exercises as well.   Patient tolerated the procedure well with improvement in symptoms  Patient given exercises, stretches and lifestyle modifications  See medications in patient instructions if given  Patient will follow up in 4 weeks

## 2014-01-23 NOTE — Patient Instructions (Signed)
Good to see you Iron, vitamin C and biotin may help with the nails and hair.  Continue with PT Continue the exercises and the tennis balls See me again in 4 weeks.

## 2014-01-23 NOTE — Progress Notes (Signed)
  CC: Patient is a 48 year old female followup for neck pain  HPI: Patient was seen previously for neck pain. Patient did have osteopathic manipulation states she continues to have some moderate improvement. 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 Patient though is not when he to have any advanced imaging at this time. Continues to be a chronic problem but not stopping her from any of her daily activities. No new significant symptoms. Patient has started with formal physical therapy and states that this seems to be helping. States still having some mild discomfort of the left shoulder.  Doing better with her anxiety and using the hydroxyzine significantly less.   .  Past medical, surgical, family and social history reviewed. Medications reviewed all in the electronic medical record.   Review of Systems: No headache, visual changes, nausea, vomiting, diarrhea, constipation, dizziness, abdominal pain, skin rash, fevers, chills, night sweats, weight loss, swollen lymph nodes, body aches, joint swelling, muscle aches, chest pain, shortness of breath, mood changes.   Objective:    Blood pressure 116/78, pulse 80, height 5\' 4"  (1.626 m), weight 164 lb (74.39 kg), SpO2 97 %.   General: No apparent distress alert and oriented x3 mood and affect normal, dressed appropriately.  HEENT: Pupils equal, extraocular movements intact Respiratory: Patient's speak in full sentences and does not appear short of breath Cardiovascular: No lower extremity edema, non tender, no erythema Skin: Warm dry intact with no signs of infection or rash on extremities or on axial skeleton. Abdomen: Soft nontender Neuro: Cranial nerves II through XII are intact, neurovascularly intact in all extremities with 2+ DTRs and 2+ pulses. Lymph: No lymphadenopathy of posterior or anterior cervical chain or axillae bilaterally.  Gait normal with good balance and coordination.  MSK: Non tender with full range of  motion and good stability and symmetric strength and tone of , elbows, wrist, hip, knee and ankles bilaterally.  Neck: Inspection unremarkable. No palpable stepoffs. Negative Spurling's maneuver. Full neck range of motion Grip strength and sensation normal in bilateral hands Strength good C4 to T1 distribution No sensory change to C4 to T1 Negative Hoffman sign bilaterally Reflexes normal No change from previous exam Shoulder: left Inspection reveals no abnormalities, atrophy or asymmetry. Palpation is normal with no tenderness over AC joint or bicipital groove. ROM is full in all planes passively. Rotator cuff strength normal throughout. signs of impingement with positive Neer and Hawkin's tests, but negative empty can sign. No change from previous exam Speeds and Yergason's tests normal. No labral pathology noted with negative Obrien's, negative clunk and good stability. Normal scapular function observed. No painful arc and no drop arm sign. No apprehension sign Contralateral shoulder unremarkable Continues to be patient's baseline    OMT Physical Exam  Cervical  C4 flexed rotated and side bent right C2 flexed rotated and side bent left  Thoracic T2 extended rotated and side bent left with inhaled second rib T5 extended rotated and side bent right  Lumbar L2 flexed rotated inside that right  Sacrum Left on left  Illium Neutral   Impression and Recommendations:     This case required medical decision making of moderate complexity.

## 2014-01-23 NOTE — Assessment & Plan Note (Signed)
Patient also has bilateral neck pain. X-rays have revealed some mild osteophytic changes the patient does not want advance imaging at this time. Patient is responding to conservative therapy and encourage her to do the exercises on a more regular basis. We discussed again the topical anti-inflammatory and how this can be beneficial. Patient will come back and see me again in 3-4 weeks for further evaluation and treatment.

## 2014-01-24 ENCOUNTER — Encounter (INDEPENDENT_AMBULATORY_CARE_PROVIDER_SITE_OTHER): Payer: BC Managed Care – PPO | Admitting: Physical Therapy

## 2014-01-24 DIAGNOSIS — M6281 Muscle weakness (generalized): Secondary | ICD-10-CM

## 2014-01-24 DIAGNOSIS — M9901 Segmental and somatic dysfunction of cervical region: Secondary | ICD-10-CM

## 2014-01-24 DIAGNOSIS — M25519 Pain in unspecified shoulder: Secondary | ICD-10-CM

## 2014-01-24 DIAGNOSIS — M7552 Bursitis of left shoulder: Secondary | ICD-10-CM

## 2014-01-28 ENCOUNTER — Encounter (INDEPENDENT_AMBULATORY_CARE_PROVIDER_SITE_OTHER): Payer: BC Managed Care – PPO | Admitting: Physical Therapy

## 2014-01-28 DIAGNOSIS — M25519 Pain in unspecified shoulder: Secondary | ICD-10-CM

## 2014-01-28 DIAGNOSIS — M7552 Bursitis of left shoulder: Secondary | ICD-10-CM

## 2014-01-28 DIAGNOSIS — M6281 Muscle weakness (generalized): Secondary | ICD-10-CM

## 2014-02-04 ENCOUNTER — Encounter (INDEPENDENT_AMBULATORY_CARE_PROVIDER_SITE_OTHER): Payer: BC Managed Care – PPO | Admitting: Physical Therapy

## 2014-02-04 DIAGNOSIS — M6281 Muscle weakness (generalized): Secondary | ICD-10-CM

## 2014-02-04 DIAGNOSIS — M25519 Pain in unspecified shoulder: Secondary | ICD-10-CM

## 2014-02-04 DIAGNOSIS — M7552 Bursitis of left shoulder: Secondary | ICD-10-CM

## 2014-02-04 DIAGNOSIS — M9901 Segmental and somatic dysfunction of cervical region: Secondary | ICD-10-CM

## 2014-02-06 ENCOUNTER — Encounter (INDEPENDENT_AMBULATORY_CARE_PROVIDER_SITE_OTHER): Payer: BC Managed Care – PPO | Admitting: Physical Therapy

## 2014-02-06 DIAGNOSIS — M25519 Pain in unspecified shoulder: Secondary | ICD-10-CM

## 2014-02-06 DIAGNOSIS — M7552 Bursitis of left shoulder: Secondary | ICD-10-CM

## 2014-02-06 DIAGNOSIS — M6281 Muscle weakness (generalized): Secondary | ICD-10-CM

## 2014-02-06 DIAGNOSIS — M9901 Segmental and somatic dysfunction of cervical region: Secondary | ICD-10-CM

## 2014-02-10 ENCOUNTER — Encounter (INDEPENDENT_AMBULATORY_CARE_PROVIDER_SITE_OTHER): Payer: BC Managed Care – PPO | Admitting: Physical Therapy

## 2014-02-10 DIAGNOSIS — M6281 Muscle weakness (generalized): Secondary | ICD-10-CM

## 2014-02-10 DIAGNOSIS — M9901 Segmental and somatic dysfunction of cervical region: Secondary | ICD-10-CM

## 2014-02-10 DIAGNOSIS — M25519 Pain in unspecified shoulder: Secondary | ICD-10-CM

## 2014-02-12 ENCOUNTER — Encounter (INDEPENDENT_AMBULATORY_CARE_PROVIDER_SITE_OTHER): Payer: BC Managed Care – PPO | Admitting: Physical Therapy

## 2014-02-12 DIAGNOSIS — M9901 Segmental and somatic dysfunction of cervical region: Secondary | ICD-10-CM

## 2014-02-12 DIAGNOSIS — M7552 Bursitis of left shoulder: Secondary | ICD-10-CM

## 2014-02-18 ENCOUNTER — Encounter (INDEPENDENT_AMBULATORY_CARE_PROVIDER_SITE_OTHER): Payer: BC Managed Care – PPO | Admitting: Physical Therapy

## 2014-02-18 DIAGNOSIS — M6281 Muscle weakness (generalized): Secondary | ICD-10-CM

## 2014-02-18 DIAGNOSIS — M7552 Bursitis of left shoulder: Secondary | ICD-10-CM

## 2014-02-18 DIAGNOSIS — M9901 Segmental and somatic dysfunction of cervical region: Secondary | ICD-10-CM

## 2014-02-18 DIAGNOSIS — M25519 Pain in unspecified shoulder: Secondary | ICD-10-CM

## 2014-02-20 ENCOUNTER — Encounter: Payer: Self-pay | Admitting: Family Medicine

## 2014-02-20 ENCOUNTER — Ambulatory Visit (INDEPENDENT_AMBULATORY_CARE_PROVIDER_SITE_OTHER): Payer: BC Managed Care – PPO | Admitting: Family Medicine

## 2014-02-20 VITALS — BP 126/80 | HR 84 | Ht 64.0 in | Wt 168.0 lb

## 2014-02-20 DIAGNOSIS — M7552 Bursitis of left shoulder: Secondary | ICD-10-CM

## 2014-02-20 DIAGNOSIS — M9903 Segmental and somatic dysfunction of lumbar region: Secondary | ICD-10-CM

## 2014-02-20 DIAGNOSIS — M542 Cervicalgia: Secondary | ICD-10-CM

## 2014-02-20 DIAGNOSIS — M999 Biomechanical lesion, unspecified: Secondary | ICD-10-CM

## 2014-02-20 DIAGNOSIS — M9908 Segmental and somatic dysfunction of rib cage: Secondary | ICD-10-CM

## 2014-02-20 DIAGNOSIS — M9901 Segmental and somatic dysfunction of cervical region: Secondary | ICD-10-CM

## 2014-02-20 NOTE — Assessment & Plan Note (Signed)
Decision today to treat with OMT was based on Physical Exam  After verbal consent patient was treated with HVLa and ME techniques in cervical, thoracic,rib   lumbar and sacrum areas, discussed other exercises as well.   Patient tolerated the procedure well with improvement in symptoms  Patient given exercises, stretches and lifestyle modifications  See medications in patient instructions if given  Patient will follow up in 6 weeks

## 2014-02-20 NOTE — Assessment & Plan Note (Signed)
Patient continues to have some mild pain. We discussed once again if continuing to have pain further imaging will be necessary. Patient declines MRI because she she is doing fairly well. Patient has changed her sleeping position and is trying to make other changes at work. Patient is wearing good shoes and trying to remain active. Patient will continue with the osteopathic manipulation and we'll see me again in 6 weeks for further evaluation and treatment.

## 2014-02-20 NOTE — Patient Instructions (Signed)
Nice T shirt! Ice is still your friend.  Keep trucking along.  On step, drop heels, then up on toes hold for 2 seconds, down slow for count of 4 seconds, 30 reps daily.  Finish with physical therapy See me again in 6 weeks.

## 2014-02-20 NOTE — Progress Notes (Signed)
  CC: Patient is a 48 year old female followup for neck pain  HPI: Patient was seen previously for neck pain. Patient did have osteopathic manipulation states she continues to have some moderate improvement. Patient has been going to formal physical therapy for her shoulder as well as for her neck. Patient is no some mild improvement. Patient denies any significant new symptoms. In some mild dull aching pain in the lower back but nothing that is causing her to stop any activity. Continues with the natural supplementations denies any numbness or tearing.    .  Past medical, surgical, family and social history reviewed. Medications reviewed all in the electronic medical record.   Review of Systems: No headache, visual changes, nausea, vomiting, diarrhea, constipation, dizziness, abdominal pain, skin rash, fevers, chills, night sweats, weight loss, swollen lymph nodes, body aches, joint swelling, muscle aches, chest pain, shortness of breath, mood changes.   Objective:    Blood pressure 126/80, pulse 84, height 5\' 4"  (1.626 m), weight 168 lb (76.204 kg), SpO2 97 %.   General: No apparent distress alert and oriented x3 mood and affect normal, dressed appropriately.  HEENT: Pupils equal, extraocular movements intact Respiratory: Patient's speak in full sentences and does not appear short of breath Cardiovascular: No lower extremity edema, non tender, no erythema Skin: Warm dry intact with no signs of infection or rash on extremities or on axial skeleton. Abdomen: Soft nontender Neuro: Cranial nerves II through XII are intact, neurovascularly intact in all extremities with 2+ DTRs and 2+ pulses. Lymph: No lymphadenopathy of posterior or anterior cervical chain or axillae bilaterally.  Gait normal with good balance and coordination.  MSK: Non tender with full range of motion and good stability and symmetric strength and tone of , elbows, wrist, hip, knee and ankles bilaterally.  Neck: Inspection  unremarkable. No palpable stepoffs. Negative Spurling's maneuver. Full neck range of motion Grip strength and sensation normal in bilateral hands Strength good C4 to T1 distribution No sensory change to C4 to T1 Negative Hoffman sign bilaterally Reflexes normal No change from previous exam Shoulder: left Inspection reveals no abnormalities, atrophy or asymmetry. Palpation is normal with no tenderness over AC joint or bicipital groove. ROM is full in all planes passively. Rotator cuff strength normal throughout. signs of impingement with positive Neer and Hawkin's tests, but negative empty can sign. No change from previous exam Speeds and Yergason's tests normal. No labral pathology noted with negative Obrien's, negative clunk and good stability. Normal scapular function observed. No painful arc and no drop arm sign. No apprehension sign Contralateral shoulder unremarkable Continues to be patient's baseline    OMT Physical Exam  Cervical  C2 flexed rotated and side bent left C4 flexed rotated and side bent right   Thoracic T2 extended rotated and side bent left with inhaled second rib T5 extended rotated and side bent right  Lumbar L2 flexed rotated inside that right  Sacrum Left on left  Illium Neutral  Impression and Recommendations:     This case required medical decision making of moderate complexity.

## 2014-02-20 NOTE — Assessment & Plan Note (Signed)
Discussed the possibility of another injection in the shoulder which patient declined. We also discussed icing regimen. Patient will continue with formal physical therapy for another 4 weeks. We discussed with patient that external rotation can be painful and will take a little more time to fully come back. Differential includes a labral pathology but this is not stopping her from any activity and I do not think she would do an elective procedure. Patient will continue as weeks discussed with physical therapy and come back and see me again in 6 weeks for further evaluation.

## 2014-02-26 ENCOUNTER — Encounter (INDEPENDENT_AMBULATORY_CARE_PROVIDER_SITE_OTHER): Payer: BC Managed Care – PPO | Admitting: Physical Therapy

## 2014-02-26 DIAGNOSIS — M7552 Bursitis of left shoulder: Secondary | ICD-10-CM

## 2014-02-26 DIAGNOSIS — M25519 Pain in unspecified shoulder: Secondary | ICD-10-CM

## 2014-02-26 DIAGNOSIS — M6281 Muscle weakness (generalized): Secondary | ICD-10-CM

## 2014-02-26 DIAGNOSIS — M9901 Segmental and somatic dysfunction of cervical region: Secondary | ICD-10-CM

## 2014-03-04 ENCOUNTER — Encounter (INDEPENDENT_AMBULATORY_CARE_PROVIDER_SITE_OTHER): Payer: BC Managed Care – PPO | Admitting: Physical Therapy

## 2014-03-04 DIAGNOSIS — M9901 Segmental and somatic dysfunction of cervical region: Secondary | ICD-10-CM

## 2014-03-04 DIAGNOSIS — M25519 Pain in unspecified shoulder: Secondary | ICD-10-CM

## 2014-03-04 DIAGNOSIS — M7552 Bursitis of left shoulder: Secondary | ICD-10-CM

## 2014-03-04 DIAGNOSIS — M6281 Muscle weakness (generalized): Secondary | ICD-10-CM

## 2014-03-11 ENCOUNTER — Encounter (INDEPENDENT_AMBULATORY_CARE_PROVIDER_SITE_OTHER): Payer: BLUE CROSS/BLUE SHIELD | Admitting: Physical Therapy

## 2014-03-11 DIAGNOSIS — M7552 Bursitis of left shoulder: Secondary | ICD-10-CM

## 2014-03-11 DIAGNOSIS — M25519 Pain in unspecified shoulder: Secondary | ICD-10-CM

## 2014-03-11 DIAGNOSIS — M6281 Muscle weakness (generalized): Secondary | ICD-10-CM

## 2014-03-11 DIAGNOSIS — M9901 Segmental and somatic dysfunction of cervical region: Secondary | ICD-10-CM

## 2014-03-18 ENCOUNTER — Encounter (INDEPENDENT_AMBULATORY_CARE_PROVIDER_SITE_OTHER): Payer: BLUE CROSS/BLUE SHIELD | Admitting: Physical Therapy

## 2014-03-18 DIAGNOSIS — M25519 Pain in unspecified shoulder: Secondary | ICD-10-CM

## 2014-03-18 DIAGNOSIS — M7552 Bursitis of left shoulder: Secondary | ICD-10-CM

## 2014-03-18 DIAGNOSIS — M6281 Muscle weakness (generalized): Secondary | ICD-10-CM

## 2014-03-18 DIAGNOSIS — M9901 Segmental and somatic dysfunction of cervical region: Secondary | ICD-10-CM

## 2014-04-03 ENCOUNTER — Ambulatory Visit (INDEPENDENT_AMBULATORY_CARE_PROVIDER_SITE_OTHER): Payer: BLUE CROSS/BLUE SHIELD | Admitting: Family Medicine

## 2014-04-03 ENCOUNTER — Encounter: Payer: Self-pay | Admitting: Family Medicine

## 2014-04-03 VITALS — BP 122/84 | HR 77 | Ht 64.0 in | Wt 174.0 lb

## 2014-04-03 DIAGNOSIS — M999 Biomechanical lesion, unspecified: Secondary | ICD-10-CM

## 2014-04-03 DIAGNOSIS — M9901 Segmental and somatic dysfunction of cervical region: Secondary | ICD-10-CM

## 2014-04-03 DIAGNOSIS — M9908 Segmental and somatic dysfunction of rib cage: Secondary | ICD-10-CM

## 2014-04-03 DIAGNOSIS — M9902 Segmental and somatic dysfunction of thoracic region: Secondary | ICD-10-CM

## 2014-04-03 DIAGNOSIS — M542 Cervicalgia: Secondary | ICD-10-CM

## 2014-04-03 DIAGNOSIS — M9903 Segmental and somatic dysfunction of lumbar region: Secondary | ICD-10-CM

## 2014-04-03 NOTE — Progress Notes (Signed)
Pre visit review using our clinic review tool, if applicable. No additional management support is needed unless otherwise documented below in the visit note. 

## 2014-04-03 NOTE — Assessment & Plan Note (Signed)
Patient neck pain bilaterally still seems to be multifactorial mostly being secondary to mild osteoarthritis but mostly muscle imbalances. Encourage patient to continue to work on the postural changes. Patient has changed the ergonomics at work which seems to be making a difference. We discussed avoiding any significant heavy lifting when possible. Otherwise patient will continue the same exercises and the same vitamins. Patient will come back and see me again in 3-4 weeks.

## 2014-04-03 NOTE — Patient Instructions (Signed)
You are doing great  Lower your sugar.  Continue whatever you are dong Avoid significant shoveling.  See me again in 6-7 weeks.

## 2014-04-03 NOTE — Assessment & Plan Note (Signed)
Decision today to treat with OMT was based on Physical Exam  After verbal consent patient was treated with HVLa and ME techniques in cervical, thoracic,rib   lumbar and sacrum areas, discussed other exercises as well.   Patient tolerated the procedure well with improvement in symptoms  Patient given exercises, stretches and lifestyle modifications  See medications in patient instructions if given  Patient will follow up in 6-7 weeks

## 2014-04-03 NOTE — Progress Notes (Signed)
  CC: Patient is a 49 year old female followup for neck pain  HPI: Patient was seen previously for neck pain. Patient did have osteopathic manipulation states she continues to have some moderate improvement. Patient states overall she has been feeling relatively well. Patient has been removing wallpaper as well as shoveling snow which does cause some increasing discomfort in the upper neck. Patient states though that no radiation down the arms or any numbness or tingling. Overall patient feels relatively well.     .  Past medical, surgical, family and social history reviewed. Medications reviewed all in the electronic medical record.   Review of Systems: No headache, visual changes, nausea, vomiting, diarrhea, constipation, dizziness, abdominal pain, skin rash, fevers, chills, night sweats, weight loss, swollen lymph nodes, body aches, joint swelling, muscle aches, chest pain, shortness of breath, mood changes.   Objective:    Blood pressure 122/84, pulse 77, weight 174 lb (78.926 kg), SpO2 98 %.   General: No apparent distress alert and oriented x3 mood and affect normal, dressed appropriately.  HEENT: Pupils equal, extraocular movements intact Respiratory: Patient's speak in full sentences and does not appear short of breath Cardiovascular: No lower extremity edema, non tender, no erythema Skin: Warm dry intact with no signs of infection or rash on extremities or on axial skeleton. Abdomen: Soft nontender Neuro: Cranial nerves II through XII are intact, neurovascularly intact in all extremities with 2+ DTRs and 2+ pulses. Lymph: No lymphadenopathy of posterior or anterior cervical chain or axillae bilaterally.  Gait normal with good balance and coordination.  MSK: Non tender with full range of motion and good stability and symmetric strength and tone of , elbows, wrist, hip, knee and ankles bilaterally.  Neck: Inspection unremarkable. No palpable stepoffs. Negative Spurling's  maneuver. Full neck range of motion Grip strength and sensation normal in bilateral hands Strength good C4 to T1 distribution No sensory change to C4 to T1 Negative Hoffman sign bilaterally Reflexes normal No change from previous exam Shoulder: left Inspection reveals no abnormalities, atrophy or asymmetry. Palpation is normal with no tenderness over AC joint or bicipital groove. ROM is full in all planes passively. Rotator cuff strength normal throughout. signs of impingement with positive Neer and Hawkin's tests, but negative empty can sign. Continues to be doing fairly well Speeds and Yergason's tests normal. No labral pathology noted with negative Obrien's, negative clunk and good stability. Normal scapular function observed. No painful arc and no drop arm sign. No apprehension sign Contralateral shoulder unremarkable Continues to be patient's baseline    OMT Physical Exam  Cervical  C2 flexed rotated and side bent left C4 flexed rotated and side bent right   Thoracic T2 extended rotated and side bent left with inhaled second rib T5 extended rotated and side bent right  Lumbar L2 flexed rotated inside that right  Sacrum Left on left  Illium Neutral  Impression and Recommendations:     This case required medical decision making of moderate complexity.

## 2014-04-14 ENCOUNTER — Other Ambulatory Visit: Payer: Self-pay | Admitting: Family Medicine

## 2014-04-14 NOTE — Telephone Encounter (Signed)
Refill done.  

## 2014-05-15 ENCOUNTER — Ambulatory Visit: Payer: Self-pay | Admitting: Family Medicine

## 2014-05-21 ENCOUNTER — Ambulatory Visit (INDEPENDENT_AMBULATORY_CARE_PROVIDER_SITE_OTHER): Payer: BLUE CROSS/BLUE SHIELD | Admitting: Family Medicine

## 2014-05-21 ENCOUNTER — Encounter: Payer: Self-pay | Admitting: Family Medicine

## 2014-05-21 VITALS — BP 126/82 | HR 77 | Ht 64.0 in | Wt 169.0 lb

## 2014-05-21 DIAGNOSIS — M9902 Segmental and somatic dysfunction of thoracic region: Secondary | ICD-10-CM

## 2014-05-21 DIAGNOSIS — M9908 Segmental and somatic dysfunction of rib cage: Secondary | ICD-10-CM

## 2014-05-21 DIAGNOSIS — M9901 Segmental and somatic dysfunction of cervical region: Secondary | ICD-10-CM

## 2014-05-21 DIAGNOSIS — M9903 Segmental and somatic dysfunction of lumbar region: Secondary | ICD-10-CM

## 2014-05-21 DIAGNOSIS — M542 Cervicalgia: Secondary | ICD-10-CM

## 2014-05-21 DIAGNOSIS — M999 Biomechanical lesion, unspecified: Secondary | ICD-10-CM

## 2014-05-21 NOTE — Progress Notes (Signed)
Pre visit review using our clinic review tool, if applicable. No additional management support is needed unless otherwise documented below in the visit note. 

## 2014-05-21 NOTE — Patient Instructions (Signed)
Good to see you You are doing very well overall Keep doing what you are doing.  Ice is your friend when needed Lets continue every 6-8 weeks.

## 2014-05-21 NOTE — Assessment & Plan Note (Signed)
Discussed HEP, icing and posture changes.  RTC in 4-6 weeks as needed.

## 2014-05-21 NOTE — Assessment & Plan Note (Signed)
Decision today to treat with OMT was based on Physical Exam  After verbal consent patient was treated with HVLa and ME techniques in cervical, thoracic,rib   lumbar and sacrum areas, discussed other exercises as well.   Patient tolerated the procedure well with improvement in symptoms  Patient given exercises, stretches and lifestyle modifications  See medications in patient instructions if given  Patient will follow up in 6-8 weeks                        

## 2014-05-21 NOTE — Progress Notes (Signed)
  CC: Patient is a 49 year old female followup for neck pain  HPI: Patient was seen previously for neck pain. Patient did have osteopathic manipulation states she continues to have some moderate improvement. Patient states overall she has been feeling relatively well. Patient has been removing wallpaper as well as shoveling snow which does cause some increasing discomfort in the upper neck. Patient states though that no radiation down the arms or any numbness or tingling. Overall patient feels relatively well. Patient has been traveling a lot and has not notice any significant discomfort. Patient has been doing better.     .  Past medical, surgical, family and social history reviewed. Medications reviewed all in the electronic medical record.   Review of Systems: No headache, visual changes, nausea, vomiting, diarrhea, constipation, dizziness, abdominal pain, skin rash, fevers, chills, night sweats, weight loss, swollen lymph nodes, body aches, joint swelling, muscle aches, chest pain, shortness of breath, mood changes.   Objective:    Blood pressure 126/82, pulse 77, height 5\' 4"  (1.626 m), weight 169 lb (76.658 kg), SpO2 99 %.   General: No apparent distress alert and oriented x3 mood and affect normal, dressed appropriately.  HEENT: Pupils equal, extraocular movements intact Respiratory: Patient's speak in full sentences and does not appear short of breath Cardiovascular: No lower extremity edema, non tender, no erythema Skin: Warm dry intact with no signs of infection or rash on extremities or on axial skeleton. Abdomen: Soft nontender Neuro: Cranial nerves II through XII are intact, neurovascularly intact in all extremities with 2+ DTRs and 2+ pulses. Lymph: No lymphadenopathy of posterior or anterior cervical chain or axillae bilaterally.  Gait normal with good balance and coordination.  MSK: Non tender with full range of motion and good stability and symmetric strength and tone of ,  elbows, wrist, hip, knee and ankles bilaterally.  Neck: Inspection unremarkable. No palpable stepoffs. Negative Spurling's maneuver. Full neck range of motion Grip strength and sensation normal in bilateral hands Strength good C4 to T1 distribution No sensory change to C4 to T1 Negative Hoffman sign bilaterally Reflexes normal No change from previous exam    OMT Physical Exam  Cervical  C2 flexed rotated and side bent left C4 flexed rotated and side bent right   Thoracic T2 extended rotated and side bent left with inhaled second rib T5 extended rotated and side bent right  Lumbar L2 flexed rotated inside that right  Sacrum Left on left  Illium Neutral  Impression and Recommendations:     This case required medical decision making of moderate complexity.

## 2014-07-11 ENCOUNTER — Ambulatory Visit (INDEPENDENT_AMBULATORY_CARE_PROVIDER_SITE_OTHER): Payer: BLUE CROSS/BLUE SHIELD | Admitting: Family Medicine

## 2014-07-11 ENCOUNTER — Encounter: Payer: Self-pay | Admitting: Family Medicine

## 2014-07-11 VITALS — BP 122/84 | HR 75 | Ht 64.0 in | Wt 167.0 lb

## 2014-07-11 DIAGNOSIS — M9903 Segmental and somatic dysfunction of lumbar region: Secondary | ICD-10-CM

## 2014-07-11 DIAGNOSIS — R42 Dizziness and giddiness: Secondary | ICD-10-CM | POA: Diagnosis not present

## 2014-07-11 DIAGNOSIS — M999 Biomechanical lesion, unspecified: Secondary | ICD-10-CM

## 2014-07-11 DIAGNOSIS — M9908 Segmental and somatic dysfunction of rib cage: Secondary | ICD-10-CM

## 2014-07-11 DIAGNOSIS — M9901 Segmental and somatic dysfunction of cervical region: Secondary | ICD-10-CM | POA: Diagnosis not present

## 2014-07-11 DIAGNOSIS — M542 Cervicalgia: Secondary | ICD-10-CM | POA: Diagnosis not present

## 2014-07-11 NOTE — Progress Notes (Signed)
  CC: Patient is a 49 year old female followup for neck pain  HPI: Patient was seen previously for neck pain. Patient did have osteopathic manipulation states she continues to have some moderate improvement. Patient has been seen me fairly regularly. Patient continues to have some of the same aches and pains. Patient has been traveling recently and has noticed some mild increasing upper back as well as left shoulder pain. Still giving her a dull throbbing sensation. Patient did notice that this seemed to get worse after moving furniture. Patient is also not doing the exercises regularly. 2 episodes of vertigo but does not know if this is associated with allergies or with neck pain.   .  Past medical, surgical, family and social history reviewed. Medications reviewed all in the electronic medical record.   Review of Systems: No headache, visual changes, nausea, vomiting, diarrhea, constipation, dizziness, abdominal pain, skin rash, fevers, chills, night sweats, weight loss, swollen lymph nodes, body aches, joint swelling, muscle aches, chest pain, shortness of breath, mood changes.   Objective:    Blood pressure 122/84, pulse 75, height 5\' 4"  (1.626 m), weight 167 lb (75.751 kg), SpO2 98 %.   General: No apparent distress alert and oriented x3 mood and affect normal, dressed appropriately.  HEENT: Pupils equal, extraocular movements intact Respiratory: Patient's speak in full sentences and does not appear short of breath Cardiovascular: No lower extremity edema, non tender, no erythema Skin: Warm dry intact with no signs of infection or rash on extremities or on axial skeleton. Abdomen: Soft nontender Neuro: Cranial nerves II through XII are intact, neurovascularly intact in all extremities with 2+ DTRs and 2+ pulses. Lymph: No lymphadenopathy of posterior or anterior cervical chain or axillae bilaterally.  Gait normal with good balance and coordination.  MSK: Non tender with full range of  motion and good stability and symmetric strength and tone of , elbows, wrist, hip, knee and ankles bilaterally.  Neck: Inspection unremarkable. No palpable stepoffs. Negative Spurling's maneuver. Full neck range of motion patient though is more stiff than usual Grip strength and sensation normal in bilateral hands Strength good C4 to T1 distribution No sensory change to C4 to T1 Negative Hoffman sign bilaterally Reflexes normal No change from previous exam    OMT Physical Exam  Cervical  C2 flexed rotated and side bent left C4 flexed rotated and side bent right   Thoracic T2 extended rotated and side bent left with inhaled second rib T5 extended rotated and side bent right  Lumbar L2 flexed rotated inside that right  Sacrum Left on left  Illium Neutral  Impression and Recommendations:     This case required medical decision making of moderate complexity.

## 2014-07-11 NOTE — Assessment & Plan Note (Signed)
One episode while traveling. Patient has had it is in the past. Patient will startfor the next 10 days. If no significant improvement further workup is necessary. Does have meclizine.

## 2014-07-11 NOTE — Assessment & Plan Note (Signed)
Decision today to treat with OMT was based on Physical Exam  After verbal consent patient was treated with HVLa and ME techniques in cervical, thoracic,rib   lumbar and sacrum areas, discussed other exercises as well.   Patient tolerated the procedure well with improvement in symptoms  Patient given exercises, stretches and lifestyle modifications  See medications in patient instructions if given  Patient will follow up in 6-8 weeks

## 2014-07-11 NOTE — Assessment & Plan Note (Signed)
Patient overall has changed most of her not ergonomics at work as well as been doing some home exercises. Patient continues to try to be fairly active. Patient has had some difficulty with continuing to do the exercises on a regular basis. Patient does have muscle relaxers for breakthrough pain and continues on natural supplementations. Patient will continue to remain active and will see me again in 6-8 weeks for further evaluation and treatment.

## 2014-07-11 NOTE — Patient Instructions (Addendum)
Good to see you Lets focus a little more on posture and strengthening the upper back.  Continue to work on the scapula strength as well and try to incorporate some of the following exercises You are doing great and lets continue every 6-8 weeks.     Standing:  Secure a rubber exercise band/tubing so that it is at the height of your shoulders when you are either standing or sitting on a firm arm-less chair.  Grasp an end of the band/tubing in each hand and have your palms face each other. Straighten your elbows and lift your hands straight in front of you at shoulder height. Step back away from the secured end of band/tubing until it becomes tense.  Squeeze your shoulder blades together. Keeping your elbows locked and your hands at shoulder-height, bring your hands out to your side.  Hold __________ seconds. Slowly ease the tension on the band/tubing as you reverse the directions and return to the starting position. Repeat __________ times. Complete this exercise __________ times per day. STRENGTH - Scapular Retractors  Secure a rubber exercise band/tubing so that it is at the height of your shoulders when you are either standing or sitting on a firm arm-less chair.  With a palm-down grip, grasp an end of the band/tubing in each hand. Straighten your elbows and lift your hands straight in front of you at shoulder height. Step back away from the secured end of band/tubing until it becomes tense.  Squeezing your shoulder blades together, draw your elbows back as you bend them. Keep your upper arm lifted away from your body throughout the exercise.  Hold __________ seconds. Slowly ease the tension on the band/tubing as you reverse the directions and return to the starting position. Repeat __________ times. Complete this exercise __________ times per day. STRENGTH - Shoulder Extensors   Secure a rubber exercise band/tubing so that it is at the height of your shoulders when you are either  standing or sitting on a firm arm-less chair.  With a thumbs-up grip, grasp an end of the band/tubing in each hand. Straighten your elbows and lift your hands straight in front of you at shoulder height. Step back away from the secured end of band/tubing until it becomes tense.  Squeezing your shoulder blades together, pull your hands down to the sides of your thighs. Do not allow your hands to go behind you.  Hold for __________ seconds. Slowly ease the tension on the band/tubing as you reverse the directions and return to the starting position. Repeat __________ times. Complete this exercise __________ times per day.  STRENGTH - Scapular Retractors and External Rotators  Secure a rubber exercise band/tubing so that it is at the height of your shoulders when you are either standing or sitting on a firm arm-less chair.  With a palm-down grip, grasp an end of the band/tubing in each hand. Bend your elbows 90 degrees and lift your elbows to shoulder height at your sides. Step back away from the secured end of band/tubing until it becomes tense.  Squeezing your shoulder blades together, rotate your shoulder so that your upper arm and elbow remain stationary, but your fists travel upward to head-height.  Hold __________ for seconds. Slowly ease the tension on the band/tubing as you reverse the directions and return to the starting position. Repeat __________ times. Complete this exercise __________ times per day.  STRENGTH - Scapular Retractors and External Rotators, Rowing  Secure a rubber exercise band/tubing so that it is at the  height of your shoulders when you are either standing or sitting on a firm arm-less chair.  With a palm-down grip, grasp an end of the band/tubing in each hand. Straighten your elbows and lift your hands straight in front of you at shoulder height. Step back away from the secured end of band/tubing until it becomes tense.  Step 1: Squeeze your shoulder blades together.  Bending your elbows, draw your hands to your chest as if you are rowing a boat. At the end of this motion, your hands and elbow should be at shoulder-height and your elbows should be out to your sides.  Step 2: Rotate your shoulder to raise your hands above your head. Your forearms should be vertical and your upper-arms should be horizontal.  Hold for __________ seconds. Slowly ease the tension on the band/tubing as you reverse the directions and return to the starting position. Repeat __________ times. Complete this exercise __________ times per day.  STRENGTH - Scapular Retractors and Elevators  Secure a rubber exercise band/tubing so that it is at the height of your shoulders when you are either standing or sitting on a firm arm-less chair.  With a thumbs-up grip, grasp an end of the band/tubing in each hand. Step back away from the secured end of band/tubing until it becomes tense.  Squeezing your shoulder blades together, straighten your elbows and lift your hands straight over your head.  Hold for __________ seconds. Slowly ease the tension on the band/tubing as you reverse the directions and return to the starting position. Repeat __________ times. Complete this exercise __________ times per day.  Document Released: 02/21/2005 Document Revised: 05/16/2011 Document Reviewed: 06/05/2008 [facility] Patient Information 2015 [employer], [place]. This information is not intended to replace advice given to you by your health care provider. Make sure you discuss any questions you have with your health care provider.

## 2014-07-11 NOTE — Progress Notes (Signed)
Pre visit review using our clinic review tool, if applicable. No additional management support is needed unless otherwise documented below in the visit note. 

## 2014-08-22 ENCOUNTER — Encounter: Payer: Self-pay | Admitting: Family Medicine

## 2014-08-22 ENCOUNTER — Ambulatory Visit (INDEPENDENT_AMBULATORY_CARE_PROVIDER_SITE_OTHER): Payer: BLUE CROSS/BLUE SHIELD | Admitting: Family Medicine

## 2014-08-22 VITALS — BP 118/72 | HR 90 | Ht 64.0 in | Wt 169.0 lb

## 2014-08-22 DIAGNOSIS — M999 Biomechanical lesion, unspecified: Secondary | ICD-10-CM

## 2014-08-22 DIAGNOSIS — M9901 Segmental and somatic dysfunction of cervical region: Secondary | ICD-10-CM | POA: Diagnosis not present

## 2014-08-22 DIAGNOSIS — M9902 Segmental and somatic dysfunction of thoracic region: Secondary | ICD-10-CM | POA: Diagnosis not present

## 2014-08-22 DIAGNOSIS — M542 Cervicalgia: Secondary | ICD-10-CM | POA: Diagnosis not present

## 2014-08-22 DIAGNOSIS — M9903 Segmental and somatic dysfunction of lumbar region: Secondary | ICD-10-CM

## 2014-08-22 DIAGNOSIS — M9908 Segmental and somatic dysfunction of rib cage: Secondary | ICD-10-CM | POA: Diagnosis not present

## 2014-08-22 NOTE — Progress Notes (Signed)
Pre visit review using our clinic review tool, if applicable. No additional management support is needed unless otherwise documented below in the visit note. 

## 2014-08-22 NOTE — Assessment & Plan Note (Signed)
Decision today to treat with OMT was based on Physical Exam  After verbal consent patient was treated with HVLa and ME techniques in cervical, thoracic,rib   lumbar and sacrum areas, discussed other exercises as well.   Patient tolerated the procedure well with improvement in symptoms  Patient given exercises, stretches and lifestyle modifications  See medications in patient instructions if given  Patient will follow up in 3-4 weeks due to patient doing a lot more moving of furniture in the next several weeks and months

## 2014-08-22 NOTE — Patient Instructions (Signed)
Good to see you Possibly look at black cohash and valerian root Watch diet Good luck with moving.  Ice is definitely your best friend for next many weeks See me again in 3 weeks.

## 2014-08-22 NOTE — Assessment & Plan Note (Signed)
Systemic pain is multifactorial secondary distress as well as the poor posture. Patient has not been doing the exercises as frequently as she should. Exercises. Patient will continue her other regimens at this time. Patient will come back and see me again in 3-4 weeks for further evaluation and treatment.

## 2014-08-22 NOTE — Progress Notes (Signed)
  CC: Patient is a 49 year old female followup for neck pain  HPI: Patient was seen previously for neck pain. Patient did have osteopathic manipulation states she continues to have some moderate improvement. Patient though has been moving out of one of her houses which has been very difficult. Patient states that she has been doing most of the manual labor herself. Patient states that when she does that unfortunately she has worsening of the upper back pain. States that that this causes the neck pain as well. Patient has had many leg changes recently and is noticing a little increasing stress as well. Denies any numbness or tingling in the extremities. 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   .  Past medical, surgical, family and social history reviewed. Medications reviewed all in the electronic medical record.   Review of Systems: No headache, visual changes, nausea, vomiting, diarrhea, constipation, dizziness, abdominal pain, skin rash, fevers, chills, night sweats, weight loss, swollen lymph nodes, body aches, joint swelling, muscle aches, chest pain, shortness of breath, mood changes.   Objective:    Blood pressure 118/72, pulse 90, height 5\' 4"  (1.626 m), weight 169 lb (76.658 kg), SpO2 98 %.   General: No apparent distress alert and oriented x3 mood and affect normal, dressed appropriately.  HEENT: Pupils equal, extraocular movements intact Respiratory: Patient's speak in full sentences and does not appear short of breath Cardiovascular: No lower extremity edema, non tender, no erythema Skin: Warm dry intact with no signs of infection or rash on extremities or on axial skeleton. Abdomen: Soft nontender Neuro: Cranial nerves II through XII are intact, neurovascularly intact in all extremities with 2+ DTRs and 2+ pulses. Lymph: No lymphadenopathy of posterior or anterior cervical chain or  axillae bilaterally.  Gait normal with good balance and coordination.  MSK: Non tender with full range of motion and good stability and symmetric strength and tone of , elbows, wrist, hip, knee and ankles bilaterally.  Neck: Inspection unremarkable. No palpable stepoffs. Negative Spurling's maneuver. Full neck range of motion  Grip strength and sensation normal in bilateral hands Strength good C4 to T1 distribution No sensory change to C4 to T1 Negative Hoffman sign bilaterally Reflexes normal Mild improvement from previous exam    OMT Physical Exam  Cervical  C2 flexed rotated and side bent left C4 flexed rotated and side bent right   Thoracic T2 extended rotated and side bent left with inhaled second rib T5 extended rotated and side bent right  Lumbar L2 flexed rotated inside that right  Sacrum Left on left   Impression and Recommendations:     This case required medical decision making of moderate complexity.

## 2014-09-10 ENCOUNTER — Encounter: Payer: Self-pay | Admitting: Family Medicine

## 2014-09-10 ENCOUNTER — Ambulatory Visit (INDEPENDENT_AMBULATORY_CARE_PROVIDER_SITE_OTHER): Payer: BLUE CROSS/BLUE SHIELD | Admitting: Family Medicine

## 2014-09-10 VITALS — BP 118/80 | HR 86 | Ht 64.0 in | Wt 170.0 lb

## 2014-09-10 DIAGNOSIS — M9903 Segmental and somatic dysfunction of lumbar region: Secondary | ICD-10-CM

## 2014-09-10 DIAGNOSIS — M9908 Segmental and somatic dysfunction of rib cage: Secondary | ICD-10-CM

## 2014-09-10 DIAGNOSIS — M9902 Segmental and somatic dysfunction of thoracic region: Secondary | ICD-10-CM | POA: Diagnosis not present

## 2014-09-10 DIAGNOSIS — M9901 Segmental and somatic dysfunction of cervical region: Secondary | ICD-10-CM | POA: Diagnosis not present

## 2014-09-10 DIAGNOSIS — M999 Biomechanical lesion, unspecified: Secondary | ICD-10-CM

## 2014-09-10 DIAGNOSIS — M542 Cervicalgia: Secondary | ICD-10-CM

## 2014-09-10 MED ORDER — PROMETHAZINE HCL 25 MG PO TABS: 25.0000 mg | ORAL_TABLET | Freq: Four times a day (QID) | ORAL | Status: AC | PRN

## 2014-09-10 NOTE — Assessment & Plan Note (Signed)
I believe this patient's headaches could be somewhat surgeon in today. We discussed that patient does have history of migraines and patient given a prescription for Phenergan to see if this will be helpful. We discussed the manual massage techniques as well as the postural changes that could be beneficial. Patient does have hydroxyzine if she needs for also relief at night. Patient and will come back and see me again in 3-4 weeks for further evaluation and treatment. She will be traveling to [place] in the interim

## 2014-09-10 NOTE — Patient Instructions (Addendum)
Good top see you always Congrats with the move.  Try the phenergan if you need it for the headache or vomiting.  Continue the exercises See me again in 3-4 weeks.

## 2014-09-10 NOTE — Assessment & Plan Note (Signed)
Decision today to treat with OMT was based on Physical Exam  After verbal consent patient was treated with HVLa and ME techniques in cervical, thoracic,rib   lumbar and sacrum areas, discussed other exercises as well.   Patient tolerated the procedure well with improvement in symptoms  Patient given exercises, stretches and lifestyle modifications  See medications in patient instructions if given  Patient will follow up in 3-4 weeks and will be doing traveling.

## 2014-09-10 NOTE — Progress Notes (Signed)
Pre visit review using our clinic review tool, if applicable. No additional management support is needed unless otherwise documented below in the visit note. 

## 2014-09-10 NOTE — Progress Notes (Signed)
  CC: Patient is a 49 year old female followup for neck pain  HPI: Patient was seen previously for neck pain. Patient did have osteopathic manipulation states she continues to have  A little pain.  Has been doing well overall but has moved out of a cabin recently and had to do a lot more cleaning. Patient states since then she's had some mild discomfort. Patient is having more neck pain as well as headaches. Patient states that her headache medications has not improved significantly. States that the low back pain seems to be somewhat better though.   .  Past medical, surgical, family and social history reviewed. Medications reviewed all in the electronic medical record.   Review of Systems: No headache, visual changes, nausea, vomiting, diarrhea, constipation, dizziness, abdominal pain, skin rash, fevers, chills, night sweats, weight loss, swollen lymph nodes, body aches, joint swelling, muscle aches, chest pain, shortness of breath, mood changes.   Objective:    Blood pressure 118/80, pulse 86, height 5\' 4"  (1.626 m), weight 170 lb (77.111 kg), SpO2 98 %.   General: No apparent distress alert and oriented x3 mood and affect normal, dressed appropriately.  HEENT: Pupils equal, extraocular movements intact Respiratory: Patient's speak in full sentences and does not appear short of breath Cardiovascular: No lower extremity edema, non tender, no erythema Skin: Warm dry intact with no signs of infection or rash on extremities or on axial skeleton. Abdomen: Soft nontender Neuro: Cranial nerves II through XII are intact, neurovascularly intact in all extremities with 2+ DTRs and 2+ pulses. Lymph: No lymphadenopathy of posterior or anterior cervical chain or axillae bilaterally.  Gait normal with good balance and coordination.  MSK: Non tender with full range of motion and good stability and symmetric strength and tone of , elbows, wrist, hip, knee and ankles bilaterally.  Neck: Inspection  unremarkable. No palpable stepoffs. Negative Spurling's maneuver. Moran tightness than previously with mild limitation in range of motion especially with side bending. Grip strength and sensation normal in bilateral hands Strength good C4 to T1 distribution No sensory change to C4 to T1 Negative Hoffman sign bilaterally Reflexes normal    OMT Physical Exam  Cervical  C2 flexed rotated and side bent left C4 flexed rotated and side bent right C7 flexed rotated and side bent left  Thoracic T2 extended rotated and side bent left with inhaled second rib T5 extended rotated and side bent right  Lumbar L2 flexed rotated inside that right  Sacrum Left on left   Impression and Recommendations:     This case required medical decision making of moderate complexity.

## 2014-10-07 ENCOUNTER — Ambulatory Visit (INDEPENDENT_AMBULATORY_CARE_PROVIDER_SITE_OTHER): Payer: BLUE CROSS/BLUE SHIELD | Admitting: Family Medicine

## 2014-10-07 ENCOUNTER — Encounter: Payer: Self-pay | Admitting: Family Medicine

## 2014-10-07 VITALS — BP 122/80 | HR 75 | Ht 64.0 in | Wt 172.0 lb

## 2014-10-07 DIAGNOSIS — M9908 Segmental and somatic dysfunction of rib cage: Secondary | ICD-10-CM | POA: Diagnosis not present

## 2014-10-07 DIAGNOSIS — M9901 Segmental and somatic dysfunction of cervical region: Secondary | ICD-10-CM | POA: Diagnosis not present

## 2014-10-07 DIAGNOSIS — M9903 Segmental and somatic dysfunction of lumbar region: Secondary | ICD-10-CM | POA: Diagnosis not present

## 2014-10-07 DIAGNOSIS — M542 Cervicalgia: Secondary | ICD-10-CM

## 2014-10-07 DIAGNOSIS — M999 Biomechanical lesion, unspecified: Secondary | ICD-10-CM

## 2014-10-07 NOTE — Assessment & Plan Note (Signed)
Decision today to treat with OMT was based on Physical Exam  After verbal consent patient was treated with HVLa and ME techniques in cervical, thoracic,rib   lumbar and sacrum areas, discussed other exercises as well.   Patient tolerated the procedure well with improvement in symptoms  Patient given exercises, stretches and lifestyle modifications  See medications in patient instructions if given  Patient will follow up in 4 weeks                

## 2014-10-07 NOTE — Progress Notes (Signed)
  CC: Patient is a 49 year old female followup for neck pain  HPI: Patient was seen previously for neck pain. Patient did have osteopathic manipulation states she continues to have patient has been having a lot of stress recently. 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 Patient also has her mother potentially moving is also very difficult. Patient is having difficulty with all these changes and once. States that this is causing more tightness of the upper back as well as the lower back with [place] any more time in a car.  .  Past medical, surgical, family and social history reviewed. Medications reviewed all in the electronic medical record.   Review of Systems: No headache, visual changes, nausea, vomiting, diarrhea, constipation, dizziness, abdominal pain, skin rash, fevers, chills, night sweats, weight loss, swollen lymph nodes, body aches, joint swelling, muscle aches, chest pain, shortness of breath, mood changes.   Objective:    Blood pressure 122/80, pulse 75, height  (1.626 m), weight 172 lb (78.019 kg), SpO2 97 %.   General: No apparent distress alert and oriented x3 mood and affect normal, dressed appropriately.  HEENT: Pupils equal, extraocular movements intact Respiratory: Patient's speak in full sentences and does not appear short of breath Cardiovascular: No lower extremity edema, non tender, no erythema Skin: Warm dry intact with no signs of infection or rash on extremities or on axial skeleton. Abdomen: Soft nontender Neuro: Cranial nerves II through XII are intact, neurovascularly intact in all extremities with 2+ DTRs and 2+ pulses. Lymph: No lymphadenopathy of posterior or anterior cervical chain or axillae bilaterally.  Gait normal with good balance and coordination.  MSK: Non tender with full range of motion and good stability and symmetric strength and tone of , elbows, wrist, hip, knee and ankles bilaterally.  Neck: Inspection unremarkable. No palpable  stepoffs. Negative Spurling's maneuver. Continued tenderness of the paraspinal musculature of the cervical spine Grip strength and sensation normal in bilateral hands Strength good C4 to T1 distribution No sensory change to C4 to T1 Negative Hoffman sign bilaterally Reflexes normal    OMT Physical Exam  Cervical  C2 flexed rotated and side bent left  C7 flexed rotated and side bent left  Thoracic T2 extended rotated and side bent left with inhaled second rib T5 extended rotated and side bent right  Lumbar L2 flexed rotated inside that right L4 flexed rotated and side bent left  Sacrum Left on left   Impression and Recommendations:     This case required medical decision making of moderate complexity.

## 2014-10-07 NOTE — Assessment & Plan Note (Signed)
Continues to be living to some stressors the patient is having. Encourage patient to take care of herself as well as start working on a regular basis. Patient is not doing the home exercises as much as she was doing previously. Patient does have hydroxyzine for any breakthrough discomfort that could be beneficial. We discussed icing regimen as well. Patient will continue the over-the-counter natural supplementations and patient will come back and see me again in 4 weeks for further evaluation.

## 2014-10-07 NOTE — Patient Instructions (Addendum)
Good to see you Good luck with your mom and the cabin Count to 10 when feeling stress Continue to focus on yourself.  See me again in 4 weeks.

## 2014-10-07 NOTE — Progress Notes (Signed)
Pre visit review using our clinic review tool, if applicable. No additional management support is needed unless otherwise documented below in the visit note. 

## 2014-10-08 ENCOUNTER — Ambulatory Visit: Payer: Self-pay | Admitting: Family Medicine

## 2014-10-28 ENCOUNTER — Encounter: Payer: Self-pay | Admitting: Family Medicine

## 2014-10-28 ENCOUNTER — Ambulatory Visit (INDEPENDENT_AMBULATORY_CARE_PROVIDER_SITE_OTHER): Payer: BLUE CROSS/BLUE SHIELD | Admitting: Family Medicine

## 2014-10-28 VITALS — BP 124/80 | HR 69 | Ht 64.0 in | Wt 170.0 lb

## 2014-10-28 DIAGNOSIS — M9903 Segmental and somatic dysfunction of lumbar region: Secondary | ICD-10-CM | POA: Diagnosis not present

## 2014-10-28 DIAGNOSIS — M542 Cervicalgia: Secondary | ICD-10-CM | POA: Diagnosis not present

## 2014-10-28 DIAGNOSIS — M9901 Segmental and somatic dysfunction of cervical region: Secondary | ICD-10-CM | POA: Diagnosis not present

## 2014-10-28 DIAGNOSIS — M7601 Gluteal tendinitis, right hip: Secondary | ICD-10-CM

## 2014-10-28 DIAGNOSIS — M9908 Segmental and somatic dysfunction of rib cage: Secondary | ICD-10-CM

## 2014-10-28 DIAGNOSIS — M999 Biomechanical lesion, unspecified: Secondary | ICD-10-CM

## 2014-10-28 NOTE — Progress Notes (Signed)
Pre visit review using our clinic review tool, if applicable. No additional management support is needed unless otherwise documented below in the visit note. 

## 2014-10-28 NOTE — Patient Instructions (Signed)
Good to see you Glute tendon exercises 3 times a week for possible subluxation Ibuprofen  3 times a day for another 2-3 days Muscle relaxer at night for another 3 nights See me again in 3 weeks.

## 2014-10-28 NOTE — Assessment & Plan Note (Signed)
Improving after 48 hours from injury, ? Subluxation Discussed icing, HEP, Motrin and what to avoid.  Handout given  RTC in 3 weeks or earlier if worsening no sign of LBP or radicular symptoms.

## 2014-10-28 NOTE — Assessment & Plan Note (Signed)
multifactorail does respond to OMT duscussed posture as well.  Discussed proper lifting techniques and icing regimen. Discussed to monitor food and consider break from OTC meds as well RTC in 3 weeks.

## 2014-10-28 NOTE — Progress Notes (Signed)
  CC: Patient is a 49 year old female followup for neck pain  HPI: Patient was seen previously for neck pain. Patient did have osteopathic manipulation states she continues to have patient has been having a lot of stress recently. Been having headaches as well and thinks it is from hormones, working with another provider, has responded well to OMT. Bene using abortive medicine and phenergen as needed.    New problem right hip pain after sitting, significant pain on lateral aspect with audible pop, unable to ambulate well yesterday, 65% better today.  No radiaton worse with increasing pressure/.  No weakness but doe not feel right.  Did wake her up.  Has responded to ice and NSAIDs. Dull ache now in character.        Past medical, surgical, family and social history reviewed. Medications reviewed all in the electronic medical record.   Review of Systems: No headache, visual changes, nausea, vomiting, diarrhea, constipation, dizziness, abdominal pain, skin rash, fevers, chills, night sweats, weight loss, swollen lymph nodes, body aches, joint swelling, muscle aches, chest pain, shortness of breath, mood changes.   Objective:    Blood pressure 124/80, pulse 69, height  (1.626 m), weight 170 lb (77.111 kg), SpO2 97 %.   General: No apparent distress alert and oriented x3 mood and affect normal, dressed appropriately.  HEENT: Pupils equal, extraocular movements intact Respiratory: Patient's speak in full sentences and does not appear short of breath Cardiovascular: No lower extremity edema, non tender, no erythema Skin: Warm dry intact with no signs of infection or rash on extremities or on axial skeleton. Abdomen: Soft nontender Neuro: Cranial nerves II through XII are intact, neurovascularly intact in all extremities with 2+ DTRs and 2+ pulses. Lymph: No lymphadenopathy of posterior or anterior cervical chain or axillae bilaterally.  Gait normal with good balance and coordination.  MSK:  Non tender with full range of motion and good stability and symmetric strength and tone of , elbows, wrist, hip, knee and ankles bilaterally.  Neck: Inspection unremarkable. No palpable stepoffs. Negative Spurling's maneuver. Continued tenderness of the paraspinal musculature of the cervical spine Grip strength and sensation normal in bilateral hands Strength good C4 to T1 distribution No sensory change to C4 to T1 Negative Hoffman sign bilaterally Reflexes normal  Hip: right  ROM IR: 25 Deg, ER: 45 Deg, Flexion: 120 Deg, Extension: 100 Deg, Abduction: 45 Deg, Adduction: 45 Deg Strength IR: 5/5, ER: 5/5, Flexion: 5/5, Extension: 5/5, Abduction: 4/5, Adduction: 5/5 Pelvic alignment unremarkable to inspection and palpation. Standing hip rotation and gait without trendelenburg sign / unsteadiness. Greater trochanter without tenderness to palpation. No tenderness over piriformis and greater trochanter. Mild positve FABEr Pain at insertion of glut muscle.  No SI joint tenderness and normal minimal SI movement. Negative SLT.   OMT Physical Exam  Cervical  C2 flexed rotated and side bent left C6 flexed rotated and side bent left  T1 E RS left with elevated rib      Impression and Recommendations:     This case required medical decision making of moderate complexity.

## 2014-10-28 NOTE — Assessment & Plan Note (Signed)
Decision today to treat with OMT was based on Physical Exam  After verbal consent patient was treated with HVLa and ME techniques in cervical, thoracic,rib   lumbar and sacrum areas, discussed other exercises as well.   Patient tolerated the procedure well with improvement in symptoms  Patient given exercises, stretches and lifestyle modifications  See medications in patient instructions if given  Patient will follow up in 3-4 weeks                                            

## 2014-11-04 ENCOUNTER — Ambulatory Visit: Payer: Self-pay | Admitting: Family Medicine

## 2014-11-18 ENCOUNTER — Ambulatory Visit (INDEPENDENT_AMBULATORY_CARE_PROVIDER_SITE_OTHER): Payer: BLUE CROSS/BLUE SHIELD | Admitting: Family Medicine

## 2014-11-18 ENCOUNTER — Encounter: Payer: Self-pay | Admitting: Family Medicine

## 2014-11-18 VITALS — BP 122/80 | HR 68 | Wt 171.0 lb

## 2014-11-18 DIAGNOSIS — M9908 Segmental and somatic dysfunction of rib cage: Secondary | ICD-10-CM | POA: Diagnosis not present

## 2014-11-18 DIAGNOSIS — M999 Biomechanical lesion, unspecified: Secondary | ICD-10-CM

## 2014-11-18 DIAGNOSIS — M7601 Gluteal tendinitis, right hip: Secondary | ICD-10-CM

## 2014-11-18 DIAGNOSIS — M9901 Segmental and somatic dysfunction of cervical region: Secondary | ICD-10-CM | POA: Diagnosis not present

## 2014-11-18 DIAGNOSIS — M542 Cervicalgia: Secondary | ICD-10-CM | POA: Diagnosis not present

## 2014-11-18 DIAGNOSIS — M9903 Segmental and somatic dysfunction of lumbar region: Secondary | ICD-10-CM | POA: Diagnosis not present

## 2014-11-18 NOTE — Assessment & Plan Note (Signed)
I do believe that some of the stress is also contouring some of the discomfort. We discussed different medication techniques and continue the ergonomics as well as the postural training. Patient continues to respond well to osteopathic manipulation. We discussed icing regimen. Do think we may need to consider different changes of some of her medications in the long run. Patient also has significant bed seasonal allergies a could also be contribute in. We discussed continuing the exercises regularly and patient will come back and see me again in 3-6 weeks for further evaluation and treatment.

## 2014-11-18 NOTE — Assessment & Plan Note (Signed)
Decision today to treat with OMT was based on Physical Exam  After verbal consent patient was treated with HVLa and ME techniques in cervical, thoracic,rib   lumbar and sacrum areas, discussed other exercises as well.   Patient tolerated the procedure well with improvement in symptoms  Patient given exercises, stretches and lifestyle modifications  See medications in patient instructions if given  Patient will follow up in 3-6 weeks                                        

## 2014-11-18 NOTE — Assessment & Plan Note (Signed)
Nearly resolved at this time.  

## 2014-11-18 NOTE — Patient Instructions (Signed)
Good to see you Water your pillow Consider home traction for the neck.  Ice at the end of a long day.  Continue the exercises  Try to control what you can  See me again 3-6 weeks.

## 2014-11-18 NOTE — Progress Notes (Signed)
  CC: Patient is a 49 year old female followup for neck pain  HPI: Patient was seen previously for neck pain. Patient did have osteopathic manipulation states she continues to have patient has been having a lot of stress recently. Patient was having headaches more frequently. Patient states she continues to have headaches about 2 times a month. Patient states she has had some changes by her headache doctor recently. Continues to have difficulty and thinks most of it is secondary to hormones. Has noticed neck tightness when this occurs. Continues to work on [employer] at work.   Patient at last follow-up did have a hip problem. There is a possibility for a very small subluxation that occurred or possibly a snapping hip syndrome. Patient was doing better already but was given some home exercises and icing protocol. Patient states she is approximately 85-90% better. Not having any more pain in the area but still states that it feels little bit uncomfortable with certain movements. Denies any nighttime awakening.       Past medical, surgical, family and social history reviewed. Medications reviewed all in the electronic medical record.   Review of Systems: No headache, visual changes, nausea, vomiting, diarrhea, constipation, dizziness, abdominal pain, skin rash, fevers, chills, night sweats, weight loss, swollen lymph nodes, body aches, joint swelling, muscle aches, chest pain, shortness of breath, mood changes.   Objective:    There were no vitals taken for this visit.   General: No apparent distress alert and oriented x3 mood and affect normal, dressed appropriately.  HEENT: Pupils equal, extraocular movements intact Respiratory: Patient's speak in full sentences and does not appear short of breath Cardiovascular: No lower extremity edema, non tender, no erythema Skin: Warm dry intact with no signs of infection or rash on extremities or on axial skeleton. Abdomen: Soft nontender Neuro:  Cranial nerves II through XII are intact, neurovascularly intact in all extremities with 2+ DTRs and 2+ pulses. Lymph: No lymphadenopathy of posterior or anterior cervical chain or axillae bilaterally.  Gait normal with good balance and coordination.  MSK: Non tender with full range of motion and good stability and symmetric strength and tone of , elbows, wrist, hip, knee and ankles bilaterally.  Neck: Inspection unremarkable. No palpable stepoffs. Negative Spurling's maneuver. Continued tenderness of the paraspinal musculature of the cervical spine Grip strength and sensation normal in bilateral hands Strength good C4 to T1 distribution No sensory change to C4 to T1 Negative Hoffman sign bilaterally Reflexes normal  Hip: right  ROM IR: 25 Deg, ER: 45 Deg, Flexion: 120 Deg, Extension: 100 Deg, Abduction: 45 Deg, Adduction: 45 Deg Strength IR: 5/5, ER: 5/5, Flexion: 5/5, Extension: 5/5, Abduction: 4/5, Adduction: 5/5 Pelvic alignment unremarkable to inspection and palpation. Standing hip rotation and gait without trendelenburg sign / unsteadiness. Greater trochanter without tenderness to palpation. No tenderness over piriformis and greater trochanter. Negative Fabere which is an improvement Less pain over the insertion of the gluteal tendon No SI joint tenderness and normal minimal SI movement. Negative SLT.   OMT Physical Exam  Cervical  C2 flexed rotated and side bent left C6 flexed rotated and side bent left  T1 E RS left with elevated rib T5 extended rotated and side bent right      Impression and Recommendations:     This case required medical decision making of moderate complexity.

## 2014-12-09 ENCOUNTER — Encounter: Payer: Self-pay | Admitting: Family Medicine

## 2014-12-09 ENCOUNTER — Ambulatory Visit (INDEPENDENT_AMBULATORY_CARE_PROVIDER_SITE_OTHER): Payer: BLUE CROSS/BLUE SHIELD | Admitting: Family Medicine

## 2014-12-09 VITALS — BP 124/72 | HR 92 | Ht 64.0 in | Wt 171.0 lb

## 2014-12-09 DIAGNOSIS — M999 Biomechanical lesion, unspecified: Secondary | ICD-10-CM

## 2014-12-09 DIAGNOSIS — M9903 Segmental and somatic dysfunction of lumbar region: Secondary | ICD-10-CM | POA: Diagnosis not present

## 2014-12-09 DIAGNOSIS — M9908 Segmental and somatic dysfunction of rib cage: Secondary | ICD-10-CM | POA: Diagnosis not present

## 2014-12-09 DIAGNOSIS — M9901 Segmental and somatic dysfunction of cervical region: Secondary | ICD-10-CM | POA: Diagnosis not present

## 2014-12-09 DIAGNOSIS — M542 Cervicalgia: Secondary | ICD-10-CM | POA: Diagnosis not present

## 2014-12-09 NOTE — Assessment & Plan Note (Signed)
Decision today to treat with OMT was based on Physical Exam  After verbal consent patient was treated with HVLa and ME techniques in cervical, thoracic,rib   lumbar and sacrum areas, discussed other exercises as well.   Patient tolerated the procedure well with improvement in symptoms  Patient given exercises, stretches and lifestyle modifications  See medications in patient instructions if given  Patient will follow up in 3-6 weeks                                        

## 2014-12-09 NOTE — Progress Notes (Signed)
  CC: Patient is a 49 year old female followup for neck pain  HPI: Patient was seen previously for neck pain. Patient did have osteopathic manipulation states she continues to have patient has been having a lot of stress recently. Patient continues to have her headaches as well. None of the medication seem to be helpful. Patient is not doing the exercises on a regular basis secondary to the amount of stress and potentially moving soon. States that she has been moving large objects as well on a much more regular basis. Notices tightness but does not want to take anti-inflammatory center regular basis.   Past medical, surgical, family and social history reviewed. Medications reviewed all in the electronic medical record.   Review of Systems: No headache, visual changes, nausea, vomiting, diarrhea, constipation, dizziness, abdominal pain, skin rash, fevers, chills, night sweats, weight loss, swollen lymph nodes, body aches, joint swelling, muscle aches, chest pain, shortness of breath, mood changes.   Objective:    Blood pressure 124/72, pulse 92, weight 171 lb (77.565 kg), SpO2 99 %.   General: No apparent distress alert and oriented x3 mood and affect normal, dressed appropriately.  HEENT: Pupils equal, extraocular movements intact Respiratory: Patient's speak in full sentences and does not appear short of breath Cardiovascular: No lower extremity edema, non tender, no erythema Skin: Warm dry intact with no signs of infection or rash on extremities or on axial skeleton. Abdomen: Soft nontender Neuro: Cranial nerves II through XII are intact, neurovascularly intact in all extremities with 2+ DTRs and 2+ pulses. Lymph: No lymphadenopathy of posterior or anterior cervical chain or axillae bilaterally.  Gait normal with good balance and coordination.  MSK: Non tender with full range of motion and good stability and symmetric strength and tone of , elbows, wrist, hip, knee and ankles bilaterally.   Neck: Inspection unremarkable. No palpable stepoffs. Negative Spurling's maneuver. Increased tenderness as well as stiffness of the cervical spine. Grip strength and sensation normal in bilateral hands Strength good C4 to T1 distribution No sensory change to C4 to T1 Negative Hoffman sign bilaterally Reflexes normal  Significant more tightness in the AP spine musculature of the thoracic spine especially on the right side. Mild discomfort of the right sacroiliac joint  OMT Physical Exam  Cervical  C2 flexed rotated and side bent left C6 flexed rotated and side bent left  T1 E RS left with elevated rib T5 extended rotated and side bent right T8 extended rotated and side bent left     Impression and Recommendations:     This case required medical decision making of moderate complexity.

## 2014-12-09 NOTE — Patient Instructions (Addendum)
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  Hydroxyzine at bedtime or flexeril Consider gabapentin  Ice is your friend Joen Laura keep close tabs and say 3 weeks.

## 2014-12-09 NOTE — Assessment & Plan Note (Signed)
Patient continues to have significant stiffness of the neck bilaterally. Patient is having increasing stress. Patient does have hydroxyzine for breakthrough stress. Patient does not want anything stronger and continues Wellbutrin on a regular basis. We'll make no significant changes at this point. Patient will hopefully have less stress when the closing of the house occurs. Patient will come back and see me again in 3-4 weeks.

## 2014-12-09 NOTE — Progress Notes (Signed)
Pre visit review using our clinic review tool, if applicable. No additional management support is needed unless otherwise documented below in the visit note. 

## 2014-12-10 ENCOUNTER — Encounter: Payer: Self-pay | Admitting: Family Medicine

## 2014-12-11 ENCOUNTER — Encounter: Payer: Self-pay | Admitting: Family Medicine

## 2014-12-16 ENCOUNTER — Ambulatory Visit: Payer: Self-pay | Admitting: Family Medicine

## 2014-12-30 ENCOUNTER — Encounter: Payer: Self-pay | Admitting: Family Medicine

## 2014-12-30 ENCOUNTER — Ambulatory Visit (INDEPENDENT_AMBULATORY_CARE_PROVIDER_SITE_OTHER): Payer: BLUE CROSS/BLUE SHIELD | Admitting: Family Medicine

## 2014-12-30 VITALS — BP 114/82 | HR 81 | Ht 64.0 in | Wt 169.0 lb

## 2014-12-30 DIAGNOSIS — M9903 Segmental and somatic dysfunction of lumbar region: Secondary | ICD-10-CM

## 2014-12-30 DIAGNOSIS — M9901 Segmental and somatic dysfunction of cervical region: Secondary | ICD-10-CM

## 2014-12-30 DIAGNOSIS — M9908 Segmental and somatic dysfunction of rib cage: Secondary | ICD-10-CM | POA: Diagnosis not present

## 2014-12-30 DIAGNOSIS — M542 Cervicalgia: Secondary | ICD-10-CM

## 2014-12-30 DIAGNOSIS — M999 Biomechanical lesion, unspecified: Secondary | ICD-10-CM

## 2014-12-30 NOTE — Progress Notes (Signed)
Pre visit review using our clinic review tool, if applicable. No additional management support is needed unless otherwise documented below in the visit note. 

## 2014-12-30 NOTE — Assessment & Plan Note (Signed)
Decision today to treat with OMT was based on Physical Exam  After verbal consent patient was treated with HVLa and ME techniques in cervical, thoracic,rib   lumbar and sacrum areas, discussed other exercises as well.   Patient tolerated the procedure well with improvement in symptoms  Patient given exercises, stretches and lifestyle modifications  See medications in patient instructions if given  Patient will follow up in 3-6 weeks

## 2014-12-30 NOTE — Progress Notes (Signed)
  CC: Patient is a 49 year old female followup for neck pain  HPI: Patient was seen previously for neck pain. Has responded fairly well to conservative therapy for quite some time. Patient though is doing much better since her last exam. Patient is not doing as much lifting. Patient states that the discomfort that she was having previously is less severe as well. Denies any new symptoms.   Past medical, surgical, family and social history reviewed. Medications reviewed all in the electronic medical record.   Review of Systems: No headache, visual changes, nausea, vomiting, diarrhea, constipation, dizziness, abdominal pain, skin rash, fevers, chills, night sweats, weight loss, swollen lymph nodes, body aches, joint swelling, muscle aches, chest pain, shortness of breath, mood changes.   Objective:    Blood pressure 114/82, pulse 81, height 5\' 4"  (1.626 m), weight 169 lb (76.658 kg), SpO2 95 %.   General: No apparent distress alert and oriented x3 mood and affect normal, dressed appropriately.  HEENT: Pupils equal, extraocular movements intact Respiratory: Patient's speak in full sentences and does not appear short of breath Cardiovascular: No lower extremity edema, non tender, no erythema Skin: Warm dry intact with no signs of infection or rash on extremities or on axial skeleton. Abdomen: Soft nontender Neuro: Cranial nerves II through XII are intact, neurovascularly intact in all extremities with 2+ DTRs and 2+ pulses. Lymph: No lymphadenopathy of posterior or anterior cervical chain or axillae bilaterally.  Gait normal with good balance and coordination.  MSK: Non tender with full range of motion and good stability and symmetric strength and tone of , elbows, wrist, hip, knee and ankles bilaterally.  Neck: Inspection unremarkable. No palpable stepoffs. Negative Spurling's maneuver. Still some mild limitation in range of motion of the thoracic spine. Grip strength and sensation normal in  bilateral hands Strength good C4 to T1 distribution No sensory change to C4 to T1 Negative Hoffman sign bilaterally Reflexes normal    OMT Physical Exam  Cervical  C2 flexed rotated and side bent left C6 flexed rotated and side bent left  T1 E RS left with elevated rib T5 extended rotated and side bent right T8 extended rotated and side bent left     Impression and Recommendations:     This case required medical decision making of moderate complexity.

## 2014-12-30 NOTE — Assessment & Plan Note (Signed)
Discussed with patient at great length. Encourage her to do the exercises on a more regular basis. Patient is on many different medications for multiple number as needed. We discussed monitoring some of her diet to see if anything else is causing some exacerbation. Patient will continue to attempt to control her stress level as well. Patient come back and see me again in 4-5 weeks.

## 2014-12-30 NOTE — Patient Instructions (Signed)
Great to see you Congrats on one house.  Continue the exercises Much better than last time.  Continue the vitamins Lets say 4 weeks.

## 2015-01-27 ENCOUNTER — Ambulatory Visit (INDEPENDENT_AMBULATORY_CARE_PROVIDER_SITE_OTHER): Payer: BLUE CROSS/BLUE SHIELD | Admitting: Family Medicine

## 2015-01-27 ENCOUNTER — Encounter: Payer: Self-pay | Admitting: Family Medicine

## 2015-01-27 VITALS — BP 114/82 | HR 75 | Ht 64.0 in | Wt 169.0 lb

## 2015-01-27 DIAGNOSIS — M542 Cervicalgia: Secondary | ICD-10-CM

## 2015-01-27 DIAGNOSIS — M9903 Segmental and somatic dysfunction of lumbar region: Secondary | ICD-10-CM | POA: Diagnosis not present

## 2015-01-27 DIAGNOSIS — M9901 Segmental and somatic dysfunction of cervical region: Secondary | ICD-10-CM

## 2015-01-27 DIAGNOSIS — M999 Biomechanical lesion, unspecified: Secondary | ICD-10-CM

## 2015-01-27 DIAGNOSIS — M9908 Segmental and somatic dysfunction of rib cage: Secondary | ICD-10-CM | POA: Diagnosis not present

## 2015-01-27 NOTE — Patient Instructions (Signed)
Overall doing fantastic Get that daughter to help you with some of the painting Continue what you are doing. Try to work on posture when you can.  Have a great thanksgiving Happy holidays! See me before the end of the year.

## 2015-01-27 NOTE — Assessment & Plan Note (Signed)
Decision today to treat with OMT was based on Physical Exam  After verbal consent patient was treated with HVLa and ME techniques in cervical, thoracic,rib   lumbar and sacrum areas, discussed other exercises as well.   Patient tolerated the procedure well with improvement in symptoms  Patient given exercises, stretches and lifestyle modifications  See medications in patient instructions if given  Patient will follow up in 3-6 weeks                                        

## 2015-01-27 NOTE — Progress Notes (Signed)
  CC: Patient is a 49 year old female followup for neck pain  HPI: Patient was seen previously for neck pain. Has responded fairly well to conservative therapy for quite some time. Patient is doing relatively well. Continues to be painting her house. This does cause her to have some more increasing neck and upper back pain. No radiation down the arms. States that if she does the exercises she seems to be doing relatively well.   Past medical, surgical, family and social history reviewed. Medications reviewed all in the electronic medical record.   Review of Systems: No headache, visual changes, nausea, vomiting, diarrhea, constipation, dizziness, abdominal pain, skin rash, fevers, chills, night sweats, weight loss, swollen lymph nodes, body aches, joint swelling, muscle aches, chest pain, shortness of breath, mood changes.   Objective:    Blood pressure 114/82, pulse 75, height 5\' 4"  (1.626 m), weight 169 lb (76.658 kg), SpO2 98 %.   General: No apparent distress alert and oriented x3 mood and affect normal, dressed appropriately.  HEENT: Pupils equal, extraocular movements intact Respiratory: Patient's speak in full sentences and does not appear short of breath Cardiovascular: No lower extremity edema, non tender, no erythema Skin: Warm dry intact with no signs of infection or rash on extremities or on axial skeleton. Abdomen: Soft nontender Neuro: Cranial nerves II through XII are intact, neurovascularly intact in all extremities with 2+ DTRs and 2+ pulses. Lymph: No lymphadenopathy of posterior or anterior cervical chain or axillae bilaterally.  Gait normal with good balance and coordination.  MSK: Non tender with full range of motion and good stability and symmetric strength and tone of , elbows, wrist, hip, knee and ankles bilaterally.  Neck: Inspection unremarkable. No palpable stepoffs. Negative Spurling's maneuver. Mild tightness noted of the neck compared to previous exam Grip  strength and sensation normal in bilateral hands Strength good C4 to T1 distribution No sensory change to C4 to T1 Negative Hoffman sign bilaterally Reflexes normal    OMT Physical Exam  Cervical  C2 flexed rotated and side bent left C6 flexed rotated and side bent left  T1 E RS left with elevated rib T5 extended rotated and side bent right T8 extended rotated and side bent left L2 flexed rotated and side bent right Sacrum left on left   Impression and Recommendations:     This case required medical decision making of moderate complexity.

## 2015-01-27 NOTE — Assessment & Plan Note (Signed)
Patient continues to have difficulty with posture. With patient doing more overhead activity we discussed keeping hands within peripheral vision. We discussed using proper materials. Patient will do stretches afterwards. We discussed with all the extension consider more flexion exercises. Patient will continue to monitor. Continue the same vitamins. See patient again in 4-6 weeks for further evaluation and treatment.

## 2015-01-27 NOTE — Progress Notes (Signed)
Pre visit review using our clinic review tool, if applicable. No additional management support is needed unless otherwise documented below in the visit note. 

## 2015-03-03 ENCOUNTER — Ambulatory Visit (INDEPENDENT_AMBULATORY_CARE_PROVIDER_SITE_OTHER): Payer: BLUE CROSS/BLUE SHIELD | Admitting: Family Medicine

## 2015-03-03 ENCOUNTER — Other Ambulatory Visit: Payer: Self-pay

## 2015-03-03 ENCOUNTER — Encounter: Payer: Self-pay | Admitting: Family Medicine

## 2015-03-03 VITALS — BP 118/80 | HR 86 | Ht 64.0 in | Wt 168.0 lb

## 2015-03-03 DIAGNOSIS — M9901 Segmental and somatic dysfunction of cervical region: Secondary | ICD-10-CM

## 2015-03-03 DIAGNOSIS — M999 Biomechanical lesion, unspecified: Secondary | ICD-10-CM

## 2015-03-03 DIAGNOSIS — M9903 Segmental and somatic dysfunction of lumbar region: Secondary | ICD-10-CM | POA: Diagnosis not present

## 2015-03-03 DIAGNOSIS — M542 Cervicalgia: Secondary | ICD-10-CM

## 2015-03-03 DIAGNOSIS — M9908 Segmental and somatic dysfunction of rib cage: Secondary | ICD-10-CM

## 2015-03-03 DIAGNOSIS — M7711 Lateral epicondylitis, right elbow: Secondary | ICD-10-CM

## 2015-03-03 MED ORDER — DICLOFENAC SODIUM 2 % TD SOLN: TRANSDERMAL | Status: DC

## 2015-03-03 NOTE — Assessment & Plan Note (Signed)
New problem. Worsening. Prescription for anti-inflammatory given. Home exercises and wrist brace given. 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 Patient come back and see me again in 4 weeks to make sure resolving. Discussed ergonomics at work.

## 2015-03-03 NOTE — Patient Instructions (Addendum)
Good to see you.  Ice the elbow after activity Wear wrist brace day and night for 1 week then nightly for 2 weeks.  pennsaid pinkie amount topically 2 times daily as needed.  Exercises 3 times a week.  Try may be a jar opener See me again in 3-4 weeks Happy New Year!

## 2015-03-03 NOTE — Progress Notes (Signed)
Pre visit review using our clinic review tool, if applicable. No additional management support is needed unless otherwise documented below in the visit note. 

## 2015-03-03 NOTE — Assessment & Plan Note (Signed)
Decision today to treat with OMT was based on Physical Exam  After verbal consent patient was treated with HVLa and ME techniques in cervical, thoracic,rib   lumbar and sacrum areas, discussed other exercises as well.   Patient tolerated the procedure well with improvement in symptoms  Patient given exercises, stretches and lifestyle modifications  See medications in patient instructions if given  Patient will follow up in 3-4 weeks

## 2015-03-03 NOTE — Progress Notes (Signed)
  CC: Patient is a 49 year old female followup for neck pain  HPI: Patient was seen previously for neck pain. Has responded fairly well to conservative therapy for quite some time. Patient is still having significant tightness and would state that she has worsening over the course of time since the move and moving the boxes. Patient has noticed some increasing tightness. No radiation down the arm or any numbness.  Patient does have a new problem. Right elbow pain. States it is more of a dull, throbbing aching sensation. States that it has affects some of her daily activities. Patient rates the severity of 6 out of 10. States that he can even wake her up at night. Has not tried any home modalities at this point..   Past medical, surgical, family and social history reviewed. Medications reviewed all in the electronic medical record.   Review of Systems: No headache, visual changes, nausea, vomiting, diarrhea, constipation, dizziness, abdominal pain, skin rash, fevers, chills, night sweats, weight loss, swollen lymph nodes, body aches, joint swelling, muscle aches, chest pain, shortness of breath, mood changes.   Objective:    Blood pressure 118/80, pulse 86, height 5\' 4"  (1.626 m), weight 168 lb (76.204 kg), SpO2 98 %.   General: No apparent distress alert and oriented x3 mood and affect normal, dressed appropriately.  HEENT: Pupils equal, extraocular movements intact Respiratory: Patient's speak in full sentences and does not appear short of breath Cardiovascular: No lower extremity edema, non tender, no erythema Skin: Warm dry intact with no signs of infection or rash on extremities or on axial skeleton. Abdomen: Soft nontender Neuro: Cranial nerves II through XII are intact, neurovascularly intact in all extremities with 2+ DTRs and 2+ pulses. Lymph: No lymphadenopathy of posterior or anterior cervical chain or axillae bilaterally.  Gait normal with good balance and coordination.  MSK: Non  tender with full range of motion and good stability and symmetric strength and tone of , shoulders, wrist, hip, knee and ankles bilaterally.  Neck: Inspection unremarkable. No palpable stepoffs. Negative Spurling's maneuver. Continued tenderness of the neck mostly on the left side Grip strength and sensation normal in bilateral hands Strength good C4 to T1 distribution No sensory change to C4 to T1 Negative Hoffman sign bilaterally Reflexes normal  Elbow: Right Unremarkable to inspection. Range of motion full pronation, supination, flexion, extension. Strength is full to all of the above directions Stable to varus, valgus stress. Negative moving valgus stress test. Tender to palpation over the lateral epicondylar region Ulnar nerve does not sublux. Negative cubital tunnel Tinel's. Contralateral elbow unremarkable  OMT Physical Exam  Cervical  C2 flexed rotated and side bent left C6 flexed rotated and side bent left  T1 E RS left with elevated rib T6 extended rotated and side bent left L2 flexed rotated and side bent right Sacrum left on left  Procedure note 97110; 15 minutes spent for Therapeutic exercises as stated in above notes.  This included exercises focusing on stretching, strengthening, with significant focus on eccentric aspects.  Flexion and extension exercises working on eccentric as well as pronation and overpronation and supination exercises given. Proper technique shown and discussed handout in great detail with ATC.  All questions were discussed and answered.     Impression and Recommendations:     This case required medical decision making of moderate complexity.

## 2015-03-03 NOTE — Assessment & Plan Note (Signed)
Continues to give her difficulty overall. We discussed icing regimen and home exercises. Patient will be given up her prescription for topical anti-inflammatory that I'll hopefully beneficial as well. Discussed ergonomics in different changes throughout the day they can be beneficial. Continues to respond well to manipulation. We will continue to see her on a regular basis at 4 week intervals at this time.

## 2015-03-31 ENCOUNTER — Encounter: Payer: Self-pay | Admitting: Family Medicine

## 2015-03-31 ENCOUNTER — Ambulatory Visit (INDEPENDENT_AMBULATORY_CARE_PROVIDER_SITE_OTHER): Payer: BLUE CROSS/BLUE SHIELD | Admitting: Family Medicine

## 2015-03-31 VITALS — BP 122/82 | HR 86 | Ht 64.0 in | Wt 170.0 lb

## 2015-03-31 DIAGNOSIS — M542 Cervicalgia: Secondary | ICD-10-CM | POA: Diagnosis not present

## 2015-03-31 DIAGNOSIS — M9903 Segmental and somatic dysfunction of lumbar region: Secondary | ICD-10-CM

## 2015-03-31 DIAGNOSIS — M9902 Segmental and somatic dysfunction of thoracic region: Secondary | ICD-10-CM

## 2015-03-31 DIAGNOSIS — M9908 Segmental and somatic dysfunction of rib cage: Secondary | ICD-10-CM

## 2015-03-31 DIAGNOSIS — M9901 Segmental and somatic dysfunction of cervical region: Secondary | ICD-10-CM | POA: Diagnosis not present

## 2015-03-31 DIAGNOSIS — M999 Biomechanical lesion, unspecified: Secondary | ICD-10-CM

## 2015-03-31 NOTE — Assessment & Plan Note (Signed)
Decision today to treat with OMT was based on Physical Exam  After verbal consent patient was treated with HVLa and ME techniques in cervical, thoracic,rib   lumbar and sacrum areas, discussed other exercises as well.   Patient tolerated the procedure well with improvement in symptoms  Patient given exercises, stretches and lifestyle modifications  See medications in patient instructions if given  Patient will follow up in 3-4 weeks                                            

## 2015-03-31 NOTE — Patient Instructions (Signed)
Good to see you Ice is your friend Heat compresses to leg though 10 minutes 2 times a day Can try arnica lotion as well to help break up the clot.  When picking up boxes then wear brace See me again in 4 weeks.

## 2015-03-31 NOTE — Assessment & Plan Note (Signed)
Overall still multifactorial. I do believe that patient's stress level and there is some mild psychosomatic. Patient does have some underlying arthritis noted on previous imaging. Encourage her to continue with the conservative therapy. Patient does not have any significant pain medication she is taking on a regular basis. Trying mostly with natural supplementations. We discussed taking ibuprofen on a more as-needed basis. Patient and will come back and see me again in 3-4 weeks. Continues to respond very well to osteopathic manipulation.

## 2015-03-31 NOTE — Progress Notes (Signed)
Pre visit review using our clinic review tool, if applicable. No additional management support is needed unless otherwise documented below in the visit note. 

## 2015-03-31 NOTE — Progress Notes (Signed)
CC: Patient is a 50 year old female followup for neck pain  HPI: Patient was seen previously for neck pain. Has responded fairly well to conservative therapy for quite some time.  Overall doing well as long as she continues to watch her posture. Has had difficult because she has moved recently. Patient is having some mild discomfort over all but continues to be able to do all daily activities and sleep comfortably at night. Has not been able to space out her office visits greater than 3-4 weeks.  Patient was having also right elbow pain. Were her brace on her wrist and has had significant decrease in pain. Continues to move boxes though with her moving. Has been doing a lot of housework as well. Overall better with no new symptoms.  Past Medical History  Diagnosis Date  . Subclinical hypothyroidism   . Migraines   . Asthma with allergic rhinitis   . Sleep apnea syndrome   . Elevated liver enzymes   . Liver disease     Fatty liver  . Polycystic ovaries 1984   Past Surgical History  Procedure Laterality Date  . Lithotripsy  2000  . Laproscopy  2011  . Laser ablation  2012   Social History  Substance Use Topics  . Smoking status: Never Smoker   . Smokeless tobacco: Never Used  . Alcohol Use: No   Allergies  Allergen Reactions  . Relafen [Nabumetone] Hives, Itching and Swelling  . Sulfa Antibiotics    Family History  Problem Relation Age of Onset  . Alcohol abuse Father   . Alcohol abuse Brother   . Coronary artery disease Maternal Grandmother   . Transient ischemic attack Maternal Grandmother   . Coronary artery disease Maternal Grandfather   . Heart attack Maternal Grandfather   . Transient ischemic attack Paternal Grandfather      Past medical, surgical, family and social history reviewed. Medications reviewed all in the electronic medical record.   Review of Systems: No headache, visual changes, nausea, vomiting, diarrhea, constipation, dizziness, abdominal pain,  skin rash, fevers, chills, night sweats, weight loss, swollen lymph nodes, body aches, joint swelling, muscle aches, chest pain, shortness of breath, mood changes.   Objective:    Blood pressure 122/82, pulse 86, height  (1.626 m), weight 170 lb (77.111 kg), SpO2 94 %.   General: No apparent distress alert and oriented x3 mood and affect normal, dressed appropriately.  HEENT: Pupils equal, extraocular movements intact Respiratory: Patient's speak in full sentences and does not appear short of breath Cardiovascular: No lower extremity edema, non tender, no erythema Skin: Warm dry intact with no signs of infection or rash on extremities or on axial skeleton. Abdomen: Soft nontender Neuro: Cranial nerves II through XII are intact, neurovascularly intact in all extremities with 2+ DTRs and 2+ pulses. Lymph: No lymphadenopathy of posterior or anterior cervical chain or axillae bilaterally.  Gait normal with good balance and coordination.  MSK: Non tender with full range of motion and good stability and symmetric strength and tone of , shoulders, wrist, hip, knee and ankles bilaterally.  Neck: Inspection unremarkable. No palpable stepoffs. Negative Spurling's maneuver. Continued tenderness of the neck mostly on the left side Grip strength and sensation normal in bilateral hands Strength good C4 to T1 distribution No sensory change to C4 to T1 Negative Hoffman sign bilaterally Reflexes normal   OMT Physical Exam  Cervical  C2 flexed rotated and side bent left C6 flexed rotated and side bent left  T1 E  RS left with elevated rib T6 extended rotated and side bent left L2 flexed rotated and side bent right Sacrum left on left      Impression and Recommendations:     This case required medical decision making of moderate complexity.

## 2015-04-28 ENCOUNTER — Ambulatory Visit: Payer: Self-pay | Admitting: Family Medicine

## 2015-05-04 ENCOUNTER — Ambulatory Visit: Payer: Self-pay | Admitting: Family Medicine

## 2015-05-13 ENCOUNTER — Encounter: Payer: Self-pay | Admitting: Family Medicine

## 2015-05-13 ENCOUNTER — Ambulatory Visit (INDEPENDENT_AMBULATORY_CARE_PROVIDER_SITE_OTHER): Payer: BLUE CROSS/BLUE SHIELD | Admitting: Family Medicine

## 2015-05-13 VITALS — BP 122/80 | HR 91 | Wt 174.0 lb

## 2015-05-13 DIAGNOSIS — M9903 Segmental and somatic dysfunction of lumbar region: Secondary | ICD-10-CM | POA: Diagnosis not present

## 2015-05-13 DIAGNOSIS — M9901 Segmental and somatic dysfunction of cervical region: Secondary | ICD-10-CM | POA: Diagnosis not present

## 2015-05-13 DIAGNOSIS — M9908 Segmental and somatic dysfunction of rib cage: Secondary | ICD-10-CM | POA: Diagnosis not present

## 2015-05-13 DIAGNOSIS — M7711 Lateral epicondylitis, right elbow: Secondary | ICD-10-CM

## 2015-05-13 DIAGNOSIS — M999 Biomechanical lesion, unspecified: Secondary | ICD-10-CM

## 2015-05-13 DIAGNOSIS — M9902 Segmental and somatic dysfunction of thoracic region: Secondary | ICD-10-CM

## 2015-05-13 DIAGNOSIS — M542 Cervicalgia: Secondary | ICD-10-CM

## 2015-05-13 NOTE — Patient Instructions (Signed)
Good to see you  Ice is your friend  Drops for the eyes.  Keep hands underhand or thumbs up with lifting.  Wear brace at night  Continue the vitamins See me again in 4-5 weeks.

## 2015-05-13 NOTE — Assessment & Plan Note (Signed)
Still multifactorial. We discussed with patient about ergonomics at the day. Patient will consider aching breaks throughout her yard work. We discussed doing the exercises regularly. We discussed again formal physical therapy which patient declined. Patient will come back and see me again in 4-5 weeks for further evaluation and treatment. Continues respond well to osteopathic manipulation.

## 2015-05-13 NOTE — Assessment & Plan Note (Signed)
Mild worsening symptoms at this time. Patient encouraged to go back to do the conservative therapy and a regular basis. Declined formal physical therapy or an injection. Patient follow-up again in 4 weeks.

## 2015-05-13 NOTE — Progress Notes (Signed)
CC: Patient is a 50 year old female followup for neck pain  HPI: Patient was seen previously for neck pain. Has responded fairly well to conservative therapy for quite some time. Some increasing tightness secondary to her doing yard work recently. Patient states that it is not severe but it does give her some difficulty with range of motion especially side bending and rotation. Patient states it was uncomfortable last night. Denies any radiation.  Patient was having also right elbow pain. Patient was doing better but is having have increasing pain again. Patient states that the yard work also seem to exacerbate this. Severe but does notice it. Not doing the exercises or wearing the brace. Has not tried the topical anti-inflammatory in this area on a regular basis.  Past Medical History  Diagnosis Date  . Subclinical hypothyroidism   . Migraines   . Asthma with allergic rhinitis   . Sleep apnea syndrome   . Elevated liver enzymes   . Liver disease     Fatty liver  . Polycystic ovaries 1984   Past Surgical History  Procedure Laterality Date  . Lithotripsy  2000  . Laproscopy  2011  . Laser ablation  2012   Social History  Substance Use Topics  . Smoking status: Never Smoker   . Smokeless tobacco: Never Used  . Alcohol Use: No   Allergies  Allergen Reactions  . Relafen [Nabumetone] Hives, Itching and Swelling  . Sulfa Antibiotics    Family History  Problem Relation Age of Onset  . Alcohol abuse Father   . Alcohol abuse Brother   . Coronary artery disease Maternal Grandmother   . Transient ischemic attack Maternal Grandmother   . Coronary artery disease Maternal Grandfather   . Heart attack Maternal Grandfather   . Transient ischemic attack Paternal Grandfather      Past medical, surgical, family and social history reviewed. Medications reviewed all in the electronic medical record.   Review of Systems: No headache, visual changes, nausea, vomiting, diarrhea,  constipation, dizziness, abdominal pain, skin rash, fevers, chills, night sweats, weight loss, swollen lymph nodes, body aches, joint swelling, muscle aches, chest pain, shortness of breath, mood changes.   Objective:    Blood pressure 122/80, pulse 91, weight 174 lb (78.926 kg), SpO2 98 %.   General: No apparent distress alert and oriented x3 mood and affect normal, dressed appropriately.  HEENT: Pupils equal, extraocular movements intact Respiratory: Patient's speak in full sentences and does not appear short of breath Cardiovascular: No lower extremity edema, non tender, no erythema Skin: Warm dry intact with no signs of infection or rash on extremities or on axial skeleton. Abdomen: Soft nontender Neuro: Cranial nerves II through XII are intact, neurovascularly intact in all extremities with 2+ DTRs and 2+ pulses. Lymph: No lymphadenopathy of posterior or anterior cervical chain or axillae bilaterally.  Gait normal with good balance and coordination.  MSK: Non tender with full range of motion and good stability and symmetric strength and tone of , shoulders, wrist, hip, knee and ankles bilaterally.  Neck: Inspection unremarkable. No palpable stepoffs. Negative Spurling's maneuver. Tenderness still on the left side with range of motion lacking the last 5 bilaterally with rotation. Grip strength and sensation normal in bilateral hands Strength good C4 to T1 distribution No sensory change to C4 to T1 Negative Hoffman sign bilaterally Reflexes normal Elbow is very tender to palpation over lateral patellar region. Some pain with resisted dorsiflexion of the middle finger. Full strength. Neurovascular intact distally with good  grip strength  OMT Physical Exam  Cervical  C2 flexed rotated and side bent left C6 flexed rotated and side bent left T1 E RS left with elevated rib T5 extended rotated and side bent left L2 flexed rotated and side bent right Sacrum left on  left   Impression and Recommendations:     This case required medical decision making of moderate complexity.

## 2015-05-13 NOTE — Assessment & Plan Note (Signed)
Decision today to treat with OMT was based on Physical Exam  After verbal consent patient was treated with HVLa and ME techniques in cervical, thoracic,rib   lumbar and sacrum areas, discussed other exercises as well.   Patient tolerated the procedure well with improvement in symptoms  Patient given exercises, stretches and lifestyle modifications  See medications in patient instructions if given  Patient will follow up in 3-4 weeks                                            

## 2015-05-15 ENCOUNTER — Ambulatory Visit: Payer: Self-pay | Admitting: Family Medicine

## 2015-05-29 ENCOUNTER — Encounter: Payer: Self-pay | Admitting: Family Medicine

## 2015-06-16 ENCOUNTER — Encounter: Payer: Self-pay | Admitting: Family Medicine

## 2015-06-16 ENCOUNTER — Ambulatory Visit (INDEPENDENT_AMBULATORY_CARE_PROVIDER_SITE_OTHER): Payer: BLUE CROSS/BLUE SHIELD | Admitting: Family Medicine

## 2015-06-16 VITALS — BP 120/82 | HR 96 | Ht 64.0 in | Wt 179.0 lb

## 2015-06-16 DIAGNOSIS — M9901 Segmental and somatic dysfunction of cervical region: Secondary | ICD-10-CM | POA: Diagnosis not present

## 2015-06-16 DIAGNOSIS — M999 Biomechanical lesion, unspecified: Secondary | ICD-10-CM

## 2015-06-16 DIAGNOSIS — M542 Cervicalgia: Secondary | ICD-10-CM | POA: Diagnosis not present

## 2015-06-16 DIAGNOSIS — M9903 Segmental and somatic dysfunction of lumbar region: Secondary | ICD-10-CM

## 2015-06-16 DIAGNOSIS — M9908 Segmental and somatic dysfunction of rib cage: Secondary | ICD-10-CM

## 2015-06-16 DIAGNOSIS — M9902 Segmental and somatic dysfunction of thoracic region: Secondary | ICD-10-CM | POA: Diagnosis not present

## 2015-06-16 NOTE — Progress Notes (Signed)
Pre visit review using our clinic review tool, if applicable. No additional management support is needed unless otherwise documented below in the visit note. 

## 2015-06-16 NOTE — Progress Notes (Signed)
CC: Patient is a 50 year old female followup for neck pain  HPI: Patient was seen previously for neck pain. Has responded fairly well to conservative therapy for quite some time. Some increasing tightness secondary to her doing yard work recently. 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 Some mild increase in back discomfort. Ms. Working with her integrative physician to see if she can do anything else with the inflammation of her body. No new symptoms just worsening of previous symptoms.  Patient was having also right elbow pain.patient states that it is improving overall. Did have an episode on the left side but only last double days and then seemed to resolve on its own.  Past Medical History  Diagnosis Date  . Subclinical hypothyroidism   . Migraines   . Asthma with allergic rhinitis   . Sleep apnea syndrome   . Elevated liver enzymes   . Liver disease     Fatty liver  . Polycystic ovaries 1984   Past Surgical History  Procedure Laterality Date  . Lithotripsy  2000  . Laproscopy  2011  . Laser ablation  2012   Social History  Substance Use Topics  . Smoking status: Never Smoker   . Smokeless tobacco: Never Used  . Alcohol Use: No   Allergies  Allergen Reactions  . Relafen [Nabumetone] Hives, Itching and Swelling  . Sulfa Antibiotics    Family History  Problem Relation Age of Onset  . Alcohol abuse Father   . Alcohol abuse Brother   . Coronary artery disease Maternal Grandmother   . Transient ischemic attack Maternal Grandmother   . Coronary artery disease Maternal Grandfather   . Heart attack Maternal Grandfather   . Transient ischemic attack Paternal Grandfather      Past medical, surgical, family and social history reviewed. Medications reviewed all in the electronic medical record.   Review of Systems: No headache, visual changes, nausea, vomiting, diarrhea, constipation, dizziness, abdominal pain, skin rash,  fevers, chills, night sweats, weight loss, swollen lymph nodes, body aches, joint swelling, muscle aches, chest pain, shortness of breath, mood changes.   Objective:    Blood pressure 120/82, pulse 96, height  (1.626 m), weight 179 lb (81.194 kg), SpO2 98 %.   General: No apparent distress alert and oriented x3 mood and affect normal, dressed appropriately.  HEENT: Pupils equal, extraocular movements intact Respiratory: Patient's speak in full sentences and does not appear short of breath Cardiovascular: No lower extremity edema, non tender, no erythema Skin: Warm dry intact with no signs of infection or rash on extremities or on axial skeleton. Abdomen: Soft nontender Neuro: Cranial nerves II through XII are intact, neurovascularly intact in all extremities with 2+ DTRs and 2+ pulses. Lymph: No lymphadenopathy of posterior or anterior cervical chain or axillae bilaterally.  Gait normal with good balance and coordination.  MSK: Non tender with full range of motion and good stability and symmetric strength and tone of , shoulders, wrist, hip, knee and ankles bilaterally.  Neck: Inspection unremarkable. No palpable stepoffs. Negative Spurling's maneuver. Less tenderness than previous exam the patient does have tightness bilaterally Grip strength and sensation normal in bilateral hands Strength good C4 to T1 distribution No sensory change to C4 to T1 Negative Hoffman sign bilaterally Reflexes normal   OMT Physical Exam  Cervical  C2 flexed rotated and side bent left C6 flexed rotated and side bent right T3 E RS left with elevated rib T5  extended rotated and side bent left L2 flexed rotated and side bent right Sacrum left on left   Impression and Recommendations:     This case required medical decision making of moderate complexity.

## 2015-06-16 NOTE — Assessment & Plan Note (Signed)
Patient still has some significant stress as well as some increasing tightness. I do think that this is multifactorial. Sometimes I do think that it is likely causing some of her migraines as well. We discussed more ergonomic. We discussed different medication and could also be beneficial. We discussed icing. Patient will come back and see me again in 4-6 weeks for further evaluation and treatment. Continues respond well to osteopathic manipulation.

## 2015-06-16 NOTE — Patient Instructions (Signed)
Good to see you  I am glad you fixed the elbow DHEA 50mg  daily for 1 month could help a little with pain and inflammation and give you energy Otherwise keep up with the exercise and find time for yourself See me again in 4-6 weeks.

## 2015-06-16 NOTE — Assessment & Plan Note (Signed)
Decision today to treat with OMT was based on Physical Exam  After verbal consent patient was treated with HVLa and ME techniques in cervical, thoracic,rib   lumbar and sacrum areas, discussed other exercises as well.   Patient tolerated the procedure well with improvement in symptoms  Patient given exercises, stretches and lifestyle modifications  See medications in patient instructions if given  Patient will follow up in 4-6 weeks

## 2015-06-17 ENCOUNTER — Ambulatory Visit: Payer: Self-pay | Admitting: Family Medicine

## 2015-07-21 ENCOUNTER — Ambulatory Visit: Payer: Self-pay | Admitting: Family Medicine

## 2015-07-23 ENCOUNTER — Ambulatory Visit (INDEPENDENT_AMBULATORY_CARE_PROVIDER_SITE_OTHER): Payer: BLUE CROSS/BLUE SHIELD | Admitting: Family Medicine

## 2015-07-23 ENCOUNTER — Encounter: Payer: Self-pay | Admitting: Family Medicine

## 2015-07-23 VITALS — BP 124/84 | HR 86 | Ht 64.0 in | Wt 182.0 lb

## 2015-07-23 DIAGNOSIS — M545 Low back pain, unspecified: Secondary | ICD-10-CM

## 2015-07-23 DIAGNOSIS — M9902 Segmental and somatic dysfunction of thoracic region: Secondary | ICD-10-CM

## 2015-07-23 DIAGNOSIS — M9901 Segmental and somatic dysfunction of cervical region: Secondary | ICD-10-CM | POA: Diagnosis not present

## 2015-07-23 DIAGNOSIS — M9903 Segmental and somatic dysfunction of lumbar region: Secondary | ICD-10-CM

## 2015-07-23 DIAGNOSIS — M999 Biomechanical lesion, unspecified: Secondary | ICD-10-CM

## 2015-07-23 DIAGNOSIS — M542 Cervicalgia: Secondary | ICD-10-CM

## 2015-07-23 NOTE — Assessment & Plan Note (Signed)
Decision today to treat with OMT was based on Physical Exam  After verbal consent patient was treated with HVLa and ME techniques in cervical, thoracic,rib   lumbar and sacrum areas, discussed other exercises as well.   Patient tolerated the procedure well with improvement in symptoms  Patient given exercises, stretches and lifestyle modifications  See medications in patient instructions if given  Patient will follow up in 4 weeks                

## 2015-07-23 NOTE — Assessment & Plan Note (Addendum)
Patient is having some mild sacroiliac pain bilaterally. I do think that this is causing some greater trochanteric bursitis. Likely just secondary to the repetitive activity recently.we'll monitor. Given different exercises and streamline the ones she has.

## 2015-07-23 NOTE — Progress Notes (Signed)
Pre visit review using our clinic review tool, if applicable. No additional management support is needed unless otherwise documented below in the visit note. 

## 2015-07-23 NOTE — Patient Instructions (Signed)
Good to see you  Do only the new sheet I think  New shoes will make a difference  Keep up with being active Stretch after being outside and on the hill Ice as well See me again in 4 weeks.

## 2015-07-23 NOTE — Progress Notes (Signed)
CC: Patient is a 50 year old female followup for neck pain  HPI: Patient was seen previously for neck pain. Has responded fairly well to conservative therapy for quite some time. Patient continues to do some yard work. Was on a hill. Feels that her left hip and right hip seems to be more painful on the lateral aspect. This is little different than her usual pain. States that the neck pain is also given her some discomfort. Not as bad as it usually does. She will be getting somebody else to do yard work since.    Past Medical History  Diagnosis Date  . Subclinical hypothyroidism   . Migraines   . Asthma with allergic rhinitis   . Sleep apnea syndrome   . Elevated liver enzymes   . Liver disease     Fatty liver  . Polycystic ovaries 1984   Past Surgical History  Procedure Laterality Date  . Lithotripsy  2000  . Laproscopy  2011  . Laser ablation  2012   Social History  Substance Use Topics  . Smoking status: Never Smoker   . Smokeless tobacco: Never Used  . Alcohol Use: No   Allergies  Allergen Reactions  . Relafen [Nabumetone] Hives, Itching and Swelling  . Sulfa Antibiotics    Family History  Problem Relation Age of Onset  . Alcohol abuse Father   . Alcohol abuse Brother   . Coronary artery disease Maternal Grandmother   . Transient ischemic attack Maternal Grandmother   . Coronary artery disease Maternal Grandfather   . Heart attack Maternal Grandfather   . Transient ischemic attack Paternal Grandfather      Past medical, surgical, family and social history reviewed. Medications reviewed all in the electronic medical record.   Review of Systems: No headache, visual changes, nausea, vomiting, diarrhea, constipation, dizziness, abdominal pain, skin rash, fevers, chills, night sweats, weight loss, swollen lymph nodes, body aches, joint swelling, muscle aches, chest pain, shortness of breath, mood changes.   Objective:    Blood pressure 124/84, pulse 86, height 5'  4" (1.626 m), weight 182 lb (82.555 kg), SpO2 98 %.   General: No apparent distress alert and oriented x3 mood and affect normal, dressed appropriately.  HEENT: Pupils equal, extraocular movements intact Respiratory: Patient's speak in full sentences and does not appear short of breath Cardiovascular: No lower extremity edema, non tender, no erythema Skin: Warm dry intact with no signs of infection or rash on extremities or on axial skeleton. Abdomen: Soft nontender Neuro: Cranial nerves II through XII are intact, neurovascularly intact in all extremities with 2+ DTRs and 2+ pulses. Lymph: No lymphadenopathy of posterior or anterior cervical chain or axillae bilaterally.  Gait normal with good balance and coordination.  MSK: Non tender with full range of motion and good stability and symmetric strength and tone of , shoulders, wrist, hip, knee and ankles bilaterally.  Neck: Inspection unremarkable. No palpable stepoffs. Negative Spurling's maneuver. Still lacking the last 5 of rotation and side bending bilaterally Grip strength and sensation normal in bilateral hands Strength good C4 to T1 distribution No sensory change to C4 to T1 Negative Hoffman sign bilaterally Reflexes normal Back exam shows the patient does have positive Faber bilaterally. Patient is tender to palpation over the greater trochanteric bursitis.   OMT Physical Exam  Cervical  C2 flexed rotated and side bent left C5 flexed rotated and side bent right T3 E RS left with elevated rib T7 extended rotated and side bent left L2 flexed rotated  and side bent right Sacrum left on left   Impression and Recommendations:     This case required medical decision making of moderate complexity.

## 2015-07-23 NOTE — Assessment & Plan Note (Signed)
Continues to have some bilateral pain in the neck. Seems to be more tightness. Encourage her to continue the exercises and the ergonomics are up-to-date. I believe the patient is having some increasing stress that is likely also contribute in. Do not make any drastic changes at this moment. Patient will follow-up with me again in 4 weeks for further evaluation and treatment. Continues respond fairly well to osteopathic manipulation.

## 2015-08-20 ENCOUNTER — Ambulatory Visit (INDEPENDENT_AMBULATORY_CARE_PROVIDER_SITE_OTHER): Payer: BLUE CROSS/BLUE SHIELD | Admitting: Family Medicine

## 2015-08-20 ENCOUNTER — Encounter: Payer: Self-pay | Admitting: Family Medicine

## 2015-08-20 VITALS — BP 118/84 | HR 75 | Ht 64.0 in | Wt 180.0 lb

## 2015-08-20 DIAGNOSIS — M542 Cervicalgia: Secondary | ICD-10-CM | POA: Diagnosis not present

## 2015-08-20 DIAGNOSIS — M9903 Segmental and somatic dysfunction of lumbar region: Secondary | ICD-10-CM

## 2015-08-20 DIAGNOSIS — M999 Biomechanical lesion, unspecified: Secondary | ICD-10-CM

## 2015-08-20 DIAGNOSIS — M9901 Segmental and somatic dysfunction of cervical region: Secondary | ICD-10-CM

## 2015-08-20 DIAGNOSIS — M9902 Segmental and somatic dysfunction of thoracic region: Secondary | ICD-10-CM

## 2015-08-20 NOTE — Assessment & Plan Note (Signed)
Decision today to treat with OMT was based on Physical Exam  After verbal consent patient was treated with HVLa and ME techniques in cervical, thoracic,rib   lumbar and sacrum areas, discussed other exercises as well.   Patient tolerated the procedure well with improvement in symptoms  Patient given exercises, stretches and lifestyle modifications  See medications in patient instructions if given  Patient will follow up in 6-7 weeks

## 2015-08-20 NOTE — Patient Instructions (Signed)
Good to see you  Ice is your friend still Try muscle relaxer for the next 3 nights.  Take your pick.  Keep doing the exercises except for the one on the knee and try the alternative one I showed you  I like the shoes but do not lace the last eye See me again in 7 weeks!

## 2015-08-20 NOTE — Progress Notes (Signed)
CC: Patient is a 50 year old female followup for neck pain  HPI: Patient was seen previously for neck pain. Has responded fairly well to conservative therapy for quite some time. Overall patient thinks she is doing relatively well. Having some mild discomfort but nothing that is severe. States that all right knee has some mild discomfort but seems to be improving as well. Seems to be related to her doing some the exercises. She was having some foot pain but change to different shoes and this has helped as well.   Past Medical History  Diagnosis Date  . Subclinical hypothyroidism   . Migraines   . Asthma with allergic rhinitis   . Sleep apnea syndrome   . Elevated liver enzymes   . Liver disease     Fatty liver  . Polycystic ovaries 1984   Past Surgical History  Procedure Laterality Date  . Lithotripsy  2000  . Laproscopy  2011  . Laser ablation  2012   Social History  Substance Use Topics  . Smoking status: Never Smoker   . Smokeless tobacco: Never Used  . Alcohol Use: No   Allergies  Allergen Reactions  . Relafen [Nabumetone] Hives, Itching and Swelling  . Sulfa Antibiotics    Family History  Problem Relation Age of Onset  . Alcohol abuse Father   . Alcohol abuse Brother   . Coronary artery disease Maternal Grandmother   . Transient ischemic attack Maternal Grandmother   . Coronary artery disease Maternal Grandfather   . Heart attack Maternal Grandfather   . Transient ischemic attack Paternal Grandfather      Past medical, surgical, family and social history reviewed. Medications reviewed all in the electronic medical record.   Review of Systems: No headache, visual changes, nausea, vomiting, diarrhea, constipation, dizziness, abdominal pain, skin rash, fevers, chills, night sweats, weight loss, swollen lymph nodes, body aches, joint swelling, muscle aches, chest pain, shortness of breath, mood changes.   Objective:    Blood pressure 118/84, pulse 75, height 5'  4" (1.626 m), weight 180 lb (81.647 kg), SpO2 98 %.   General: No apparent distress alert and oriented x3 mood and affect normal, dressed appropriately.  HEENT: Pupils equal, extraocular movements intact Respiratory: Patient's speak in full sentences and does not appear short of breath Cardiovascular: No lower extremity edema, non tender, no erythema Skin: Warm dry intact with no signs of infection or rash on extremities or on axial skeleton. Abdomen: Soft nontender Neuro: Cranial nerves II through XII are intact, neurovascularly intact in all extremities with 2+ DTRs and 2+ pulses. Lymph: No lymphadenopathy of posterior or anterior cervical chain or axillae bilaterally.  Gait normal with good balance and coordination.  MSK: Non tender with full range of motion and good stability and symmetric strength and tone of , shoulders, wrist, hip, knee and ankles bilaterally.  Neck: Inspection unremarkable. No palpable stepoffs. Negative Spurling's maneuver. Still lacking the last 5 of rotation and side bending bilaterally Grip strength and sensation normal in bilateral hands Strength good C4 to T1 distribution No sensory change to C4 to T1 Negative Hoffman sign bilaterally Reflexes normal Mild increasing tightness of the trapezius on the left side.   OMT Physical Exam  Cervical  C2 flexed rotated and side bent left C6 flexed rotated and side bent right T3 E RS left with elevated rib T5 extended rotated and side bent left L2 flexed rotated and side bent right Sacrum left on left   Impression and Recommendations:  This case required medical decision making of moderate complexity.

## 2015-08-20 NOTE — Progress Notes (Signed)
Pre visit review using our clinic review tool, if applicable. No additional management support is needed unless otherwise documented below in the visit note. 

## 2015-08-20 NOTE — Assessment & Plan Note (Signed)
Encouraged patient to continue to work on ergonomics at work as well as the home exercises. We discussed with her to alleviate some more stress as well. Patient is unaware more on core in the upper back strengthening that I think be beneficial. Patient is having some muscle spasm denies little nail and will try a muscle relaxer regularly for 3 days. Patient and will come back and see me again in 6-7 weeks for further evaluation and treatment.

## 2015-10-05 NOTE — Assessment & Plan Note (Signed)
Decision today to treat with OMT was based on Physical Exam  After verbal consent patient was treated with HVLa and ME techniques in cervical, thoracic,rib   lumbar and sacrum areas, discussed other exercises as well.   Patient tolerated the procedure well with improvement in symptoms  Patient given exercises, stretches and lifestyle modifications  See medications in patient instructions if given  Patient will follow up in 7-8 weeks

## 2015-10-05 NOTE — Assessment & Plan Note (Addendum)
[facility]  Conservative therapy. When patient does he exercises more regularly she seems to get better as well. Patient increases her activity and does not give a stretching is seems to be exacerbating problem. Encourage patient to continue to work on core strengthening. Patient will follow-up again in 4 weeks.

## 2015-10-05 NOTE — Progress Notes (Signed)
  CC: Patient is a 50 year old female followup for neck pain  HPI: Patient was seen previously for neck pain. Has responded fairly well to conservative therapy for quite some time. Mild increase in tightness of neck and upper back due to coughing from asthma and allergies.  No longer moving boxes on a regular basis because she did so the house. Denies any other new symptoms just worsening of previous symptoms.     Past Medical History:  Diagnosis Date  . Asthma with allergic rhinitis   . Elevated liver enzymes   . Liver disease    Fatty liver  . Migraines   . Polycystic ovaries 1984  . Sleep apnea syndrome   . Subclinical hypothyroidism    Past Surgical History:  Procedure Laterality Date  . Laproscopy  2011  . LASER ABLATION  2012  . LITHOTRIPSY  2000   Social History  Substance Use Topics  . Smoking status: Never Smoker  . Smokeless tobacco: Never Used  . Alcohol use No   Allergies  Allergen Reactions  . Relafen [Nabumetone] Hives, Itching and Swelling  . Sulfa Antibiotics    Family History  Problem Relation Age of Onset  . Alcohol abuse Father   . Alcohol abuse Brother   . Coronary artery disease Maternal Grandmother   . Transient ischemic attack Maternal Grandmother   . Coronary artery disease Maternal Grandfather   . Heart attack Maternal Grandfather   . Transient ischemic attack Paternal Grandfather      Past medical, surgical, family and social history reviewed. Medications reviewed all in the electronic medical record.   Review of Systems: No headache, visual changes, nausea, vomiting, diarrhea, constipation, dizziness, abdominal pain, skin rash, fevers, chills, night sweats, weight loss, swollen lymph nodes, body aches, joint swelling, muscle aches, chest pain, shortness of breath, mood changes.   Objective:    Blood pressure 108/80, pulse 93, height 5\' 4"  (1.626 m), weight 183 lb (83 kg), SpO2 98 %.   General: No apparent distress alert and oriented  x3 mood and affect normal, dressed appropriately.  HEENT: Pupils equal, extraocular movements intact Respiratory: Patient's speak in full sentences and does not appear short of breath Cardiovascular: No lower extremity edema, non tender, no erythema Skin: Warm dry intact with no signs of infection or rash on extremities or on axial skeleton. Abdomen: Soft nontender Neuro: Cranial nerves II through XII are intact, neurovascularly intact in all extremities with 2+ DTRs and 2+ pulses. Lymph: No lymphadenopathy of posterior or anterior cervical chain or axillae bilaterally.  Gait normal with good balance and coordination.  MSK: Non tender with full range of motion and good stability and symmetric strength and tone of , shoulders, wrist, hip, knee and ankles bilaterally.  Neck: Inspection unremarkable. No palpable stepoffs. Negative Spurling's maneuver. Still lacking the last 5 of rotation and side bending bilaterally, mild increasing tightness from previous exam Grip strength and sensation normal in bilateral hands Strength good C4 to T1 distribution No sensory change to C4 to T1 Negative Hoffman sign bilaterally Reflexes normal  OMT Physical Exam  Cervical  C2 flexed rotated and side bent left C4 flexed rotated and side bent right T3 E RS left with elevated rib T7 extended rotated and side bent left L2 flexed rotated and side bent right Sacrum left on left   Impression and Recommendations:     This case required medical decision making of moderate complexity.

## 2015-10-06 ENCOUNTER — Encounter: Payer: Self-pay | Admitting: Family Medicine

## 2015-10-06 ENCOUNTER — Ambulatory Visit (INDEPENDENT_AMBULATORY_CARE_PROVIDER_SITE_OTHER): Payer: BLUE CROSS/BLUE SHIELD | Admitting: Family Medicine

## 2015-10-06 VITALS — BP 108/80 | HR 93 | Ht 64.0 in | Wt 183.0 lb

## 2015-10-06 DIAGNOSIS — M9908 Segmental and somatic dysfunction of rib cage: Secondary | ICD-10-CM | POA: Diagnosis not present

## 2015-10-06 DIAGNOSIS — M9902 Segmental and somatic dysfunction of thoracic region: Secondary | ICD-10-CM

## 2015-10-06 DIAGNOSIS — M7601 Gluteal tendinitis, right hip: Secondary | ICD-10-CM

## 2015-10-06 DIAGNOSIS — M545 Low back pain, unspecified: Secondary | ICD-10-CM

## 2015-10-06 DIAGNOSIS — M542 Cervicalgia: Secondary | ICD-10-CM

## 2015-10-06 DIAGNOSIS — M999 Biomechanical lesion, unspecified: Secondary | ICD-10-CM

## 2015-10-06 DIAGNOSIS — M9903 Segmental and somatic dysfunction of lumbar region: Secondary | ICD-10-CM

## 2015-10-06 DIAGNOSIS — M9901 Segmental and somatic dysfunction of cervical region: Secondary | ICD-10-CM

## 2015-10-06 NOTE — Assessment & Plan Note (Signed)
I do believe that this is more multifactorial. We discussed icing regimen, home exercises, we discussed objective is to avoid. Patient continue with conservative therapy. Follow-up again in 4-6 weeks.

## 2015-10-06 NOTE — Assessment & Plan Note (Signed)
Continues to have some discomfort. We discussed the possibility of injection which patient declined. Not doing exercises regularly. Encourage her to do this.

## 2015-10-06 NOTE — Patient Instructions (Signed)
God to see you  Ice is your friend still- crazy right ?!?! I would take the muscle relaxer regularly for next 3 nights.  Sorry for the coughing.  Try to keep working on your hip abductors.  The shoes are key.  Try now to take a little more time for yourself with the house out of the way.  See me again in 4 weeks.

## 2015-10-12 ENCOUNTER — Encounter: Payer: Self-pay | Admitting: Family Medicine

## 2015-10-26 ENCOUNTER — Encounter: Payer: Self-pay | Admitting: Family Medicine

## 2015-10-29 NOTE — Progress Notes (Signed)
CC: Patient is a 50 year old female followup for neck pain  HPI: Patient was seen previously for neck pain. Has responded fairly well to conservative therapy for quite some time. Mild increase in tightness of neck and upper back due to coughing from asthma and allergies.  No longer moving boxes on a regular basis because she did so the house. Denies any other new symptoms just worsening of previous symptoms.  Patient is also having more of a muscle cramping. Seems to be only in the right hand at the moment. Has had it intermittently over the course of years. Patient has had workup by other providers. An states that she has never been diagnosed with anything specifically. Patient did bring labs that did show an elevated ferritin level but otherwise unremarkable from an outside facility greater than a year ago. Patient is concerned though because it is affecting her work as well as her livelihood. **Patient does give history of scopolamine patch worsening symptoms as well as when going lower on Topamax for her migraines.**  Past Medical History:  Diagnosis Date  . Asthma with allergic rhinitis   . Elevated liver enzymes   . Liver disease    Fatty liver  . Migraines   . Polycystic ovaries 1984  . Sleep apnea syndrome   . Subclinical hypothyroidism    Past Surgical History:  Procedure Laterality Date  . Laproscopy  2011  . LASER ABLATION  2012  . LITHOTRIPSY  2000   Social History  Substance Use Topics  . Smoking status: Never Smoker  . Smokeless tobacco: Never Used  . Alcohol use No   Allergies  Allergen Reactions  . Relafen [Nabumetone] Hives, Itching and Swelling  . Sulfa Antibiotics    Family History  Problem Relation Age of Onset  . Alcohol abuse Father   . Alcohol abuse Brother   . Coronary artery disease Maternal Grandmother   . Transient ischemic attack Maternal Grandmother   . Coronary artery disease Maternal Grandfather   . Heart attack Maternal Grandfather   .  Transient ischemic attack Paternal Grandfather      Past medical, surgical, family and social history reviewed. Medications reviewed all in the electronic medical record.   Review of Systems: No headache, visual changes, nausea, vomiting, diarrhea, constipation, dizziness, abdominal pain, skin rash, fevers, chills, night sweats, weight loss, swollen lymph nodes, body aches, joint swelling, muscle aches, chest pain, shortness of breath, mood changes.   Objective:    Blood pressure 110/82, pulse 81, weight 183 lb (83 kg), SpO2 97 %.   General: No apparent distress alert and oriented x3 mood and affect normal, dressed appropriately.  HEENT: Pupils equal, extraocular movements intact Respiratory: Patient's speak in full sentences and does not appear short of breath Cardiovascular: No lower extremity edema, non tender, no erythema Skin: Warm dry intact with no signs of infection or rash on extremities or on axial skeleton. Abdomen: Soft nontender Neuro: Cranial nerves II through XII are intact, neurovascularly intact in all extremities with 2+ DTRs and 2+ pulses. Lymph: No lymphadenopathy of posterior or anterior cervical chain or axillae bilaterally.  Gait normal with good balance and coordination.  MSK: Non tender with full range of motion and good stability and symmetric strength and tone of , shoulders, wrist, hip, knee and ankles bilaterally.  Neck: Inspection unremarkable. No palpable stepoffs. Negative Spurling's maneuver. Still lacking the last 5 of rotation and side bending bilaterally, mild increasing tightness from previous exam Grip strength and sensation normal in bilateral  hands Strength good C4 to T1 distribution No sensory change to C4 to T1 Negative Hoffman sign bilaterally Reflexes normal No fasciculations noted today.  OMT Physical Exam  Cervical  C2 flexed rotated and side bent left C6 flexed rotated and side bent right T3 E RS left with inhaled rib T10  extended rotated and side bent left L2 flexed rotated and side bent right Sacrum left on left   Impression and Recommendations:     This case required medical decision making of moderate complexity.

## 2015-10-30 ENCOUNTER — Encounter: Payer: Self-pay | Admitting: Family Medicine

## 2015-10-30 ENCOUNTER — Ambulatory Visit (INDEPENDENT_AMBULATORY_CARE_PROVIDER_SITE_OTHER): Payer: BLUE CROSS/BLUE SHIELD | Admitting: Family Medicine

## 2015-10-30 ENCOUNTER — Other Ambulatory Visit (INDEPENDENT_AMBULATORY_CARE_PROVIDER_SITE_OTHER): Payer: BLUE CROSS/BLUE SHIELD

## 2015-10-30 VITALS — BP 110/82 | HR 81 | Wt 183.0 lb

## 2015-10-30 DIAGNOSIS — M9908 Segmental and somatic dysfunction of rib cage: Secondary | ICD-10-CM

## 2015-10-30 DIAGNOSIS — M9901 Segmental and somatic dysfunction of cervical region: Secondary | ICD-10-CM

## 2015-10-30 DIAGNOSIS — M999 Biomechanical lesion, unspecified: Secondary | ICD-10-CM

## 2015-10-30 DIAGNOSIS — M542 Cervicalgia: Secondary | ICD-10-CM

## 2015-10-30 DIAGNOSIS — R252 Cramp and spasm: Secondary | ICD-10-CM | POA: Diagnosis not present

## 2015-10-30 DIAGNOSIS — R253 Fasciculation: Secondary | ICD-10-CM

## 2015-10-30 DIAGNOSIS — M9903 Segmental and somatic dysfunction of lumbar region: Secondary | ICD-10-CM

## 2015-10-30 LAB — CBC WITH DIFFERENTIAL/PLATELET
BASOS ABS: 0 10*3/uL (ref 0.0–0.1)
Basophils Relative: 0.5 % (ref 0.0–3.0)
EOS ABS: 0.1 10*3/uL (ref 0.0–0.7)
EOS PCT: 3 % (ref 0.0–5.0)
HEMATOCRIT: 42.8 % (ref 36.0–46.0)
HEMOGLOBIN: 14.9 g/dL (ref 12.0–15.0)
LYMPHS ABS: 1.3 10*3/uL (ref 0.7–4.0)
LYMPHS PCT: 26.1 % (ref 12.0–46.0)
MCHC: 34.7 g/dL (ref 30.0–36.0)
MCV: 88.6 fl (ref 78.0–100.0)
MONOS PCT: 8.1 % (ref 3.0–12.0)
Monocytes Absolute: 0.4 10*3/uL (ref 0.1–1.0)
Neutro Abs: 3.1 10*3/uL (ref 1.4–7.7)
Neutrophils Relative %: 62.3 % (ref 43.0–77.0)
Platelets: 294 10*3/uL (ref 150.0–400.0)
RBC: 4.83 Mil/uL (ref 3.87–5.11)
RDW: 12.8 % (ref 11.5–15.5)
WBC: 4.9 10*3/uL (ref 4.0–10.5)

## 2015-10-30 LAB — IRON: Iron: 85 ug/dL (ref 42–145)

## 2015-10-30 LAB — TSH: TSH: 0.07 u[IU]/mL — AB (ref 0.35–4.50)

## 2015-10-30 LAB — SEDIMENTATION RATE: SED RATE: 35 mm/h — AB (ref 0–20)

## 2015-10-30 LAB — FERRITIN: FERRITIN: 168.7 ng/mL (ref 10.0–291.0)

## 2015-10-30 NOTE — Assessment & Plan Note (Signed)
Discussed with patient in great length. Patient states that she has had workup for this previously. We will check thyroid, parathyroid, iron and other labs a could be contribute in. We also discussed with patient watching stress and keep a journal to see if there is any triggering. If patient has any signs or symptoms of a movement disorder possible referral to neurology will be needed. Patient is in agreement with this plan.  **Patient does give history of scopolamine patch worsening symptoms as well as when going lower on Topamax for her migraines.**

## 2015-10-30 NOTE — Patient Instructions (Signed)
Good to see you  We will get labs today  Keep active and keep journal on when the cramping occurs.  See if there is any trigger If it worsens or no answer with the labs then lets consider seeing Dr. Arbutus Leasat See me again for manipulation in 4 weeks.

## 2015-10-30 NOTE — Progress Notes (Signed)
goo

## 2015-10-30 NOTE — Assessment & Plan Note (Signed)
Patient does have some neck pain. Describes it as a dull throbbing aching sensation. I do not think that this is highly concerning at the moment. Has responded well to osteopathic manipulation. Once again discussed ergonomics doing the exercises on a more regular basis. Questionable underlying pathology that could be contributing and labs ordered today. Follow-up again in 4 weeks.

## 2015-10-30 NOTE — Assessment & Plan Note (Signed)
Decision today to treat with OMT was based on Physical Exam  After verbal consent patient was treated with HVLa and ME techniques in cervical, thoracic,rib   lumbar and sacrum areas, discussed other exercises as well.   Patient tolerated the procedure well with improvement in symptoms  Patient given exercises, stretches and lifestyle modifications  See medications in patient instructions if given  Patient will follow up in 4 weeks                

## 2015-10-31 LAB — CALCIUM, IONIZED: CALCIUM ION: 5.2 mg/dL (ref 4.8–5.6)

## 2015-11-02 LAB — ANA: ANA: NEGATIVE

## 2015-11-02 LAB — PTH, INTACT AND CALCIUM
CALCIUM: 9.5 mg/dL (ref 8.4–10.5)
PTH: 25 pg/mL (ref 14–64)

## 2015-11-03 ENCOUNTER — Ambulatory Visit: Payer: Self-pay | Admitting: Family Medicine

## 2015-12-01 ENCOUNTER — Ambulatory Visit: Payer: BLUE CROSS/BLUE SHIELD | Admitting: Family Medicine

## 2015-12-08 ENCOUNTER — Encounter: Payer: Self-pay | Admitting: Family Medicine

## 2015-12-08 ENCOUNTER — Ambulatory Visit (INDEPENDENT_AMBULATORY_CARE_PROVIDER_SITE_OTHER): Payer: BLUE CROSS/BLUE SHIELD | Admitting: Family Medicine

## 2015-12-08 VITALS — BP 130/82 | HR 77 | Wt 185.0 lb

## 2015-12-08 DIAGNOSIS — M542 Cervicalgia: Secondary | ICD-10-CM | POA: Diagnosis not present

## 2015-12-08 DIAGNOSIS — M999 Biomechanical lesion, unspecified: Secondary | ICD-10-CM

## 2015-12-08 NOTE — Assessment & Plan Note (Signed)
I do believe that this is multifactorial. Patient recently has had some weight gain, we discussed icing, home exercises in which activities doing which ones to avoid. We discussed which activities doing which ones to avoid. Patient will come back and see me again in 3 weeks.

## 2015-12-08 NOTE — Progress Notes (Signed)
go CC: Patient is a 50 year old female followup for neck pain  HPI: Patient was seen previously for neck pain. Has responded fairly well to conservative therapy for quite some time. Mild increase in tightness of neck and upper back.  Patient recently did have cellulitis and family went away after antibiotics. Patient denies any numbness. Patient states she is having more pains and she was also on the prednisone. Had an allergic reaction to the antibiotics. Patient states since then she has gained weight and feels that has exacerbated some of the problem.  Patient is also having more of a muscle cramping. S still there. No significant improvement. When patient" on her Benadryl secondary to itching from the allergic reaction she notices the fasciculations that worsening. Very similar to previous history of a scopolamine patch worsening symptoms.  Past Medical History:  Diagnosis Date  . Asthma with allergic rhinitis   . Elevated liver enzymes   . Liver disease    Fatty liver  . Migraines   . Polycystic ovaries 1984  . Sleep apnea syndrome   . Subclinical hypothyroidism    Past Surgical History:  Procedure Laterality Date  . Laproscopy  2011  . LASER ABLATION  2012  . LITHOTRIPSY  2000   Social History  Substance Use Topics  . Smoking status: Never Smoker  . Smokeless tobacco: Never Used  . Alcohol use No   Allergies  Allergen Reactions  . Relafen [Nabumetone] Hives, Itching and Swelling  . Sulfa Antibiotics   . Clindamycin/Lincomycin Rash    Oral ONLY   Family History  Problem Relation Age of Onset  . Alcohol abuse Father   . Alcohol abuse Brother   . Coronary artery disease Maternal Grandmother   . Transient ischemic attack Maternal Grandmother   . Coronary artery disease Maternal Grandfather   . Heart attack Maternal Grandfather   . Transient ischemic attack Paternal Grandfather      Past medical, surgical, family and social history reviewed. Medications reviewed all  in the electronic medical record.   Review of Systems: No headache, visual changes, nausea, vomiting, diarrhea, constipation, dizziness, abdominal pain, skin rash, fevers, chills, night sweats, weight loss, swollen lymph nodes, body aches, joint swelling, muscle aches, chest pain, shortness of breath, mood changes.   Objective:    Blood pressure 130/82, pulse 77, weight 185 lb (83.9 kg), SpO2 99 %.   General: No apparent distress alert and oriented x3 mood and affect normal, dressed appropriately.  HEENT: Pupils equal, extraocular movements intact Respiratory: Patient's speak in full sentences and does not appear short of breath Cardiovascular: No lower extremity edema, non tender, no erythema Skin: Warm dry intact with no signs of infection or rash on extremities or on axial skeleton. Abdomen: Soft nontender Neuro: Cranial nerves II through XII are intact, neurovascularly intact in all extremities with 2+ DTRs and 2+ pulses. Lymph: No lymphadenopathy of posterior or anterior cervical chain or axillae bilaterally.  Gait normal with good balance and coordination.  MSK: Non tender with full range of motion and good stability and symmetric strength and tone of , shoulders, wrist, hip, knee and ankles bilaterally.  Neck: Inspection unremarkable. No palpable stepoffs. Negative Spurling's maneuver. Still lacking the last 5 of rotation and side bending bilaterally, Increased tenderness from previous exam lacking the last 10 of extension as well as. Her tenderness of the paraspinal musculature as well. Grip strength and sensation normal in bilateral hands Strength good C4 to T1 distribution No sensory change to C4 to  T1 Negative Hoffman sign bilaterally Reflexes normal No fasciculations noted today.  OMT Physical Exam  Cervical  C2 flexed rotated and side bent left C5 flexed rotated and side bent right T3 E RS left with inhaled rib T9 extended rotated and side bent left L2 flexed  rotated and side bent right Sacrum left on left   Impression and Recommendations:     This case required medical decision making of moderate complexity.

## 2015-12-08 NOTE — Assessment & Plan Note (Signed)
Decision today to treat with OMT was based on Physical Exam  After verbal consent patient was treated with HVLa and ME techniques in cervical, thoracic,rib   lumbar and sacrum areas, discussed other exercises as well.   Patient tolerated the procedure well with improvement in symptoms  Patient given exercises, stretches and lifestyle modifications  See medications in patient instructions if given  Patient will follow up in 3 weeks

## 2015-12-08 NOTE — Patient Instructions (Signed)
Good to see you  I am sorry for all that happened DHEA 50 mg daily for next 2 weeks to help hormones.  Keep trucking a long.  I like the leaf blower but consider a strap  See me again in 3 weeks.

## 2015-12-27 NOTE — Progress Notes (Signed)
go CC: Patient is a 50 year old female followup for neck pain  HPI: Patient was seen previously for neck pain. Has responded fairly well to conservative therapy for quite some time. Mild increase in tightness of neck and upper back.  Patient was treated for cellulitis previously and that seem to exacerbate everything. Patient states now she continues to have increasing pain as well as headaches. Patient states that this is no lower near her baseline still having difficulty.  Patient is also having more of a muscle cramping. Still there, decline neurology at this time.   Past Medical History:  Diagnosis Date  . Asthma with allergic rhinitis   . Elevated liver enzymes   . Liver disease    Fatty liver  . Migraines   . Polycystic ovaries 1984  . Sleep apnea syndrome   . Subclinical hypothyroidism    Past Surgical History:  Procedure Laterality Date  . Laproscopy  2011  . LASER ABLATION  2012  . LITHOTRIPSY  2000   Social History  Substance Use Topics  . Smoking status: Never Smoker  . Smokeless tobacco: Never Used  . Alcohol use No   Allergies  Allergen Reactions  . Relafen [Nabumetone] Hives, Itching and Swelling  . Sulfa Antibiotics   . Clindamycin/Lincomycin Rash    Oral ONLY   Family History  Problem Relation Age of Onset  . Alcohol abuse Father   . Alcohol abuse Brother   . Coronary artery disease Maternal Grandmother   . Transient ischemic attack Maternal Grandmother   . Coronary artery disease Maternal Grandfather   . Heart attack Maternal Grandfather   . Transient ischemic attack Paternal Grandfather      Past medical, surgical, family and social history reviewed. Medications reviewed all in the electronic medical record.   Review of Systems: No headache, visual changes, nausea, vomiting, diarrhea, constipation, dizziness, abdominal pain, skin rash, fevers, chills, night sweats, weight loss, swollen lymph nodes, body aches, joint swelling, muscle aches, chest  pain, shortness of breath, mood changes.   Objective:    Blood pressure 114/80, pulse 90, weight 184 lb (83.5 kg), SpO2 98 %.   General: No apparent distress alert and oriented x3 mood and affect normal, dressed appropriately.  HEENT: Pupils equal, extraocular movements intact Respiratory: Patient's speak in full sentences and does not appear short of breath Cardiovascular: No lower extremity edema, non tender, no erythema Skin: Warm dry intact with no signs of infection or rash on extremities or on axial skeleton. Abdomen: Soft nontender Neuro: Cranial nerves II through XII are intact, neurovascularly intact in all extremities with 2+ DTRs and 2+ pulses. Lymph: No lymphadenopathy of posterior or anterior cervical chain or axillae bilaterally.  Gait normal with good balance and coordination.  MSK: Non tender with full range of motion and good stability and symmetric strength and tone of , shoulders, wrist, hip, knee and ankles bilaterally.  Neck: Inspection unremarkable. No palpable stepoffs. Negative Spurling's maneuver. Still lacking the last 5 of rotation and side bending bilaterally, Increased tenderness from previous exam lacking the last 10 of extension as well as. Increasing tightness of paraspinal musculature again today and scalenes.  Grip strength and sensation normal in bilateral hands Strength good C4 to T1 distribution No sensory change to C4 to T1 Negative Hoffman sign bilaterally Reflexes normal  OMT Physical Exam  Cervical  C2 flexed rotated and side bent left C6 flexed rotated and side bent right T3 E RS left with inhaled rib still present.  T9 extended  rotated and side bent left L2 flexed rotated and side bent right Sacrum left on left   Impression and Recommendations:     This case required medical decision making of moderate complexity.

## 2015-12-27 NOTE — Assessment & Plan Note (Signed)
Decision today to treat with OMT was based on Physical Exam  After verbal consent patient was treated with HVLa and ME techniques in cervical, thoracic,rib   lumbar and sacrum areas, discussed other exercises as well.   Patient tolerated the procedure well with improvement in symptoms  Patient given exercises, stretches and lifestyle modifications  See medications in patient instructions if given  Patient will follow up in 3-4 weeks                                            

## 2015-12-28 ENCOUNTER — Ambulatory Visit (INDEPENDENT_AMBULATORY_CARE_PROVIDER_SITE_OTHER): Payer: BLUE CROSS/BLUE SHIELD | Admitting: Family Medicine

## 2015-12-28 ENCOUNTER — Encounter: Payer: Self-pay | Admitting: Family Medicine

## 2015-12-28 VITALS — BP 114/80 | HR 90 | Wt 184.0 lb

## 2015-12-28 DIAGNOSIS — M999 Biomechanical lesion, unspecified: Secondary | ICD-10-CM | POA: Diagnosis not present

## 2015-12-28 MED ORDER — VITAMIN D (ERGOCALCIFEROL) 1.25 MG (50000 UNIT) PO CAPS: 50000.0000 [IU] | ORAL_CAPSULE | ORAL | 0 refills | Status: DC

## 2015-12-28 NOTE — Patient Instructions (Addendum)
Great to see you  Thanks for trusting me with your co-worker.  Back off on vitamin A to 1 pill 3 times a week.  Once weekly vitamin D for next 12 weeks.  Keep trucking along  See me again in 3 weeks.

## 2016-01-18 NOTE — Progress Notes (Signed)
go CC: Patient is a 50 year old female followup for neck pain  HPI: Patient was seen previously for neck pain. Has responded fairly well to conservative therapy for quite some time. Mild increase in tightness of neck and upper back. Recently attempted to decrease some vitamins.  Mild improvement but then had a bout of vertigo recently. This has caused her not to do the exercises and even trouble with some dfaily activities.  No headache, no other symptoms other than tightening of the muscles in the neck and upper back.  Worsening of previous symptoms.    Patient is also having more of a muscle cramping. Still there, decline neurology at this time.    Past Medical History:  Diagnosis Date  . Asthma with allergic rhinitis   . Elevated liver enzymes   . Liver disease    Fatty liver  . Migraines   . Polycystic ovaries 1984  . Sleep apnea syndrome   . Subclinical hypothyroidism    Past Surgical History:  Procedure Laterality Date  . Laproscopy  2011  . LASER ABLATION  2012  . LITHOTRIPSY  2000   Social History  Substance Use Topics  . Smoking status: Never Smoker  . Smokeless tobacco: Never Used  . Alcohol use No   Allergies  Allergen Reactions  . Relafen [Nabumetone] Hives, Itching and Swelling  . Sulfa Antibiotics   . Clindamycin/Lincomycin Rash    Oral ONLY   Family History  Problem Relation Age of Onset  . Alcohol abuse Father   . Alcohol abuse Brother   . Coronary artery disease Maternal Grandmother   . Transient ischemic attack Maternal Grandmother   . Coronary artery disease Maternal Grandfather   . Heart attack Maternal Grandfather   . Transient ischemic attack Paternal Grandfather      Past medical, surgical, family and social history reviewed. Medications reviewed all in the electronic medical record.   Review of Systems: No headache, visual changes, nausea, vomiting, diarrhea, constipation, dizziness, abdominal pain, skin rash, fevers, chills, night sweats,  weight loss, swollen lymph nodes, body aches, joint swelling, muscle aches, chest pain, shortness of breath, mood changes.   Objective:    Blood pressure 124/84, pulse 82, height 5\' 4"  (1.626 m), weight 186 lb (84.4 kg), SpO2 98 %.   Systems examined below as of 01/19/16 General: NAD A&O x3 mood, affect normal  HEENT: Pupils equal, extraocular movements intact no nystagmus Respiratory: not short of breath at rest or with speaking Cardiovascular: No lower extremity edema, non tender Skin: Warm dry intact with no signs of infection or rash on extremities or on axial skeleton. Abdomen: Soft nontender, no masses Neuro: Cranial nerves  intact, neurovascularly intact in all extremities with 2+ DTRs and 2+ pulses. Lymph: No lymphadenopathy appreciated today  Gait normal with good balance and coordination.  MSK: Non tender with full range of motion and good stability and symmetric strength and tone of  elbows, wrist, shoulder knee hips and ankles bilaterally.  .  Neck: Inspection unremarkable. No palpable stepoffs. Negative Spurling's maneuver. Still mild decrease in ROM.  Increasing tightness of musculature paraspinal.  Grip strength and sensation normal in bilateral hands Strength good C4 to T1 distribution No sensory change to C4 to T1 Negative Hoffman sign bilaterally Reflexes normal  OMT Physical Exam  Cervical  C2 flexed rotated and side bent left C6 flexed rotated and side bent right T3 E RS left with inhaled rib still present.  T11 extended rotated and side bent left L2 flexed  rotated and side bent right Sacrum left on left   Impression and Recommendations:     This case required medical decision making of moderate complexity.

## 2016-01-19 ENCOUNTER — Ambulatory Visit (INDEPENDENT_AMBULATORY_CARE_PROVIDER_SITE_OTHER): Payer: BLUE CROSS/BLUE SHIELD | Admitting: Family Medicine

## 2016-01-19 ENCOUNTER — Encounter: Payer: Self-pay | Admitting: Family Medicine

## 2016-01-19 VITALS — BP 124/84 | HR 82 | Ht 64.0 in | Wt 186.0 lb

## 2016-01-19 DIAGNOSIS — M999 Biomechanical lesion, unspecified: Secondary | ICD-10-CM

## 2016-01-19 DIAGNOSIS — R42 Dizziness and giddiness: Secondary | ICD-10-CM

## 2016-01-19 DIAGNOSIS — M542 Cervicalgia: Secondary | ICD-10-CM

## 2016-01-19 MED ORDER — DIAZEPAM 5 MG PO TABS: 5.0000 mg | ORAL_TABLET | Freq: Two times a day (BID) | ORAL | 0 refills | Status: DC | PRN

## 2016-01-19 NOTE — Assessment & Plan Note (Signed)
Decision today to treat with OMT was based on Physical Exam  After verbal consent patient was treated with HVLa and ME techniques in cervical, thoracic,rib   lumbar and sacrum areas, discussed other exercises as well.   Patient tolerated the procedure well with improvement in symptoms  Patient given exercises, stretches and lifestyle modifications  See medications in patient instructions if given  Patient will follow up in 4-8 weeks

## 2016-01-19 NOTE — Assessment & Plan Note (Signed)
Given valium incase.

## 2016-01-19 NOTE — Assessment & Plan Note (Signed)
Still having tightness overall.  NO numbness.  No radiation  Will continue to monitor.  Does respond to OMT Continue current therapy of HEP, icing, posture and ergonomics.

## 2016-01-19 NOTE — Patient Instructions (Signed)
Good to see you  Julia [name] is your friend.  Valium up to 2 times a day for the vertigo.  Try the nose spray regularly for 2 weeks.  See me again in 3 weeks.  Happy holidays!

## 2016-02-08 NOTE — Progress Notes (Signed)
go CC: Patient is a 50 year old female followup for neck pain  HPI: Patient was seen previously for neck pain. Has been seen multiple times. Has responded fairly well to osteopathic manipulation. Patient states Overall she seems to be doing relatively well. Started having increasing tightness again because patient did unfortunately start raking leaves again. States that this is caused more upper back and neck discomfort. Patient still thinks he ergonomics at work also contribute to some of the discomfort. Patient's denies any radiation down the arms or any numbness or tingling. Patient states that the spasming that she was having has improved as well. Continues with the same medications and vitamins.   Past Medical History:  Diagnosis Date  . Asthma with allergic rhinitis   . Elevated liver enzymes   . Liver disease    Fatty liver  . Migraines   . Polycystic ovaries 1984  . Sleep apnea syndrome   . Subclinical hypothyroidism    Past Surgical History:  Procedure Laterality Date  . Laproscopy  2011  . LASER ABLATION  2012  . LITHOTRIPSY  2000   Social History  Substance Use Topics  . Smoking status: Never Smoker  . Smokeless tobacco: Never Used  . Alcohol use No   Allergies  Allergen Reactions  . Relafen [Nabumetone] Hives, Itching and Swelling  . Sulfa Antibiotics   . Clindamycin/Lincomycin Rash    Oral ONLY   Family History  Problem Relation Age of Onset  . Alcohol abuse Father   . Alcohol abuse Brother   . Coronary artery disease Maternal Grandmother   . Transient ischemic attack Maternal Grandmother   . Coronary artery disease Maternal Grandfather   . Heart attack Maternal Grandfather   . Transient ischemic attack Paternal Grandfather      Past medical, surgical, family and social history reviewed. Medications reviewed all in the electronic medical record.   Review of Systems: No headache, visual changes, nausea, vomiting, diarrhea, constipation, dizziness,  abdominal pain, skin rash, fevers, chills, night sweats, weight loss, swollen lymph nodes, body aches, joint swelling, muscle aches, chest pain, shortness of breath, mood changes.   Objective:    Blood pressure 112/80, pulse 85, height 5\' 4"  (1.626 m), weight 188 lb (85.3 kg).   Systems examined below as of 02/09/16 General: NAD A&O x3 mood, affect normal  HEENT: Pupils equal, extraocular movements intact no nystagmus Respiratory: not short of breath at rest or with speaking Cardiovascular: No lower extremity edema, non tender Skin: Warm dry intact with no signs of infection or rash on extremities or on axial skeleton. Abdomen: Soft nontender, no masses Neuro: Cranial nerves  intact, neurovascularly intact in all extremities with 2+ DTRs and 2+ pulses. Lymph: No lymphadenopathy appreciated today  Gait normal with good balance and coordination.  MSK: Non tender with full range of motion and good stability and symmetric strength and tone of  elbows, wrist, shoulder knee hips and ankles bilaterally. Very mild discomfort to palpation but throughout. .  Neck: Inspection unremarkable. No palpable stepoffs. Negative Spurling's maneuver. Mild increasing tightness of the right side of the neck lacking the last 10 of side bending and rotation Increasing tightness of musculature paraspinal.  Grip strength and sensation normal in bilateral hands Strength good C4 to T1 distribution No sensory change to C4 to T1 Negative Hoffman sign bilaterally Reflexes normal Mild increasing tightness of the hip flexors compared to previous exam.  OMT Physical Exam  Cervical  C2 flexed rotated and side bent left C5 flexed rotated  and side bent right T3 E RS left with inhaled rib   T8 extended rotated and side bent left L4 flexed rotated and side bent right Sacrum left on left   Impression and Recommendations:     This case required medical decision making of moderate complexity.

## 2016-02-09 ENCOUNTER — Ambulatory Visit (INDEPENDENT_AMBULATORY_CARE_PROVIDER_SITE_OTHER): Payer: BLUE CROSS/BLUE SHIELD | Admitting: Family Medicine

## 2016-02-09 ENCOUNTER — Encounter: Payer: Self-pay | Admitting: Family Medicine

## 2016-02-09 VITALS — BP 112/80 | HR 85 | Ht 64.0 in | Wt 188.0 lb

## 2016-02-09 DIAGNOSIS — M542 Cervicalgia: Secondary | ICD-10-CM

## 2016-02-09 DIAGNOSIS — M999 Biomechanical lesion, unspecified: Secondary | ICD-10-CM

## 2016-02-09 NOTE — Assessment & Plan Note (Signed)
Patient continues to have poor posture. I think patient has been fairly noncompliant on some of the exercises. We discussed ergonomics again. We discussed the possibility of avoiding repetitive activity. Patient is on a continued to be active otherwise. Follow-up again with me in 4-6 weeks.

## 2016-02-09 NOTE — Patient Instructions (Addendum)
Great to see you  Happy holidays!  I think you will do well overall.  Ice when you need it.  Stay active  See you again in 4 weeks.

## 2016-02-09 NOTE — Assessment & Plan Note (Signed)
Decision today to treat with OMT was based on Physical Exam  After verbal consent patient was treated with HVLa and ME techniques in cervical, thoracic,rib   lumbar and sacrum areas, discussed other exercises as well.   Patient tolerated the procedure well with improvement in symptoms  Patient given exercises, stretches and lifestyle modifications  See medications in patient instructions if given  Patient will follow up in 4-8 weeks                                                      

## 2016-03-08 NOTE — Progress Notes (Signed)
go CC: Patient is a 51 year old female followup for neck pain  HPI: Patient was seen previously for neck pain. Has been seen multiple times. Has responded fairly well to osteopathic manipulation. Continues to be active overall. We discussed icing regimen and home exercises. Patient has been doing them relatively well. Patient was to continue with conservative therapy. Continuing to have pain in the upper left shoulder region as well as the lower right back. Seems to be stable overall. Not having as much of the muscle spasms that she was having previously.   Past Medical History:  Diagnosis Date  . Asthma with allergic rhinitis   . Elevated liver enzymes   . Liver disease    Fatty liver  . Migraines   . Polycystic ovaries 1984  . Sleep apnea syndrome   . Subclinical hypothyroidism    Past Surgical History:  Procedure Laterality Date  . Laproscopy  2011  . LASER ABLATION  2012  . LITHOTRIPSY  2000   Social History  Substance Use Topics  . Smoking status: Never Smoker  . Smokeless tobacco: Never Used  . Alcohol use No   Allergies  Allergen Reactions  . Relafen [Nabumetone] Hives, Itching and Swelling  . Sulfa Antibiotics   . Clindamycin/Lincomycin Rash    Oral ONLY   Family History  Problem Relation Age of Onset  . Alcohol abuse Father   . Alcohol abuse Brother   . Coronary artery disease Maternal Grandmother   . Transient ischemic attack Maternal Grandmother   . Coronary artery disease Maternal Grandfather   . Heart attack Maternal Grandfather   . Transient ischemic attack Paternal Grandfather      Past medical, surgical, family and social history reviewed. Medications reviewed all in the electronic medical record.  Review of Systems: No headache, visual changes, nausea, vomiting, diarrhea, constipation, dizziness, abdominal pain, skin rash, fevers, chills, night sweats, weight loss, swollen lymph nodes, body aches, joint swelling, muscle aches, chest pain, shortness  of breath, mood changes.    Objective:    There were no vitals taken for this visit.   Systems examined below as of 03/09/16 General: NAD A&O x3 mood, affect normal  HEENT: Pupils equal, extraocular movements intact no nystagmus Respiratory: not short of breath at rest or with speaking Cardiovascular: No lower extremity edema, non tender Skin: Warm dry intact with no signs of infection or rash on extremities or on axial skeleton. Abdomen: Soft nontender, no masses Neuro: Cranial nerves  intact, neurovascularly intact in all extremities with 2+ DTRs and 2+ pulses. Lymph: No lymphadenopathy appreciated today  Gait normal with good balance and coordination.  MSK: Non tender with full range of motion and good stability and symmetric strength and tone of shoulders, elbows, wrist,  knee hips and ankles bilaterally.   Neck: Inspection . Mild increase in lordosis No palpable stepoffs. Negative Spurling's maneuver. Continue mild decrease in range of motion especially with flexion and extension. Increasing tightness of musculature paraspinal.  Grip strength and sensation normal in bilateral hands Strength good C4 to T1 distribution No sensory change to C4 to T1 Negative Hoffman sign bilaterally Reflexes normal Continued increasing pain from previous exam  Osteopathic findings Cervical C2 flexed rotated and side bent right C4 flexed rotated and side bent left C6 flexed rotated and side bent left T3 extended rotated and side bent right inhaled third rib T7 extended rotated and side bent left L2 flexed rotated and side bent right Sacrum right on right    Impression and  Recommendations:     This case required medical decision making of moderate complexity.

## 2016-03-09 ENCOUNTER — Ambulatory Visit (INDEPENDENT_AMBULATORY_CARE_PROVIDER_SITE_OTHER): Payer: BLUE CROSS/BLUE SHIELD | Admitting: Family Medicine

## 2016-03-09 ENCOUNTER — Encounter: Payer: Self-pay | Admitting: Family Medicine

## 2016-03-09 VITALS — BP 122/82 | HR 73 | Ht 64.0 in | Wt 185.0 lb

## 2016-03-09 DIAGNOSIS — M542 Cervicalgia: Secondary | ICD-10-CM | POA: Diagnosis not present

## 2016-03-09 DIAGNOSIS — M999 Biomechanical lesion, unspecified: Secondary | ICD-10-CM | POA: Diagnosis not present

## 2016-03-09 NOTE — Assessment & Plan Note (Signed)
Overall stable. No significant changes. Patient though has not made any significant improvement either. Patient has been fairly noncompliant with the exercises. We discussed posture and ergonomics are up-to-date again in greater detail. Response fairly well to osteopathic manipulation and we'll try 6 week intervals.

## 2016-03-09 NOTE — Assessment & Plan Note (Signed)
Decision today to treat with OMT was based on Physical Exam  After verbal consent patient was treated with HVLa and ME techniques in cervical, thoracic,rib   lumbar and sacrum areas, discussed other exercises as well.   Patient tolerated the procedure well with improvement in symptoms  Patient given exercises, stretches and lifestyle modifications  See medications in patient instructions if given  Patient will follow up in 6-8 weeks                        

## 2016-03-09 NOTE — Patient Instructions (Signed)
Good see you See me again in 6 weeks.  Happy New Year!  Posture, posture, posture

## 2016-03-12 ENCOUNTER — Encounter: Payer: Self-pay | Admitting: Family Medicine

## 2016-03-16 ENCOUNTER — Other Ambulatory Visit: Payer: Self-pay | Admitting: Family Medicine

## 2016-03-16 NOTE — Telephone Encounter (Signed)
Refill done.  

## 2016-04-20 ENCOUNTER — Encounter: Payer: Self-pay | Admitting: Family Medicine

## 2016-04-20 ENCOUNTER — Ambulatory Visit (INDEPENDENT_AMBULATORY_CARE_PROVIDER_SITE_OTHER): Payer: BLUE CROSS/BLUE SHIELD | Admitting: Family Medicine

## 2016-04-20 VITALS — BP 126/86 | HR 91 | Ht 64.0 in | Wt 186.0 lb

## 2016-04-20 DIAGNOSIS — M542 Cervicalgia: Secondary | ICD-10-CM | POA: Diagnosis not present

## 2016-04-20 DIAGNOSIS — M999 Biomechanical lesion, unspecified: Secondary | ICD-10-CM

## 2016-04-20 NOTE — Patient Instructions (Signed)
I am impressed  Keep it up  I think we need to push you to 7 weeks! Keep it up but try to limit the boxes ;)

## 2016-04-20 NOTE — Progress Notes (Signed)
go CC: Patient is a 51 year old female followup for neck pain  HPI: Patient was seen previously for neck pain. Has been seen multiple times. Has responded fairly well to osteopathic manipulation. Patient was doing relatively well but then started to increase activity. Recently did move a lot of boxes. States that that seem to aggravate the neck. Last week was having severe amount of pain that she almost called out of work. Patient states this week seems to be doing a little bit better. Patient did do a lot of home modalities including heat and ice protocols as well as even took an anti-inflammatory regularly for a couple days. Denies anything such as radiation of pain, still has intermittent migraines. No new symptoms just worsening of previous symptoms.   Past Medical History:  Diagnosis Date  . Asthma with allergic rhinitis   . Elevated liver enzymes   . Liver disease    Fatty liver  . Migraines   . Polycystic ovaries 1984  . Sleep apnea syndrome   . Subclinical hypothyroidism    Past Surgical History:  Procedure Laterality Date  . Laproscopy  2011  . LASER ABLATION  2012  . LITHOTRIPSY  2000   Social History  Substance Use Topics  . Smoking status: Never Smoker  . Smokeless tobacco: Never Used  . Alcohol use No   Allergies  Allergen Reactions  . Relafen [Nabumetone] Hives, Itching and Swelling  . Sulfa Antibiotics   . Clindamycin/Lincomycin Rash    Oral ONLY   Family History  Problem Relation Age of Onset  . Alcohol abuse Father   . Alcohol abuse Brother   . Coronary artery disease Maternal Grandmother   . Transient ischemic attack Maternal Grandmother   . Coronary artery disease Maternal Grandfather   . Heart attack Maternal Grandfather   . Transient ischemic attack Paternal Grandfather      Past medical, surgical, family and social history reviewed. Medications reviewed all in the electronic medical record.  Review of Systems: No visual changes, nausea,  vomiting, diarrhea, constipation,  abdominal pain, skin rash, fevers, chills, night sweats, weight loss, swollen lymph nodes, , chest pain, shortness of breath, mood changes.  Positive for muscle aches and body aches. Positive for headaches and migraines   Objective:    Blood pressure 126/86, pulse 91, height 5\' 4"  (1.626 m), weight 186 lb (84.4 kg), SpO2 98 %.   Systems examined below as of 04/20/16 General: NAD A&O x3 mood, affect normal  HEENT: Pupils equal, extraocular movements intact no nystagmus Respiratory: not short of breath at rest or with speaking Cardiovascular: No lower extremity edema, non tender Skin: Warm dry intact with no signs of infection or rash on extremities or on axial skeleton. Abdomen: Soft nontender, no masses Neuro: Cranial nerves  intact, neurovascularly intact in all extremities with 2+ DTRs and 2+ pulses. Lymph: No lymphadenopathy appreciated today  Gait normal with good balance and coordination.  MSK: Non tender with full range of motion and good stability and symmetric strength and tone of shoulders, elbows, wrist,  knee hips and ankles bilaterally.   Neck: Inspection . Mild increase in lordosis of cervical spine. Mild increasing kyphosis. Worsening posture. No palpable stepoffs. Negative Spurling's maneuver. Increasing stiffness of the neck from previous exam Lacking 5 in all planes Increasing tightness of musculature paraspinal.  Grip strength and sensation normal in bilateral hands Strength good C4 to T1 distribution No sensory change to C4 to T1 Negative Hoffman sign bilaterally Reflexes normal Mild worsening symptoms  from previous exam  Osteopathic findings Cervical C2 flexed rotated and side bent right C6 flexed rotated and side bent left T3 extended rotated and side bent right inhaled third rib T7 extended rotated and side bent left L4 flexed rotated and side bent right Sacrum right on right     Impression and Recommendations:      This case required medical decision making of moderate complexity.

## 2016-04-20 NOTE — Assessment & Plan Note (Signed)
Will be a chronic issue. Discussed with patient at great length. We discussed the importance of some more posture and ergonomics throughout the day. Decline formal physical therapy again. Has topical anti-inflammatory for breakthrough. Continues to respond fairly well to manipulation. Patient will continue to be active otherwise. Patient follow-up with me again in 7 weeks

## 2016-04-20 NOTE — Assessment & Plan Note (Signed)
Decision today to treat with OMT was based on Physical Exam  After verbal consent patient was treated with HVLA, ME, FPR techniques in cervical, thoracic, rib, lumbar and sacral areas  Patient tolerated the procedure well with improvement in symptoms  Patient given exercises, stretches and lifestyle modifications  See medications in patient instructions if given  Patient will follow up in 7 weeks 

## 2016-05-15 IMAGING — CR DG SHOULDER 2+V*L*
3 series · 3 of 3 positions shown · non-contrast
Comparison: None.

CLINICAL DATA: 40-year-old female with left shoulder pain for 8
months. Limited range of motion. No known injury.

EXAM:
LEFT SHOULDER - 2+ VIEW

[view not recorded (1 of 3)]
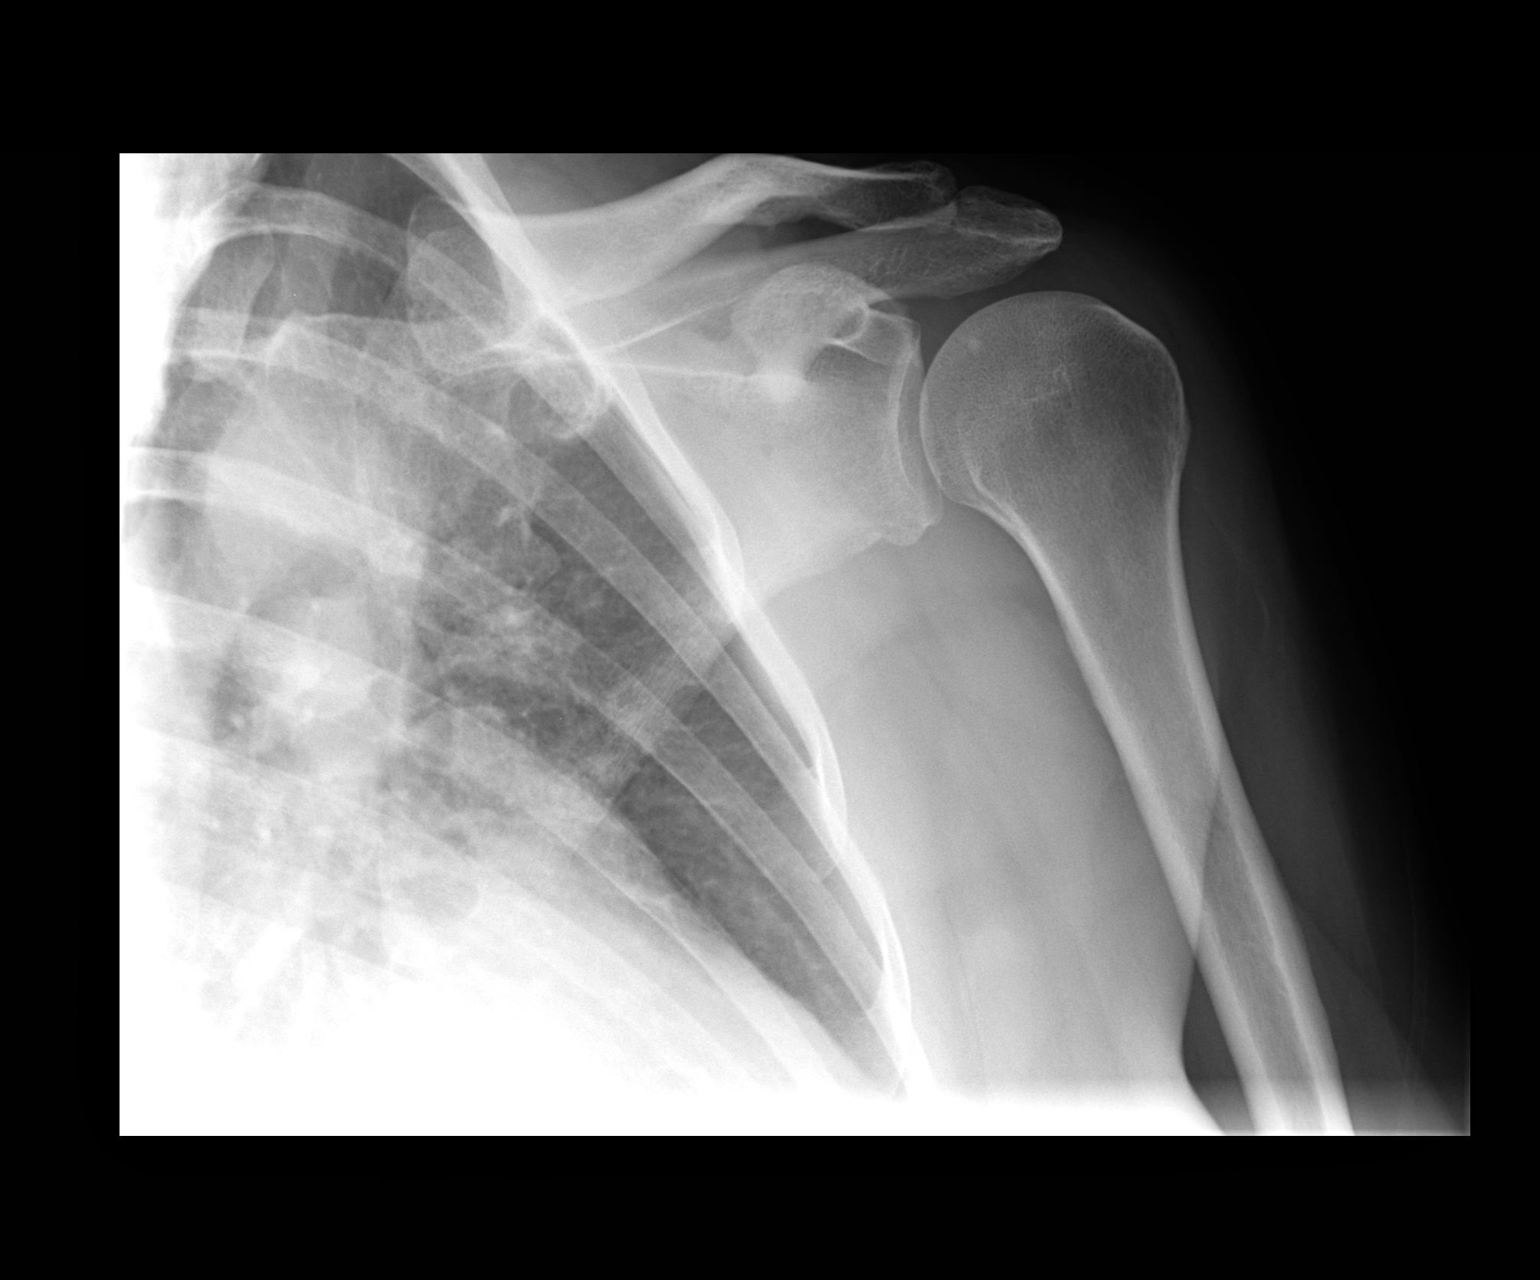

[view not recorded (2 of 3)]
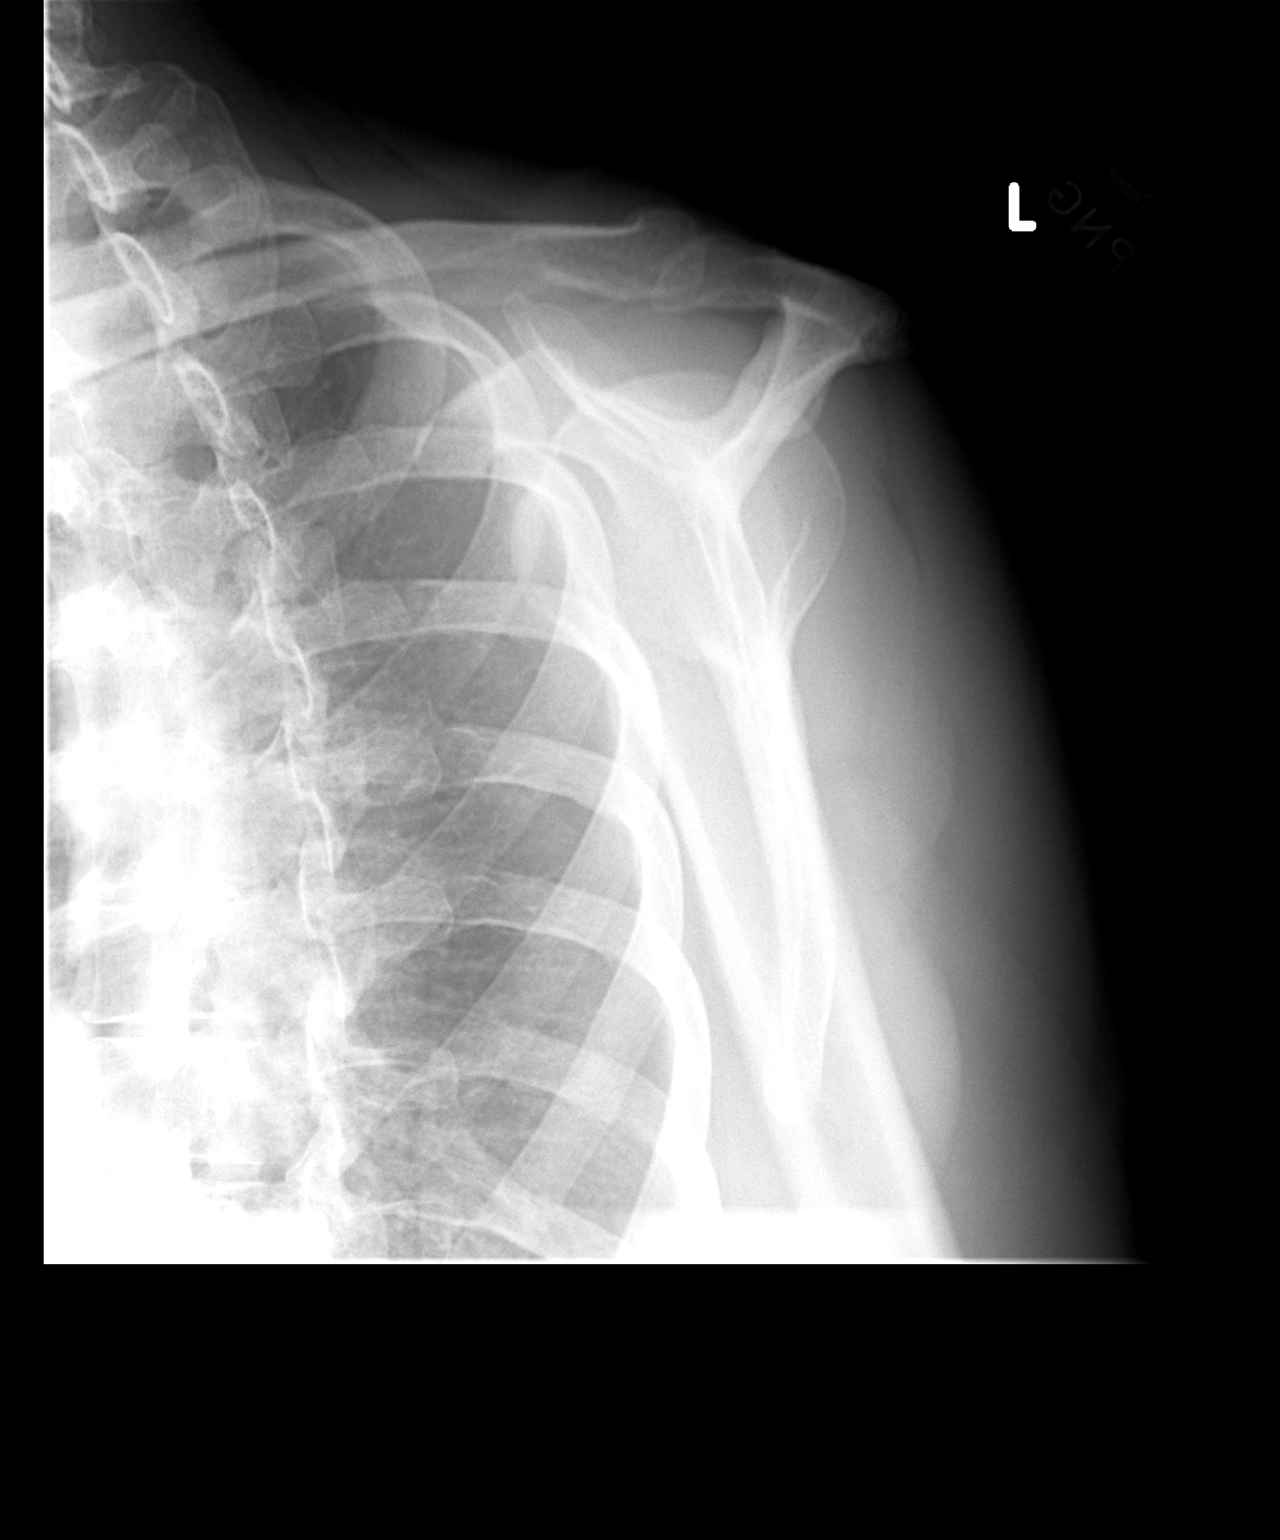

[view not recorded (3 of 3)]
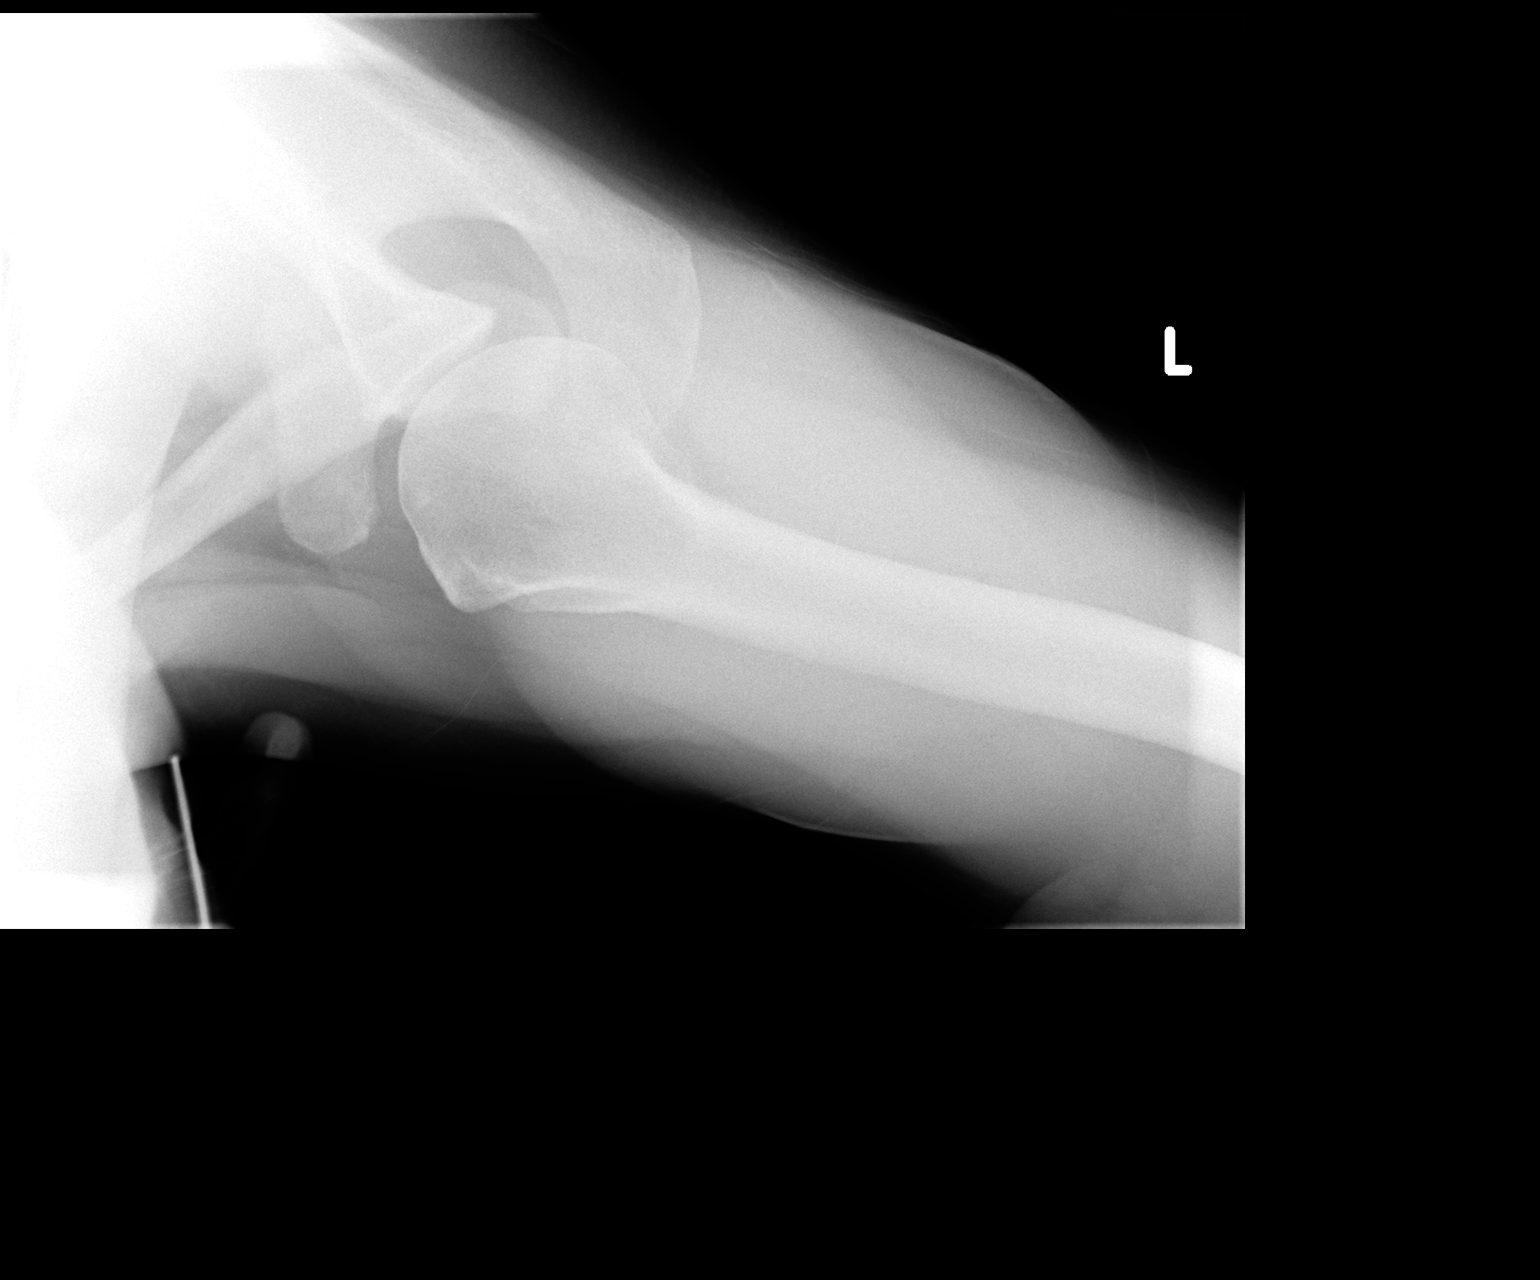

[3 of 3 positions shown; findings below may reference images not displayed]

FINDINGS: There is no evidence of fracture or dislocation. There is no
evidence of arthropathy or other focal bone abnormality. Soft
tissues are unremarkable.
IMPRESSION: Negative.

## 2016-06-09 ENCOUNTER — Ambulatory Visit (INDEPENDENT_AMBULATORY_CARE_PROVIDER_SITE_OTHER): Payer: BLUE CROSS/BLUE SHIELD | Admitting: Family Medicine

## 2016-06-09 ENCOUNTER — Encounter: Payer: Self-pay | Admitting: Family Medicine

## 2016-06-09 VITALS — BP 130/86 | HR 97 | Resp 16 | Wt <= 1120 oz

## 2016-06-09 DIAGNOSIS — M999 Biomechanical lesion, unspecified: Secondary | ICD-10-CM | POA: Diagnosis not present

## 2016-06-09 DIAGNOSIS — M542 Cervicalgia: Secondary | ICD-10-CM | POA: Diagnosis not present

## 2016-06-09 NOTE — Progress Notes (Signed)
Pre-visit discussion using our clinic review tool. No additional management support is needed unless otherwise documented below in the visit note.  

## 2016-06-09 NOTE — Progress Notes (Signed)
go CC: Patient is a 51 year old female followup for neck pain  HPI: Patient was seen previously for neck pain. Has been seen multiple times. Has responded fairly well to osteopathic manipulation. Patient has had some worsening reflux and has had to change her position when sleeping. Because that she is having increasing neck pain. Denies any radiation down the arms. States that she still has intermittent tingling but nothing severe. Patient has not worked for couple days and thinks that has been beneficial. Patient is returning to work Advertising account executive.   Past Medical History:  Diagnosis Date  . Asthma with allergic rhinitis   . Elevated liver enzymes   . Liver disease    Fatty liver  . Migraines   . Polycystic ovaries 1984  . Sleep apnea syndrome   . Subclinical hypothyroidism    Past Surgical History:  Procedure Laterality Date  . Laproscopy  2011  . LASER ABLATION  2012  . LITHOTRIPSY  2000   Social History  Substance Use Topics  . Smoking status: Never Smoker  . Smokeless tobacco: Never Used  . Alcohol use No   Allergies  Allergen Reactions  . Relafen [Nabumetone] Hives, Itching and Swelling  . Sulfa Antibiotics   . Clindamycin/Lincomycin Rash    Oral ONLY   Family History  Problem Relation Age of Onset  . Alcohol abuse Father   . Alcohol abuse Brother   . Coronary artery disease Maternal Grandmother   . Transient ischemic attack Maternal Grandmother   . Coronary artery disease Maternal Grandfather   . Heart attack Maternal Grandfather   . Transient ischemic attack Paternal Grandfather      Past medical, surgical, family and social history reviewed. Medications reviewed all in the electronic medical record.  Review of Systems: No visual changes, nausea, vomiting, diarrhea, constipation, dizziness, abdominal pain, skin rash, fevers, chills, night sweats, weight loss, swollen lymph nodes,, chest pain, shortness of breath, mood changes.   Positive for muscle aches, body  aches, headaches   Objective:    Blood pressure 130/86, pulse 97, resp. rate 16, weight 11 lb 14.2 oz (5.392 kg), SpO2 98 %.   Systems examined below as of 06/09/16 General: NAD A&O x3 mood, affect normal  HEENT: Pupils equal, extraocular movements intact no nystagmus Respiratory: not short of breath at rest or with speaking Cardiovascular: No lower extremity edema, non tender Skin: Warm dry intact with no signs of infection or rash on extremities or on axial skeleton. Abdomen: Soft nontender, no masses Neuro: Cranial nerves  intact, neurovascularly intact in all extremities with 2+ DTRs and 2+ pulses. Lymph: No lymphadenopathy appreciated today  Gait normal with good balance and coordination.  MSK: Non tender with full range of motion and good stability and symmetric strength and tone of shoulders, elbows, wrist,  knee hips and ankles bilaterally.    Neck: Inspection unremarkable. No palpable stepoffs. Negative Spurling's maneuver. Lacks last 10-15 of extension. Lacks last 10 of side bent and bilaterally Grip strength and sensation normal in bilateral hands Strength good C4 to T1 distribution No sensory change to C4 to T1 Negative Hoffman sign bilaterally Reflexes normal  Osteopathic findings Cervical C2 flexed rotated and side bent right C7 flexed rotated and side bent left T3 extended rotated and side bent right inhaled third rib T8 extended rotated and side bent left L2 flexed rotated and side bent right Sacrum right on right      Impression and Recommendations:     This case required medical decision making  of moderate complexity.

## 2016-06-09 NOTE — Assessment & Plan Note (Signed)
Decision today to treat with OMT was based on Physical Exam  After verbal consent patient was treated with HVLA, ME, FPR techniques in cervical, thoracic, rib lumbar and sacral areas  Patient tolerated the procedure well with improvement in symptoms  Patient given exercises, stretches and lifestyle modifications  See medications in patient instructions if given  Patient will follow up in 4-8 weeks 

## 2016-06-09 NOTE — Patient Instructions (Signed)
Good to see you  You are a little tight but I think you will do well.  Watch the posture I hope the pillow situation gets better See me again in 4-8 weeks.

## 2016-06-09 NOTE — Assessment & Plan Note (Signed)
Discussed with patient again at great length. Patient is to try to change sleeping position. We discussed other changes that could be beneficial. We'll continue to see her in 4-8 week intervals.

## 2016-06-22 ENCOUNTER — Other Ambulatory Visit: Payer: Self-pay | Admitting: Family Medicine

## 2016-08-04 ENCOUNTER — Ambulatory Visit (INDEPENDENT_AMBULATORY_CARE_PROVIDER_SITE_OTHER): Payer: BLUE CROSS/BLUE SHIELD | Admitting: Family Medicine

## 2016-08-04 ENCOUNTER — Encounter: Payer: Self-pay | Admitting: Family Medicine

## 2016-08-04 VITALS — BP 132/84 | HR 82 | Ht 64.0 in | Wt 189.0 lb

## 2016-08-04 DIAGNOSIS — M25562 Pain in left knee: Secondary | ICD-10-CM | POA: Insufficient documentation

## 2016-08-04 DIAGNOSIS — M542 Cervicalgia: Secondary | ICD-10-CM

## 2016-08-04 DIAGNOSIS — M999 Biomechanical lesion, unspecified: Secondary | ICD-10-CM | POA: Diagnosis not present

## 2016-08-04 MED ORDER — HYDROXYZINE HCL 25 MG PO TABS: 25.0000 mg | ORAL_TABLET | Freq: Three times a day (TID) | ORAL | 1 refills | Status: DC | PRN

## 2016-08-04 NOTE — Assessment & Plan Note (Signed)
Decision today to treat with OMT was based on Physical Exam  After verbal consent patient was treated with HVLA, ME, FPR techniques in cervical, thoracic, rib, lumbar and sacral areas  Patient tolerated the procedure well with improvement in symptoms  Patient given exercises, stretches and lifestyle modifications  See medications in patient instructions if given  Patient will follow up in 4 weeks 

## 2016-08-04 NOTE — Assessment & Plan Note (Signed)
I believe that is multifactorial. We discussed icing regimen and home exercises. Discuss again the importance of posture. Discussed the possibility of changing some ergonomics at work. Follow-up again in 4 weeks

## 2016-08-04 NOTE — Progress Notes (Signed)
go CC: Patient is a 51 year old female followup for neck pain  HPI: Patient was seen previously for neck pain. Has been seen multiple times. Has responded fairly well to osteopathic manipulation. Continues to have discomfort overall. Not doing exercises regularly. Does seem to be associated with her migraines. Having increasing headaches as well as increasing back pain recently. No radiation down the arm or any weakness.  Complaining left knee pain. Patient describes the pain as a dull, throbbing aching pain. Has been doing more and notices this. Hasn't same pain 1 year diagnosed with more of a patellofemoral syndrome. Chills and is similar with some mild swelling. Rates severity of 5 out of 10.   Past Medical History:  Diagnosis Date  . Asthma with allergic rhinitis   . Elevated liver enzymes   . Liver disease    Fatty liver  . Migraines   . Polycystic ovaries 1984  . Sleep apnea syndrome   . Subclinical hypothyroidism    Past Surgical History:  Procedure Laterality Date  . Laproscopy  2011  . LASER ABLATION  2012  . LITHOTRIPSY  2000   Social History  Substance Use Topics  . Smoking status: Never Smoker  . Smokeless tobacco: Never Used  . Alcohol use No   Allergies  Allergen Reactions  . Relafen [Nabumetone] Hives, Itching and Swelling  . Sulfa Antibiotics   . Clindamycin/Lincomycin Rash    Oral ONLY   Family History  Problem Relation Age of Onset  . Alcohol abuse Father   . Alcohol abuse Brother   . Coronary artery disease Maternal Grandmother   . Transient ischemic attack Maternal Grandmother   . Coronary artery disease Maternal Grandfather   . Heart attack Maternal Grandfather   . Transient ischemic attack Paternal Grandfather      Past medical, surgical, family and social history reviewed. Medications reviewed all in the electronic medical record.  Review of Systems: No  visual changes, nausea, vomiting, diarrhea, constipation, dizziness, abdominal pain, skin  rash, fevers, chills, night sweats, weight loss, swollen lymph nodes, body aches, joint swelling, chest pain, shortness of breath, mood changes.  Positive muscle aches and headaches   Objective:    Blood pressure 132/84, pulse 82, height 5\' 4"  (1.626 m), weight 189 lb (85.7 kg), SpO2 97 %.   Systems examined below as of 08/04/16 General: NAD A&O x3 mood, affect normal  HEENT: Pupils equal, extraocular movements intact no nystagmus Respiratory: not short of breath at rest or with speaking Cardiovascular: No lower extremity edema, non tender Skin: Warm dry intact with no signs of infection or rash on extremities or on axial skeleton. Abdomen: Soft nontender, no masses Neuro: Cranial nerves  intact, neurovascularly intact in all extremities with 2+ DTRs and 2+ pulses. Lymph: No lymphadenopathy appreciated today  Gait normal with good balance and coordination.  MSK: Non tender with full range of motion and good stability and symmetric strength and tone of shoulders, elbows, wrist,  hips and ankles bilaterally.    Neck: Inspection unremarkable. No palpable stepoffs. Negative Spurling's maneuver. Mild limitation lacking the last 5 of side bending bilaterally Grip strength and sensation normal in bilateral hands Strength good C4 to T1 distribution No sensory change to C4 to T1 Negative Hoffman sign bilaterally Reflexes normal  Osteopathic findings C2 flexed rotated and side bent right C7 flexed rotated and side bent left T3 extended rotated and side bent right inhaled third rib T11 extended rotated and side bent left L1 flexed rotated and side bent  right Sacrum left on left  Knee: Left Mild instability of the patella with a lateral tilt Patient is some discomfort over the superior patellofemoral area. ROM full in flexion and extension and lower leg rotation. Ligaments with solid consistent endpoints including ACL, PCL, LCL, MCL. Negative Mcmurray's, Apley's, and Thessalonian  tests. painful patellar compression. Patellar glide without crepitus. Patellar and quadriceps tendons unremarkable. Hamstring and quadriceps strength is normal.     Impression and Recommendations:     This case required medical decision making of moderate complexity.

## 2016-08-04 NOTE — Patient Instructions (Signed)
Great to see you  For the elbow try a compression sleeve daily for 2 weeks.  I think you should be fine  The knee wear the brace with mowing or your daughter could do it Exercises 3 times a week.  [employer] "total support" online would be great  We may need other custom orthotics and try changing shoes at work.  Refilled hydroxyzine.  See me again in 4-6 weeks.

## 2016-08-04 NOTE — Assessment & Plan Note (Signed)
Discussed with patient at great length. We discussed icing regimen and home exercises. Discussed which activities doing which ones to avoid. Patient given a true pull light likely will be helpful. Patient come back and see me again in 4-6 weeks. Worsening symptoms consider injection

## 2016-09-15 ENCOUNTER — Encounter: Payer: Self-pay | Admitting: Family Medicine

## 2016-09-15 ENCOUNTER — Ambulatory Visit (INDEPENDENT_AMBULATORY_CARE_PROVIDER_SITE_OTHER): Payer: BLUE CROSS/BLUE SHIELD | Admitting: Family Medicine

## 2016-09-15 VITALS — BP 110/82 | HR 70 | Ht 64.0 in | Wt 186.0 lb

## 2016-09-15 DIAGNOSIS — M999 Biomechanical lesion, unspecified: Secondary | ICD-10-CM

## 2016-09-15 DIAGNOSIS — M542 Cervicalgia: Secondary | ICD-10-CM

## 2016-09-15 NOTE — Patient Instructions (Signed)
You know the drill  See me again in 4-6 weeks

## 2016-09-15 NOTE — Progress Notes (Signed)
  CC: Patient is a 51 year old female followup for neck pain  HPI: Patient was seen previously for neck pain. Patient states that she has been moving her mom out of an apartment. Patient states that that has caused her to do a lot of heavy lifting. Causing some increased tightness. Patient denies any radiation down the arm or any numbness or tingling. Does increase her headaches. Has not been able to do her home exercises regularly.   Past Medical History:  Diagnosis Date  . Asthma with allergic rhinitis   . Elevated liver enzymes   . Liver disease    Fatty liver  . Migraines   . Polycystic ovaries 1984  . Sleep apnea syndrome   . Subclinical hypothyroidism    Past Surgical History:  Procedure Laterality Date  . Laproscopy  2011  . LASER ABLATION  2012  . LITHOTRIPSY  2000   Social History  Substance Use Topics  . Smoking status: Never Smoker  . Smokeless tobacco: Never Used  . Alcohol use No   Allergies  Allergen Reactions  . Relafen [Nabumetone] Hives, Itching and Swelling  . Sulfa Antibiotics   . Clindamycin/Lincomycin Rash    Oral ONLY   Family History  Problem Relation Age of Onset  . Alcohol abuse Father   . Alcohol abuse Brother   . Coronary artery disease Maternal Grandmother   . Transient ischemic attack Maternal Grandmother   . Coronary artery disease Maternal Grandfather   . Heart attack Maternal Grandfather   . Transient ischemic attack Paternal Grandfather      Past medical, surgical, family and social history reviewed. Medications reviewed all in the electronic medical record.  Review of Systems: No  visual changes, nausea, vomiting, diarrhea, constipation, dizziness, abdominal pain, skin rash, fevers, chills, night sweats, weight loss, swollen lymph nodes, body aches, joint swelling, chest pain, shortness of breath, mood changes.  Positive muscle aches and headaches   Objective:    There were no vitals taken for this visit.   Systems examined  below as of 09/15/16 General: NAD A&O x3 mood, affect normal  HEENT: Pupils equal, extraocular movements intact no nystagmus Respiratory: not short of breath at rest or with speaking Cardiovascular: No lower extremity edema, non tender Skin: Warm dry intact with no signs of infection or rash on extremities or on axial skeleton. Abdomen: Soft nontender, no masses Neuro: Cranial nerves  intact, neurovascularly intact in all extremities with 2+ DTRs and 2+ pulses. Lymph: No lymphadenopathy appreciated today  Gait normal with good balance and coordination.  MSK: Non tender with full range of motion and good stability and symmetric strength and tone of shoulders, elbows, wrist,  knee hips and ankles bilaterally.   Neck: Inspection unremarkable. No palpable stepoffs. Negative Spurling's maneuver. Still lacks last 5 of extension Grip strength and sensation normal in bilateral hands Strength good C4 to T1 distribution No sensory change to C4 to T1 Negative Hoffman sign bilaterally Reflexes normal  Osteopathic findings C2 flexed rotated and side bent right C4 flexed rotated and side bent left T3 extended rotated and side bent right inhaled third rib T6 extended rotated and side bent left L3 flexed rotated and side bent right Sacrum right on right  Impression and Recommendations:     This case required medical decision making of moderate complexity.

## 2016-09-15 NOTE — Assessment & Plan Note (Signed)
Decision today to treat with OMT was based on Physical Exam  After verbal consent patient was treated with HVLA, ME, FPR techniques in cervical, thoracic, rib lumbar and sacral areas  Patient tolerated the procedure well with improvement in symptoms  Patient given exercises, stretches and lifestyle modifications  See medications in patient instructions if given  Patient will follow up in 4-6 weeks 

## 2016-09-15 NOTE — Assessment & Plan Note (Signed)
Continues around the overall. Discussed with patient in great length. We discussed the possibility of formal physical therapy or advance imaging which patient declined again. We will continue with conservative therapy including osteopathic manipulation. Has different medications for breakthrough pain. Follow-up again with me in 4-6 weeks.

## 2016-09-17 ENCOUNTER — Other Ambulatory Visit: Payer: Self-pay | Admitting: Family Medicine

## 2016-10-19 ENCOUNTER — Ambulatory Visit (INDEPENDENT_AMBULATORY_CARE_PROVIDER_SITE_OTHER): Payer: BLUE CROSS/BLUE SHIELD | Admitting: Family Medicine

## 2016-10-19 ENCOUNTER — Encounter: Payer: Self-pay | Admitting: Family Medicine

## 2016-10-19 VITALS — BP 110/82 | HR 84 | Ht 64.0 in | Wt 188.0 lb

## 2016-10-19 DIAGNOSIS — M542 Cervicalgia: Secondary | ICD-10-CM

## 2016-10-19 DIAGNOSIS — M999 Biomechanical lesion, unspecified: Secondary | ICD-10-CM

## 2016-10-19 NOTE — Progress Notes (Signed)
  CC: Patient is a 51 year old female followup for neck pain  HPI: Patient was seen previously for neck pain. Patient states that she has been moving her mom out of an apartment. Done with the moving but more anxiety due to job.  More pain in the neck as well.  No numbness. Patient states this more tightness than usual.  Past Medical History:  Diagnosis Date  . Asthma with allergic rhinitis   . Elevated liver enzymes   . Liver disease    Fatty liver  . Migraines   . Polycystic ovaries 1984  . Sleep apnea syndrome   . Subclinical hypothyroidism    Past Surgical History:  Procedure Laterality Date  . Laproscopy  2011  . LASER ABLATION  2012  . LITHOTRIPSY  2000   Social History  Substance Use Topics  . Smoking status: Never Smoker  . Smokeless tobacco: Never Used  . Alcohol use No   Allergies  Allergen Reactions  . Relafen [Nabumetone] Hives, Itching and Swelling  . Sulfa Antibiotics   . Clindamycin/Lincomycin Rash    Oral ONLY   Family History  Problem Relation Age of Onset  . Alcohol abuse Father   . Alcohol abuse Brother   . Coronary artery disease Maternal Grandmother   . Transient ischemic attack Maternal Grandmother   . Coronary artery disease Maternal Grandfather   . Heart attack Maternal Grandfather   . Transient ischemic attack Paternal Grandfather      Past medical, surgical, family and social history reviewed. Medications reviewed all in the electronic medical record.  Review of Systems: No headache, visual changes, nausea, vomiting, diarrhea, constipation, dizziness, abdominal pain, skin rash, fevers, chills, night sweats, weight loss, swollen lymph nodes, joint swelling, chest pain, shortness of breath, mood changes.  Positive muscle aches   Objective:    Blood pressure 110/82, pulse 84, height 5\' 4"  (1.626 m), weight 188 lb (85.3 kg), SpO2 97 %.   Systems examined below as of 10/19/16 General: NAD A&O x3 mood, affect normal  HEENT: Pupils equal,  extraocular movements intact no nystagmus Respiratory: not short of breath at rest or with speaking Cardiovascular: No lower extremity edema, non tender Skin: Warm dry intact with no signs of infection or rash on extremities or on axial skeleton. Abdomen: Soft nontender, no masses Neuro: Cranial nerves  intact, neurovascularly intact in all extremities with 2+ DTRs and 2+ pulses. Lymph: No lymphadenopathy appreciated today  Gait normal with good balance and coordination.  MSK: Non tender with full range of motion and good stability and symmetric strength and tone of shoulders, elbows, wrist,  knee hips and ankles bilaterally.    Neck: Inspection unremarkable. No palpable stepoffs. Negative Spurling's maneuver. Lacks last 10 of extension. Grip strength and sensation normal in bilateral hands Strength good C4 to T1 distribution No sensory change to C4 to T1 Negative Hoffman sign bilaterally Reflexes normal More tightness of the trapezius bilaterally  Osteopathic findings C2 flexed rotated and side bent right C4 flexed rotated and side bent left C7 flexed rotated and side bent left T3 extended rotated and side bent right inhaled third rib T11 extended rotated and side bent left L3 flexed rotated and side bent right Sacrum right on right   Impression and Recommendations:     This case required medical decision making of moderate complexity.

## 2016-10-19 NOTE — Assessment & Plan Note (Signed)
Continues to have tightness. We discussed with patient about the tightness of the trapezius. We discussed icing regimen and home exercise. We discussed working on the posture and ergonomics. Patient will come back and see me again in 4-6 weeks.

## 2016-10-19 NOTE — Patient Instructions (Signed)
Good to see you  Sprry  For the changes and it will take some time to get all the way better and stress will play a role Maybe take a little more time for yourself Posture is key  See me again in 6 weeks

## 2016-10-19 NOTE — Assessment & Plan Note (Signed)
Decision today to treat with OMT was based on Physical Exam  After verbal consent patient was treated with HVLA, ME, FPR techniques in cervical, thoracic, rib, lumbar and sacral areas  Patient tolerated the procedure well with improvement in symptoms  Patient given exercises, stretches and lifestyle modifications  See medications in patient instructions if given  Patient will follow up in 4-6 weeks 

## 2016-11-29 ENCOUNTER — Encounter: Payer: Self-pay | Admitting: Family Medicine

## 2016-11-29 ENCOUNTER — Ambulatory Visit (INDEPENDENT_AMBULATORY_CARE_PROVIDER_SITE_OTHER): Payer: BLUE CROSS/BLUE SHIELD | Admitting: Family Medicine

## 2016-11-29 VITALS — HR 81 | Ht 64.0 in | Wt 191.0 lb

## 2016-11-29 DIAGNOSIS — M999 Biomechanical lesion, unspecified: Secondary | ICD-10-CM | POA: Diagnosis not present

## 2016-11-29 DIAGNOSIS — M542 Cervicalgia: Secondary | ICD-10-CM

## 2016-11-29 NOTE — Assessment & Plan Note (Signed)
Decision today to treat with OMT was based on Physical Exam  After verbal consent patient was treated with HVLA, ME, FPR techniques in cervical, thoracic, rib lumbar and sacral areas  Patient tolerated the procedure well with improvement in symptoms  Patient given exercises, stretches and lifestyle modifications  See medications in patient instructions if given  Patient will follow up in 4 weeks 

## 2016-11-29 NOTE — Patient Instructions (Signed)
Good to see you  Julia [name] is your friend.  Try to find time for yourself.  Stay active.  Posture and you know the rest  See me again in 4-5 weeks.

## 2016-11-29 NOTE — Assessment & Plan Note (Signed)
Patient still more postural anything off at this time. I do think anxiety plays a role. We discussed once again about ergonomics, working conditions, we discussed possibly avoiding certain lifting mechanics. Patient will try to make these different changes and come back and see me again in 4-6 weeks for further evaluation and treatment.

## 2016-11-29 NOTE — Progress Notes (Signed)
[name] [address] [place], [place] [address] Phone: [phone] Subjective:     CC: Neck pain and headaches follow up  [id number]  Julia [name] is a 51 y.o. female coming in for follow up for cervical and lumbar spine pain. She said that her neck is stiff, lower back is fine. Patient continues to have neck pain. Still has daily headaches. Doing the medications fairly regularly. Does respond well to manipulation in the past. Increasing stress recently with her daughter and work     Past Medical History:  Diagnosis Date  . Asthma with allergic rhinitis   . Elevated liver enzymes   . Liver disease    Fatty liver  . Migraines   . Polycystic ovaries 1984  . Sleep apnea syndrome   . Subclinical hypothyroidism    Past Surgical History:  Procedure Laterality Date  . Laproscopy  2011  . LASER ABLATION  2012  . LITHOTRIPSY  2000   Social History   Social History  . Marital status: Married    Spouse name: N/A  . Number of children: N/A  . Years of education: N/A   Social History Main Topics  . Smoking status: Never Smoker  . Smokeless tobacco: Never Used  . Alcohol use No  . Drug use: No     Comment: Caffiene: None.  . Sexual activity: Not Currently    Partners: Male   Other Topics Concern  . Not on file   Social History Narrative  . No narrative on file   Allergies  Allergen Reactions  . Relafen [Nabumetone] Hives, Itching and Swelling  . Sulfa Antibiotics   . Clindamycin/Lincomycin Rash    Oral ONLY   Family History  Problem Relation Age of Onset  . Alcohol abuse Father   . Alcohol abuse Brother   . Coronary artery disease Maternal Grandmother   . Transient ischemic attack Maternal Grandmother   . Coronary artery disease Maternal Grandfather   . Heart attack Maternal Grandfather   . Transient ischemic attack Paternal Grandfather      Past medical history, social, surgical and family history all reviewed in  electronic medical record.  No pertanent information unless stated regarding to the chief complaint.   Review of Systems:Review of systems updated and as accurate as of 11/29/16  No  visual changes, nausea, vomiting, diarrhea, constipation, dizziness, abdominal pain, skin rash, fevers, chills, night sweats, weight loss, swollen lymph nodes, body aches, joint swelling,  chest pain, shortness of breath, mood changes. Positive muscle aches and headaches  Objective  There were no vitals taken for this visit. Systems examined below as of 11/29/16   General: No apparent distress alert and oriented x3 mood and affect normal, dressed appropriately.  HEENT: Pupils equal, extraocular movements intact  Respiratory: Patient's speak in full sentences and does not appear short of breath  Cardiovascular: No lower extremity edema, non tender, no erythema  Skin: Warm dry intact with no signs of infection or rash on extremities or on axial skeleton.  Abdomen: Soft nontender  Neuro: Cranial nerves II through XII are intact, neurovascularly intact in all extremities with 2+ DTRs and 2+ pulses.  Lymph: No lymphadenopathy of posterior or anterior cervical chain or axillae bilaterally.  Gait normal with good balance and coordination.  MSK:  Non tender with full range of motion and good stability and symmetric strength and tone of shoulders, elbows, wrist, hip, knee and ankles bilaterally.  Neck: Inspection mild loss of lordosis.  No palpable stepoffs. Negative Spurling's maneuver. Mild decrease in range of motion lacking the last 5-10. Grip strength and sensation normal in bilateral hands Strength good C4 to T1 distribution No sensory change to C4 to T1 Negative Hoffman sign bilaterally Reflexes normal Tightness trapezius muscles bilaterally.  Osteopathic findings C2 flexed rotated and side bent right C4 flexed rotated and side bent left C7 flexed rotated and side bent left T3 extended rotated and side  bent right inhaled third rib T7 extended rotated and side bent left with inhaled rib L3 flexed rotated and side bent right Sacrum right on right    Impression and Recommendations:     This case required medical decision making of moderate complexity.      Note: This dictation was prepared with Dragon dictation along with smaller phrase technology. Any transcriptional errors that result from this process are unintentional.

## 2016-12-29 ENCOUNTER — Encounter: Payer: Self-pay | Admitting: Family Medicine

## 2016-12-29 ENCOUNTER — Ambulatory Visit (INDEPENDENT_AMBULATORY_CARE_PROVIDER_SITE_OTHER): Payer: BLUE CROSS/BLUE SHIELD | Admitting: Family Medicine

## 2016-12-29 VITALS — BP 120/70 | HR 74 | Ht 64.0 in | Wt 189.0 lb

## 2016-12-29 DIAGNOSIS — M542 Cervicalgia: Secondary | ICD-10-CM

## 2016-12-29 DIAGNOSIS — M999 Biomechanical lesion, unspecified: Secondary | ICD-10-CM | POA: Diagnosis not present

## 2016-12-29 NOTE — Patient Instructions (Signed)
Good to see you  I hope works get better Keep working on the posture Look at orthotics and we may need a new pair at some point See me again in 4 weeks

## 2016-12-29 NOTE — Progress Notes (Signed)
[name] [address] [place], [place] [address] Phone: [phone] Subjective:     CC: Headaches and neck pain follow-up  [id number]  Julia [name] is a 51 y.o. female coming in with complaint of headache and neck pain. Patient has been seen numerous times. Does have poor posture and ergonomics. Patient is fairly noncompliant with the exercises. Continues to have chronic headaches though. Continues to attempt to make changes. We do make some improvement with the osteopathic manipulation. We discussed icing regimen, which activities to avoid. Patient has been not following these directions at the moment. Also having increasing stress as well.      Past Medical History:  Diagnosis Date  . Asthma with allergic rhinitis   . Elevated liver enzymes   . Liver disease    Fatty liver  . Migraines   . Polycystic ovaries 1984  . Sleep apnea syndrome   . Subclinical hypothyroidism    Past Surgical History:  Procedure Laterality Date  . Laproscopy  2011  . LASER ABLATION  2012  . LITHOTRIPSY  2000   Social History   Social History  . Marital status: Married    Spouse name: N/A  . Number of children: N/A  . Years of education: N/A   Social History Main Topics  . Smoking status: Never Smoker  . Smokeless tobacco: Never Used  . Alcohol use No  . Drug use: No     Comment: Caffiene: None.  . Sexual activity: Not Currently    Partners: Male   Other Topics Concern  . None   Social History Narrative  . None   Allergies  Allergen Reactions  . Relafen [Nabumetone] Hives, Itching and Swelling  . Sulfa Antibiotics   . Clindamycin/Lincomycin Rash    Oral ONLY   Family History  Problem Relation Age of Onset  . Alcohol abuse Father   . Alcohol abuse Brother   . Coronary artery disease Maternal Grandmother   . Transient ischemic attack Maternal Grandmother   . Coronary artery disease Maternal Grandfather   . Heart attack Maternal  Grandfather   . Transient ischemic attack Paternal Grandfather      Past medical history, social, surgical and family history all reviewed in electronic medical record.  No pertanent information unless stated regarding to the chief complaint.   Review of Systems:Review of systems updated and as accurate as of 12/29/16  No headache, visual changes, nausea, vomiting, diarrhea, constipation, dizziness, abdominal pain, skin rash, fevers, chills, night sweats, weight loss, swollen lymph nodes, body aches, joint swelling, muscle aches, chest pain, shortness of breath, mood changes.   Objective  Blood pressure 120/70, pulse 74, height 5\' 4"  (1.626 m), weight 189 lb (85.7 kg), SpO2 98 %. Systems examined below as of 12/29/16   General: No apparent distress alert and oriented x3 mood and affect normal, dressed appropriately.  HEENT: Pupils equal, extraocular movements intact  Respiratory: Patient's speak in full sentences and does not appear short of breath  Cardiovascular: No lower extremity edema, non tender, no erythema  Skin: Warm dry intact with no signs of infection or rash on extremities or on axial skeleton.  Abdomen: Soft nontender  Neuro: Cranial nerves II through XII are intact, neurovascularly intact in all extremities with 2+ DTRs and 2+ pulses.  Lymph: No lymphadenopathy of posterior or anterior cervical chain or axillae bilaterally.  Gait normal with good balance and coordination.  MSK:  Non tender with full range of motion and good  stability and symmetric strength and tone of shoulders, elbows, wrist, hip, knee and ankles bilaterally.  Neck: Inspection unremarkable. No palpable stepoffs. Negative Spurling's maneuver. Limited range of motion with rotation and side bending by at least 5. Grip strength and sensation normal in bilateral hands Strength good C4 to T1 distribution No sensory change to C4 to T1 Negative Hoffman sign bilaterally Reflexes normal Per posture with mild  increasing kyphosis of the upper back.  Osteopathic findings C2 flexed rotated and side bent right C4 flexed rotated and side bent left C7 flexed rotated and side bent right  T2 extended rotated and side bent right inhaled rib T6 extended rotated and side bent left L2 flexed rotated and side bent right Sacrum right on right    Impression and Recommendations:     This case required medical decision making of moderate complexity.      Note: This dictation was prepared with Dragon dictation along with smaller phrase technology. Any transcriptional errors that result from this process are unintentional.

## 2016-12-29 NOTE — Assessment & Plan Note (Signed)
Still secondary to posture. 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 Patient continues other medications as well. Patient's does not want to do any physical therapy at this time. Discussed the possibility of trigger point. Patient will continue to be active. Follow-up again in 4-6 weeks.

## 2016-12-29 NOTE — Assessment & Plan Note (Signed)
Decision today to treat with OMT was based on Physical Exam  After verbal consent patient was treated with HVLA, ME, FPR techniques in cervical, thoracic, rib, lumbar and sacral areas  Patient tolerated the procedure well with improvement in symptoms  Patient given exercises, stretches and lifestyle modifications  See medications in patient instructions if given  Patient will follow up in 4-6 weeks 

## 2017-01-23 NOTE — Progress Notes (Signed)
[facility] Southwell D.[facility] [address] [place], [place] [address] Phone: [phone] Subjective:    I'm seeing this patient by the request  of:    CC: Neck and back pain  MWU:[id number]:Subjective  Julia FloodJennifer Marchi is a 51 y.o. female coming in for follow up for back pain. She is having pain in the 4th toe on the right foot and has brought her shoes in to have them evaluated for support.  Patient is better she is to see what other changes can be made.  In addition of this patient's back is seems to be doing relatively well.  Some pain.  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  No longer able to do the 8-hour shift she was doing previously.     Past Medical History:  Diagnosis Date  . Asthma with allergic rhinitis   . Elevated liver enzymes   . Liver disease    Fatty liver  . Migraines   . Polycystic ovaries 1984  . Sleep apnea syndrome   . Subclinical hypothyroidism    Past Surgical History:  Procedure Laterality Date  . Laproscopy  2011  . LASER ABLATION  2012  . LITHOTRIPSY  2000   Social History   Socioeconomic History  . Marital status: Married    Spouse name: None  . Number of children: None  . Years of education: None  . Highest education level: None  Social Needs  . Financial resource strain: None  . Food insecurity - worry: None  . Food insecurity - inability: None  . Transportation needs - medical: None  . Transportation needs - non-medical: None  Occupational History  . None  Tobacco Use  . Smoking status: Never Smoker  . Smokeless tobacco: Never Used  Substance and Sexual Activity  . Alcohol use: No  . Drug use: No    Comment: Caffiene: None.  . Sexual activity: Not Currently    Partners: Male  Other Topics Concern  . None  Social History Narrative  . None   Allergies  Allergen Reactions  . Relafen [Nabumetone] Hives, Itching and Swelling  . Sulfa Antibiotics   . Clindamycin/Lincomycin Rash   Oral ONLY   Family History  Problem Relation Age of Onset  . Alcohol abuse Father   . Alcohol abuse Brother   . Coronary artery disease Maternal Grandmother   . Transient ischemic attack Maternal Grandmother   . Coronary artery disease Maternal Grandfather   . Heart attack Maternal Grandfather   . Transient ischemic attack Paternal Grandfather      Past medical history, social, surgical and family history all reviewed in electronic medical record.  No pertanent information unless stated regarding to the chief complaint.   Review of Systems:Review of systems updated and as accurate as of 01/24/17  No  visual changes, nausea, vomiting, diarrhea, constipation, dizziness, abdominal pain, skin rash, fevers, chills, night sweats, weight loss, swollen lymph nodes, body aches, joint swelling,  chest pain, shortness of breath, mood changes.  Positive headache and muscle aches  Objective  Blood pressure 110/82, pulse (!) 110, height 5\' 4"  (1.626 m), weight 189 lb (85.7 kg), SpO2 98 %. Systems examined below as of 01/24/17   General: No apparent distress alert and oriented x3 mood and affect normal, dressed appropriately.  HEENT: Pupils equal, extraocular movements intact  Respiratory: Patient's speak in full sentences and does not appear short of breath  Cardiovascular: No lower extremity edema, non  tender, no erythema  Skin: Warm dry intact with no signs of infection or rash on extremities or on axial skeleton.  Abdomen: Soft nontender  Neuro: Cranial nerves II through XII are intact, neurovascularly intact in all extremities with 2+ DTRs and 2+ pulses.  Lymph: No lymphadenopathy of posterior or anterior cervical chain or axillae bilaterally.  Gait normal with good balance and coordination.  MSK: Mild tender with full range of motion and good stability and symmetric strength and tone of shoulders, elbows, wrist, hip, knee and ankles bilaterally.  Neck: Inspection loss of lordosis. No  palpable stepoffs. Negative Spurling's maneuver. Mild limitation lacking last 5-10 degrees today Grip strength and sensation normal in bilateral hands Strength good C4 to T1 distribution No sensory change to C4 to T1 Negative Hoffman sign bilaterally Reflexes normal   Back exam also shows significant tightness in the sacroiliac joints bilaterally with positive Faber's.  Osteopathic findings  C2 flexed rotated and side bent right T5 extended rotated and side bent left with inhaled rib L3 flexed rotated and side bent left  Sacrum right on right      Impression and Recommendations:     This case required medical decision making of moderate complexity.      Note: This dictation was prepared with Dragon dictation along with smaller phrase technology. Any transcriptional errors that result from this process are unintentional.

## 2017-01-24 ENCOUNTER — Encounter: Payer: Self-pay | Admitting: Family Medicine

## 2017-01-24 ENCOUNTER — Ambulatory Visit (INDEPENDENT_AMBULATORY_CARE_PROVIDER_SITE_OTHER): Payer: BLUE CROSS/BLUE SHIELD | Admitting: Family Medicine

## 2017-01-24 VITALS — BP 110/82 | HR 110 | Ht 64.0 in | Wt 189.0 lb

## 2017-01-24 DIAGNOSIS — M542 Cervicalgia: Secondary | ICD-10-CM | POA: Diagnosis not present

## 2017-01-24 DIAGNOSIS — M999 Biomechanical lesion, unspecified: Secondary | ICD-10-CM

## 2017-01-24 NOTE — Assessment & Plan Note (Signed)
Continues to have some difficulty.  We discussed with patient at great length.  We discussed icing regimen.  Core strength and stability.  We discussed which activities to do which wants to avoid.  Patient is to increase activity slowly over the course the next several days.  Follow-up with me again 4 weeks

## 2017-01-24 NOTE — Assessment & Plan Note (Signed)
Decision today to treat with OMT was based on Physical Exam  After verbal consent patient was treated with HVLA, ME, FPR techniques in cervical, thoracic, rib, lumbar and sacral areas  Patient tolerated the procedure well with improvement in symptoms  Patient given exercises, stretches and lifestyle modifications  See medications in patient instructions if given  Patient will follow up in 4 weeks 

## 2017-01-24 NOTE — Patient Instructions (Addendum)
Good to see you  Do not lace the last eye of the shoe lace Try the other orthotic in the white shoe Possibly ice at the end of a long day  See me again in 4 weeks Happy holidays!

## 2017-02-21 ENCOUNTER — Encounter: Payer: Self-pay | Admitting: Family Medicine

## 2017-02-21 ENCOUNTER — Ambulatory Visit (INDEPENDENT_AMBULATORY_CARE_PROVIDER_SITE_OTHER): Payer: BLUE CROSS/BLUE SHIELD | Admitting: Family Medicine

## 2017-02-21 VITALS — BP 124/90 | HR 75 | Ht 64.0 in | Wt 186.0 lb

## 2017-02-21 DIAGNOSIS — M999 Biomechanical lesion, unspecified: Secondary | ICD-10-CM | POA: Diagnosis not present

## 2017-02-21 DIAGNOSIS — G8929 Other chronic pain: Secondary | ICD-10-CM | POA: Diagnosis not present

## 2017-02-21 DIAGNOSIS — M545 Low back pain: Secondary | ICD-10-CM | POA: Diagnosis not present

## 2017-02-21 DIAGNOSIS — M542 Cervicalgia: Secondary | ICD-10-CM | POA: Diagnosis not present

## 2017-02-21 NOTE — Assessment & Plan Note (Signed)
Decision today to treat with OMT was based on Physical Exam  After verbal consent patient was treated with HVLA, ME, FPR techniques in cervical, thoracic, rib, lumbar and sacral areas  Patient tolerated the procedure well with improvement in symptoms  Patient given exercises, stretches and lifestyle modifications  See medications in patient instructions if given  Patient will follow up in 4-6 weeks 

## 2017-02-21 NOTE — Patient Instructions (Signed)
overall not too bad Keep trying to work on the posture Hopefully no more snow Happy New Year!  See you in 4-5 weeks!

## 2017-02-21 NOTE — Assessment & Plan Note (Signed)
Describes the pain as a dull, throbbing aching sensation.  Stability.  Neck pain seems to be the worsening aspect.  Follow-up again in 4-6 weeks

## 2017-02-21 NOTE — Assessment & Plan Note (Signed)
Mild increase in tightness from previous exam.  We discussed icing regimen, home exercise, which activities to do which wants to avoid.  Patient is to increase activity slowly over the course of next several days.

## 2017-02-21 NOTE — Progress Notes (Signed)
[facility] [address] [place], [place] [address] Phone: [phone] Subjective:      CC: Neck pain and back pain follow-up  [id number]:Subjective  Julia FloodJennifer [name] is a 51 y.o. female coming in with complaint of neck and back pain follow-up.  Have seen patient on multiple occasions.  Has been doing relatively well with home exercises, over-the-counter medications, as well as we have change from the patient's compressive medications over the course of time.  Patient states doing relatively well overall.  Did have some significant tightness after shoveling.  Patient denies any significant new problems    Past Medical History:  Diagnosis Date  . Asthma with allergic rhinitis   . Elevated liver enzymes   . Liver disease    Fatty liver  . Migraines   . Polycystic ovaries 1984  . Sleep apnea syndrome   . Subclinical hypothyroidism    Past Surgical History:  Procedure Laterality Date  . Laproscopy  2011  . LASER ABLATION  2012  . LITHOTRIPSY  2000   Social History   Socioeconomic History  . Marital status: Married    Spouse name: None  . Number of children: None  . Years of education: None  . Highest education level: None  Social Needs  . Financial resource strain: None  . Food insecurity - worry: None  . Food insecurity - inability: None  . Transportation needs - medical: None  . Transportation needs - non-medical: None  Occupational History  . None  Tobacco Use  . Smoking status: Never Smoker  . Smokeless tobacco: Never Used  Substance and Sexual Activity  . Alcohol use: No  . Drug use: No    Comment: Caffiene: None.  . Sexual activity: Not Currently    Partners: Male  Other Topics Concern  . None  Social History Narrative  . None   Allergies  Allergen Reactions  . Relafen [Nabumetone] Hives, Itching and Swelling  . Sulfa Antibiotics   . Clindamycin/Lincomycin Rash    Oral ONLY   Family History  Problem Relation Age of Onset  .  Alcohol abuse Father   . Alcohol abuse Brother   . Coronary artery disease Maternal Grandmother   . Transient ischemic attack Maternal Grandmother   . Coronary artery disease Maternal Grandfather   . Heart attack Maternal Grandfather   . Transient ischemic attack Paternal Grandfather      Past medical history, social, surgical and family history all reviewed in electronic medical record.  No pertanent information unless stated regarding to the chief complaint.   Review of Systems:Review of systems updated and as accurate as of 02/21/17  No headache, visual changes, nausea, vomiting, diarrhea, constipation, dizziness, abdominal pain, skin rash, fevers, chills, night sweats, weight loss, swollen lymph nodes, body aches, joint swelling,  chest pain, shortness of breath, mood changes.  Positive muscle aches  Objective  Blood pressure 124/90, pulse 75, height 5\' 4"  (1.626 m), weight 186 lb (84.4 kg), SpO2 97 %. Systems examined below as of 02/21/17   General: No apparent distress alert and oriented x3 mood and affect normal, dressed appropriately.  HEENT: Pupils equal, extraocular movements intact  Respiratory: Patient's speak in full sentences and does not appear short of breath  Cardiovascular: No lower extremity edema, non tender, no erythema  Skin: Warm dry intact with no signs of infection or rash on extremities or on axial skeleton.  Abdomen: Soft nontender  Neuro: Cranial nerves II through XII are intact, neurovascularly  intact in all extremities with 2+ DTRs and 2+ pulses.  Lymph: No lymphadenopathy of posterior or anterior cervical chain or axillae bilaterally.  Gait normal with good balance and coordination.  MSK:  Non tender with full range of motion and good stability and symmetric strength and tone of shoulders, elbows, wrist, hip, knee and ankles bilaterally.  Poor core strength Neck: Inspection mild loss of lordosis. No palpable stepoffs. Negative Spurling's  maneuver. Decreasing range of motion lacking the last 5 degrees of extension in the last 10 degrees of sidebending Grip strength and sensation normal in bilateral hands Strength good C4 to T1 distribution No sensory change to C4 to T1 Negative Hoffman sign bilaterally Reflexes normal  Osteopathic findings C3 flexed rotated and side bent rightt T3 extended rotated and side bent right inhaled third rib T9 extended rotated and side bent left with inhaled rib L2 flexed rotated and side bent right Sacrum right on right     Impression and Recommendations:     This case required medical decision making of moderate complexity.      Note: This dictation was prepared with Dragon dictation along with smaller phrase technology. Any transcriptional errors that result from this process are unintentional.       ;

## 2017-03-22 NOTE — Progress Notes (Signed)
[name] [address] [place], [place] [address] Phone: [phone] Subjective:     CC: back pain   OZH:[id number]  Julia [name] is a 52 y.o. female coming in with complaint of back pain.  Patient seen multiple times.  Did fall recently and hurt her left shoulder.  Patient states that she continues to have the tightness of the neck.  No radiation down the arm no numbness.  Has responded well to osteopathic manipulation previously.  States that just increasing stiffness and tightness.      Past Medical History:  Diagnosis Date  . Asthma with allergic rhinitis   . Elevated liver enzymes   . Liver disease    Fatty liver  . Migraines   . Polycystic ovaries 1984  . Sleep apnea syndrome   . Subclinical hypothyroidism    Past Surgical History:  Procedure Laterality Date  . Laproscopy  2011  . LASER ABLATION  2012  . LITHOTRIPSY  2000   Social History   Socioeconomic History  . Marital status: Married    Spouse name: None  . Number of children: None  . Years of education: None  . Highest education level: None  Social Needs  . Financial resource strain: None  . Food insecurity - worry: None  . Food insecurity - inability: None  . Transportation needs - medical: None  . Transportation needs - non-medical: None  Occupational History  . None  Tobacco Use  . Smoking status: Never Smoker  . Smokeless tobacco: Never Used  Substance and Sexual Activity  . Alcohol use: No  . Drug use: No    Comment: Caffiene: None.  . Sexual activity: Not Currently    Partners: Male  Other Topics Concern  . None  Social History Narrative  . None   Allergies  Allergen Reactions  . Relafen [Nabumetone] Hives, Itching and Swelling  . Sulfa Antibiotics   . Clindamycin/Lincomycin Rash    Oral ONLY   Family History  Problem Relation Age of Onset  . Alcohol abuse Father   . Alcohol abuse Brother   . Coronary artery disease Maternal Grandmother   .  Transient ischemic attack Maternal Grandmother   . Coronary artery disease Maternal Grandfather   . Heart attack Maternal Grandfather   . Transient ischemic attack Paternal Grandfather      Past medical history, social, surgical and family history all reviewed in electronic medical record.  No pertanent information unless stated regarding to the chief complaint.   Review of Systems:Review of systems updated and as accurate as of 03/23/17  No  visual changes, nausea, vomiting, diarrhea, constipation, dizziness, abdominal pain, skin rash, fevers, chills, night sweats, weight loss, swollen lymph nodes, joint swelling,  chest pain, shortness of breath, mood changes.  No headaches, muscle aches, body aches  Objective  Blood pressure 104/90, pulse 91, height 5\' 4"  (1.626 m), weight 188 lb (85.3 kg), SpO2 98 %. Systems examined below as of 03/23/17   General: No apparent distress alert and oriented x3 mood and affect normal, dressed appropriately.  HEENT: Pupils equal, extraocular movements intact  Respiratory: Patient's speak in full sentences and does not appear short of breath  Cardiovascular: No lower extremity edema, non tender, no erythema  Skin: Warm dry intact with no signs of infection or rash on extremities or on axial skeleton.  Abdomen: Soft nontender  Neuro: Cranial nerves II through XII are intact, neurovascularly intact in all extremities with 2+ DTRs and 2+  pulses.  Lymph: No lymphadenopathy of posterior or anterior cervical chain or axillae bilaterally.  Gait normal with good balance and coordination.  MSK:  Non tender with full range of motion and good stability and symmetric strength and tone of shoulders, elbows, wrist, hip, knee and ankles bilaterally.  Neck: Inspection loss of lordosis. No palpable stepoffs. Negative Spurling's maneuver. Mild tightness Grip strength and sensation normal in bilateral hands Strength good C4 to T1 distribution No sensory change to C4 to  T1 Negative Hoffman sign bilaterally Reflexes normal  Osteopathic findings C2 flexed rotated and side bent right C4 flexed rotated and side bent left C7 flexed rotated and side bent left T3 extended rotated and side bent right inhaled third rib T6 extended rotated and side bent left L2 flexed rotated and side bent right Sacrum right on right     Impression and Recommendations:     This case required medical decision making of moderate complexity.      Note: This dictation was prepared with Dragon dictation along with smaller phrase technology. Any transcriptional errors that result from this process are unintentional.

## 2017-03-23 ENCOUNTER — Ambulatory Visit (INDEPENDENT_AMBULATORY_CARE_PROVIDER_SITE_OTHER): Payer: BLUE CROSS/BLUE SHIELD | Admitting: Family Medicine

## 2017-03-23 ENCOUNTER — Encounter: Payer: Self-pay | Admitting: Family Medicine

## 2017-03-23 VITALS — BP 104/90 | HR 91 | Ht 64.0 in | Wt 188.0 lb

## 2017-03-23 DIAGNOSIS — M542 Cervicalgia: Secondary | ICD-10-CM | POA: Diagnosis not present

## 2017-03-23 DIAGNOSIS — M999 Biomechanical lesion, unspecified: Secondary | ICD-10-CM

## 2017-03-23 NOTE — Assessment & Plan Note (Signed)
Still mostly musculature.  Likely not any radicular symptoms.  Not likely causing the shoulder pain is not concerned that this will be a long-term problem.  Patient will continue to work on posture and ergonomics..  See me again in 4-8 weeks

## 2017-03-23 NOTE — Patient Instructions (Signed)
Good to see you  Julia [name] is your friend.  Watch the shoulder but you should be good.  Get back to your routine.  See me again in 4-6 weeks!

## 2017-03-23 NOTE — Assessment & Plan Note (Signed)
Decision today to treat with OMT was based on Physical Exam  After verbal consent patient was treated with HVLA, ME, FPR techniques in cervical, thoracic, rib,  lumbar and sacral areas  Patient tolerated the procedure well with improvement in symptoms  Patient given exercises, stretches and lifestyle modifications  See medications in patient instructions if given  Patient will follow up in 4-8 weeks 

## 2017-03-27 ENCOUNTER — Ambulatory Visit: Payer: Self-pay | Admitting: Family Medicine

## 2017-05-03 NOTE — Progress Notes (Signed)
[name] [address] [place], [place] [address] Phone: [phone] Subjective:    I'm seeing this patient by the request  of:    CC: Neck pain follow-up  OZH:[id number]  Julia [name] is a 52 y.o. female coming in with complaint of neck pain.  Seems to be having a little worsening at the moment.  No radiation.  Still left shoulder pain.  Patient denies any weakness.  States that just more work related.  Does have a history of migraines.  Possibly need a change of her thyroid medication recently.     Past Medical History:  Diagnosis Date  . Asthma with allergic rhinitis   . Elevated liver enzymes   . Liver disease    Fatty liver  . Migraines   . Polycystic ovaries 1984  . Sleep apnea syndrome   . Subclinical hypothyroidism    Past Surgical History:  Procedure Laterality Date  . Laproscopy  2011  . LASER ABLATION  2012  . LITHOTRIPSY  2000   Social History   Socioeconomic History  . Marital status: Married    Spouse name: None  . Number of children: None  . Years of education: None  . Highest education level: None  Social Needs  . Financial resource strain: None  . Food insecurity - worry: None  . Food insecurity - inability: None  . Transportation needs - medical: None  . Transportation needs - non-medical: None  Occupational History  . None  Tobacco Use  . Smoking status: Never Smoker  . Smokeless tobacco: Never Used  Substance and Sexual Activity  . Alcohol use: No  . Drug use: No    Comment: Caffiene: None.  . Sexual activity: Not Currently    Partners: Male  Other Topics Concern  . None  Social History Narrative  . None   Allergies  Allergen Reactions  . Relafen [Nabumetone] Hives, Itching and Swelling  . Sulfa Antibiotics   . Clindamycin/Lincomycin Rash    Oral ONLY   Family History  Problem Relation Age of Onset  . Alcohol abuse Father   . Alcohol abuse Brother   . Coronary artery disease Maternal  Grandmother   . Transient ischemic attack Maternal Grandmother   . Coronary artery disease Maternal Grandfather   . Heart attack Maternal Grandfather   . Transient ischemic attack Paternal Grandfather      Past medical history, social, surgical and family history all reviewed in electronic medical record.  No pertanent information unless stated regarding to the chief complaint.   Review of Systems:Review of systems updated and as accurate as of 05/04/17  No  visual changes, nausea, vomiting, diarrhea, constipation, dizziness, abdominal pain, skin rash, fevers, chills, night sweats, weight loss, swollen lymph nodes, body aches, joint swelling, chest pain, shortness of breath, mood changes.  Positive headache and muscle aches  Objective  Blood pressure 120/84, pulse 85, height 5\' 4"  (1.626 m), weight 190 lb (86.2 kg), SpO2 98 %. Systems examined below as of 05/04/17   General: No apparent distress alert and oriented x3 mood and affect normal, dressed appropriately.  HEENT: Pupils equal, extraocular movements intact  Respiratory: Patient's speak in full sentences and does not appear short of breath  Cardiovascular: No lower extremity edema, non tender, no erythema  Skin: Warm dry intact with no signs of infection or rash on extremities or on axial skeleton.  Abdomen: Soft nontender  Neuro: Cranial nerves II through XII are intact, neurovascularly intact in  all extremities with 2+ DTRs and 2+ pulses.  Lymph: No lymphadenopathy of posterior or anterior cervical chain or axillae bilaterally.  Gait normal with good balance and coordination.  MSK:  Non tender with full range of motion and good stability and symmetric strength and tone of shoulders, elbows, wrist, hip, knee and ankles bilaterally.  Neck: Inspection mild loss of lordosis of the neck and mild increase in kyphosis of the upper back. No palpable stepoffs. Negative Spurling's maneuver. Limited range of motion in all planes Grip  strength and sensation normal in bilateral hands Strength good C4 to T1 distribution No sensory change to C4 to T1 Negative Hoffman sign bilaterally Reflexes normal Tender palpation of the paraspinal musculature lumbar spine and mild over the right trapezius.  Osteopathic findings C2 flexed rotated and side bent right C4 flexed rotated and side bent left C6 flexed rotated and side bent left T3 extended rotated and side bent right inhaled third rib T9 extended rotated and side bent left L2 flexed rotated and side bent right Sacrum forward flexion     Impression and Recommendations:     This case required medical decision making of moderate complexity.      Note: This dictation was prepared with Dragon dictation along with smaller phrase technology. Any transcriptional errors that result from this process are unintentional.

## 2017-05-04 ENCOUNTER — Ambulatory Visit (INDEPENDENT_AMBULATORY_CARE_PROVIDER_SITE_OTHER): Payer: BLUE CROSS/BLUE SHIELD | Admitting: Family Medicine

## 2017-05-04 ENCOUNTER — Encounter: Payer: Self-pay | Admitting: Family Medicine

## 2017-05-04 VITALS — BP 120/84 | HR 85 | Ht 64.0 in | Wt 190.0 lb

## 2017-05-04 DIAGNOSIS — M999 Biomechanical lesion, unspecified: Secondary | ICD-10-CM | POA: Diagnosis not present

## 2017-05-04 DIAGNOSIS — M542 Cervicalgia: Secondary | ICD-10-CM

## 2017-05-04 NOTE — Assessment & Plan Note (Signed)
Decision today to treat with OMT was based on Physical Exam  After verbal consent patient was treated with HVLA, ME, FPR techniques in cervical, thoracic, rib, lumbar and sacral areas  Patient tolerated the procedure well with improvement in symptoms  Patient given exercises, stretches and lifestyle modifications  See medications in patient instructions if given  Patient will follow up in 6 weeks 

## 2017-05-04 NOTE — Assessment & Plan Note (Signed)
Musculoskeletal with mild degenerative disc disease.  Continues to respond well to osteopathic manipulation.  Discussed icing regimen and home exercises.  Patient will come back and see me again in 6 weeks

## 2017-05-04 NOTE — Patient Instructions (Signed)
Good to see you  Hope the thyroid is playing some role Keep working on the posture Calcium 1200mg  3 times a week  See me again in 4-6 weeks

## 2017-06-14 NOTE — Progress Notes (Signed)
[facility] [address] [place], [place] [address] Phone: [phone] Subjective:    I'm seeing this patient by the request  of:    CC: Neck pain follow-up  BJY:[id number]:Subjective  Julia FloodJennifer Shinsato is a 52 y.o. female coming in with complaint of neck pain with a history of migraines.  Has been attempting osteopathic manipulation.  Seems to be responding somewhat.  Patient still has headaches fairly regularly.  Patient denies though any numbness and tingling.     Past Medical History:  Diagnosis Date  . Asthma with allergic rhinitis   . Elevated liver enzymes   . Liver disease    Fatty liver  . Migraines   . Polycystic ovaries 1984  . Sleep apnea syndrome   . Subclinical hypothyroidism    Past Surgical History:  Procedure Laterality Date  . Laproscopy  2011  . LASER ABLATION  2012  . LITHOTRIPSY  2000   Social History   Socioeconomic History  . Marital status: Married    Spouse name: Not on file  . Number of children: Not on file  . Years of education: Not on file  . Highest education level: Not on file  Occupational History  . Not on file  Social Needs  . Financial resource strain: Not on file  . Food insecurity:    Worry: Not on file    Inability: Not on file  . Transportation needs:    Medical: Not on file    Non-medical: Not on file  Tobacco Use  . Smoking status: Never Smoker  . Smokeless tobacco: Never Used  Substance and Sexual Activity  . Alcohol use: No  . Drug use: No    Comment: Caffiene: None.  . Sexual activity: Not Currently    Partners: Male  Lifestyle  . Physical activity:    Days per week: Not on file    Minutes per session: Not on file  . Stress: Not on file  Relationships  . Social connections:    Talks on phone: Not on file    Gets together: Not on file    Attends religious service: Not on file    Active member of club or organization: Not on file    Attends meetings of clubs or organizations: Not on file      Relationship status: Not on file  Other Topics Concern  . Not on file  Social History Narrative  . Not on file   Allergies  Allergen Reactions  . Relafen [Nabumetone] Hives, Itching and Swelling  . Sulfa Antibiotics   . Clindamycin/Lincomycin Rash    Oral ONLY   Family History  Problem Relation Age of Onset  . Alcohol abuse Father   . Alcohol abuse Brother   . Coronary artery disease Maternal Grandmother   . Transient ischemic attack Maternal Grandmother   . Coronary artery disease Maternal Grandfather   . Heart attack Maternal Grandfather   . Transient ischemic attack Paternal Grandfather      Past medical history, social, surgical and family history all reviewed in electronic medical record.  No pertanent information unless stated regarding to the chief complaint.   Review of Systems:Review of systems updated and as accurate as of 06/15/17  No headache, visual changes, nausea, vomiting, diarrhea, constipation, dizziness, abdominal pain, skin rash, fevers, chills, night sweats, weight loss, swollen lymph nodes, body aches, joint swelling,  chest pain, shortness of breath, mood changes.  Positive muscle aches  Objective  Blood pressure 140/90,  pulse 82, height 5\' 4"  (1.626 m), weight 188 lb (85.3 kg), SpO2 98 %. Systems examined below as of 06/15/17   General: No apparent distress alert and oriented x3 mood and affect normal, dressed appropriately.  HEENT: Pupils equal, extraocular movements intact  Respiratory: Patient's speak in full sentences and does not appear short of breath  Cardiovascular: No lower extremity edema, non tender, no erythema  Skin: Warm dry intact with no signs of infection or rash on extremities or on axial skeleton.  Abdomen: Soft nontender  Neuro: Cranial nerves II through XII are intact, neurovascularly intact in all extremities with 2+ DTRs and 2+ pulses.  Lymph: No lymphadenopathy of posterior or anterior cervical chain or axillae bilaterally.   Gait normal with good balance and coordination.  MSK:  Non tender with full range of motion and good stability and symmetric strength and tone of shoulders, elbows, wrist, hip, knee and ankles bilaterally.  Patient has some loss of lordosis of the neck and increased kyphosis of the upper thoracic spine.  Patient has some mild decrease in sidebending bilaterally. lacks5 degrees of extension. Full strength of the upper extremities  Osteopathic findings C2 flexed rotated and side bent right C4 flexed rotated and side bent left T3 extended rotated and side bent right inhaled third rib L1 flexed rotated and side bent right Sacrum right on right    Impression and Recommendations:     This case required medical decision making of moderate complexity.      Note: This dictation was prepared with Dragon dictation along with smaller phrase technology. Any transcriptional errors that result from this process are unintentional.

## 2017-06-15 ENCOUNTER — Encounter: Payer: Self-pay | Admitting: Family Medicine

## 2017-06-15 ENCOUNTER — Ambulatory Visit (INDEPENDENT_AMBULATORY_CARE_PROVIDER_SITE_OTHER): Payer: BLUE CROSS/BLUE SHIELD | Admitting: Family Medicine

## 2017-06-15 VITALS — BP 140/90 | HR 82 | Ht 64.0 in | Wt 188.0 lb

## 2017-06-15 DIAGNOSIS — M542 Cervicalgia: Secondary | ICD-10-CM | POA: Diagnosis not present

## 2017-06-15 DIAGNOSIS — M999 Biomechanical lesion, unspecified: Secondary | ICD-10-CM | POA: Diagnosis not present

## 2017-06-15 NOTE — Patient Instructions (Signed)
Good to see you  Overall not too bad  Lets watch the neck  I hope the headaches get a little better  Lets not change much up though  See me again in 4-5 week s

## 2017-06-15 NOTE — Assessment & Plan Note (Signed)
Stable overall.  Seems to continue to have some muscle aches and pains.  Discussed multiple times about different treatment options including epidurals.  Patient declined the symptom moment.  We will continue with manipulation.  Follow-up again in 4-6 weeks.

## 2017-06-15 NOTE — Assessment & Plan Note (Addendum)
Decision today to treat with OMT was based on Physical Exam  After verbal consent patient was treated with HVLA, ME, FPR techniques in cervical, thoracic, rib, lumbar and sacral areas  Patient tolerated the procedure well with improvement in symptoms  Patient given exercises, stretches and lifestyle modifications  See medications in patient instructions if given  Patient will follow up in 4-6 weeks 

## 2017-07-17 NOTE — Progress Notes (Signed)
[name] [address] [place], [place] [address] Phone: [phone] Subjective:    I'm seeing this patient by the request  of:    CC: Neck and back pain follow-up  BJY:NWGNFAOZHY Patient is being seeing for a follow up for her back pain. She notes that she has been stressed and is having some neck pain. Patient continues to have headaches daily. Her left trap is most bothersome she notes.  Patient states some increasing stress recently.  Has been doing some yard work recently as well as seems to make some tightness.  Has difficulty staying hydrated throughout the day.       Past Medical History:  Diagnosis Date  . Asthma with allergic rhinitis   . Elevated liver enzymes   . Liver disease    Fatty liver  . Migraines   . Polycystic ovaries 1984  . Sleep apnea syndrome   . Subclinical hypothyroidism    Past Surgical History:  Procedure Laterality Date  . Laproscopy  2011  . LASER ABLATION  2012  . LITHOTRIPSY  2000   Social History   Socioeconomic History  . Marital status: Divorced    Spouse name: Not on file  . Number of children: Not on file  . Years of education: Not on file  . Highest education level: Not on file  Occupational History  . Not on file  Social Needs  . Financial resource strain: Not on file  . Food insecurity:    Worry: Not on file    Inability: Not on file  . Transportation needs:    Medical: Not on file    Non-medical: Not on file  Tobacco Use  . Smoking status: Never Smoker  . Smokeless tobacco: Never Used  Substance and Sexual Activity  . Alcohol use: No  . Drug use: No    Comment: Caffiene: None.  . Sexual activity: Not Currently    Partners: Male  Lifestyle  . Physical activity:    Days per week: Not on file    Minutes per session: Not on file  . Stress: Not on file  Relationships  . Social connections:    Talks on phone: Not on file    Gets together: Not on file    Attends religious service:  Not on file    Active member of club or organization: Not on file    Attends meetings of clubs or organizations: Not on file    Relationship status: Not on file  Other Topics Concern  . Not on file  Social History Narrative  . Not on file   Allergies  Allergen Reactions  . Relafen [Nabumetone] Hives, Itching and Swelling  . Sulfa Antibiotics   . Clindamycin/Lincomycin Rash    Oral ONLY   Family History  Problem Relation Age of Onset  . Alcohol abuse Father   . Alcohol abuse Brother   . Coronary artery disease Maternal Grandmother   . Transient ischemic attack Maternal Grandmother   . Coronary artery disease Maternal Grandfather   . Heart attack Maternal Grandfather   . Transient ischemic attack Paternal Grandfather      Past medical history, social, surgical and family history all reviewed in electronic medical record.  No pertanent information unless stated regarding to the chief complaint.   Review of Systems:Review of systems updated and as accurate as of 07/18/17  No headache, visual changes, nausea, vomiting, diarrhea, constipation, dizziness, abdominal pain, skin rash, fevers, chills, night sweats, weight  loss, swollen lymph nodes, body aches, joint swelling,  chest pain, shortness of breath, mood changes.  Positive muscle aches  Objective  Blood pressure 104/86, pulse 72, height  (1.626 m), weight 188 lb (85.3 kg), SpO2 98 %. Systems examined below as of 07/18/17   General: No apparent distress alert and oriented x3 mood and affect normal, dressed appropriately.  HEENT: Pupils equal, extraocular movements intact  Respiratory: Patient's speak in full sentences and does not appear short of breath  Cardiovascular: No lower extremity edema, non tender, no erythema  Skin: Warm dry intact with no signs of infection or rash on extremities or on axial skeleton.  Abdomen: Soft nontender  Neuro: Cranial nerves II through XII are intact, neurovascularly intact in all  extremities with 2+ DTRs and 2+ pulses.  Lymph: No lymphadenopathy of posterior or anterior cervical chain or axillae bilaterally.  Gait normal with good balance and coordination.  MSK:  Non tender with full range of motion and good stability and symmetric strength and tone of shoulders, elbows, wrist, hip, knee and ankles bilaterally.  Neck exam shows some mild loss of lordosis.  Some mild limited range of motion in all planes lacking last 5 to 10 degrees in extension and sidebending.  Negative Spurling's.  Full strength of the upper extremities. Tightness in the paraspinal musculature between the scapulas.   Osteopathic findings  C2 flexed rotated and side bent right C4 flexed rotated and side bent left T3 extended rotated and side bent right inhaled third rib T5 extended rotated and side bent left L3 flexed rotated and side bent right Sacrum right on right     Impression and Recommendations:     This case required medical decision making of moderate complexity.      Note: This dictation was prepared with Dragon dictation along with smaller phrase technology. Any transcriptional errors that result from this process are unintentional.

## 2017-07-18 ENCOUNTER — Ambulatory Visit (INDEPENDENT_AMBULATORY_CARE_PROVIDER_SITE_OTHER): Payer: BLUE CROSS/BLUE SHIELD | Admitting: Family Medicine

## 2017-07-18 ENCOUNTER — Encounter: Payer: Self-pay | Admitting: Family Medicine

## 2017-07-18 VITALS — BP 104/86 | HR 72 | Ht 64.0 in | Wt 188.0 lb

## 2017-07-18 DIAGNOSIS — M999 Biomechanical lesion, unspecified: Secondary | ICD-10-CM

## 2017-07-18 DIAGNOSIS — M542 Cervicalgia: Secondary | ICD-10-CM | POA: Diagnosis not present

## 2017-07-18 NOTE — Assessment & Plan Note (Signed)
Stable.  Still seems to be posture related and muscle imbalances.  Discussed home exercise, icing regimen, which activities to do which wants to avoid.  Patient is to increase activity as tolerated.  Patient will follow-up with me again in 4 to 8 weeks

## 2017-07-18 NOTE — Patient Instructions (Signed)
Always good to see you  Lets do 4 weeks I hope you brother does great

## 2017-07-18 NOTE — Assessment & Plan Note (Signed)
Decision today to treat with OMT was based on Physical Exam  After verbal consent patient was treated with HVLA, ME, FPR techniques in cervical, thoracic, rib,  lumbar and sacral areas  Patient tolerated the procedure well with improvement in symptoms  Patient given exercises, stretches and lifestyle modifications  See medications in patient instructions if given  Patient will follow up in 4-8 weeks 

## 2017-08-14 NOTE — Progress Notes (Signed)
Tawana ScaleZach Smith D.O. [facility] [address] [place], [place] [address] Phone: [phone] Subjective:    CC: neck pain follow-up  ACZ:[id number]:Subjective  Julia FloodJennifer Sanville is a 52 y.o. female coming in with complaint of neck pain.  Patient has posture and ergonomic difficulties.  Discussed with patient at great length about icing regimen and home exercises.  Patient has been increasing activity slowly.  Has noticed some more stiffness recently.     Past Medical History:  Diagnosis Date  . Asthma with allergic rhinitis   . Elevated liver enzymes   . Liver disease    Fatty liver  . Migraines   . Polycystic ovaries 1984  . Sleep apnea syndrome   . Subclinical hypothyroidism    Past Surgical History:  Procedure Laterality Date  . Laproscopy  2011  . LASER ABLATION  2012  . LITHOTRIPSY  2000   Social History   Socioeconomic History  . Marital status: Divorced    Spouse name: Not on file  . Number of children: Not on file  . Years of education: Not on file  . Highest education level: Not on file  Occupational History  . Not on file  Social Needs  . Financial resource strain: Not on file  . Food insecurity:    Worry: Not on file    Inability: Not on file  . Transportation needs:    Medical: Not on file    Non-medical: Not on file  Tobacco Use  . Smoking status: Never Smoker  . Smokeless tobacco: Never Used  Substance and Sexual Activity  . Alcohol use: No  . Drug use: No    Comment: Caffiene: None.  . Sexual activity: Not Currently    Partners: Male  Lifestyle  . Physical activity:    Days per week: Not on file    Minutes per session: Not on file  . Stress: Not on file  Relationships  . Social connections:    Talks on phone: Not on file    Gets together: Not on file    Attends religious service: Not on file    Active member of club or organization: Not on file    Attends meetings of clubs or organizations: Not on file    Relationship status: Not on file    Other Topics Concern  . Not on file  Social History Narrative  . Not on file   Allergies  Allergen Reactions  . Relafen [Nabumetone] Hives, Itching and Swelling  . Sulfa Antibiotics   . Clindamycin/Lincomycin Rash    Oral ONLY   Family History  Problem Relation Age of Onset  . Alcohol abuse Father   . Alcohol abuse Brother   . Coronary artery disease Maternal Grandmother   . Transient ischemic attack Maternal Grandmother   . Coronary artery disease Maternal Grandfather   . Heart attack Maternal Grandfather   . Transient ischemic attack Paternal Grandfather      Past medical history, social, surgical and family history all reviewed in electronic medical record.  No pertanent information unless stated regarding to the chief complaint.   Review of Systems:Review of systems updated and as accurate as of 08/15/17  No headache, visual changes, nausea, vomiting, diarrhea, constipation, dizziness, abdominal pain, skin rash, fevers, chills, night sweats, weight loss, swollen lymph nodes, body aches, joint swelling, muscle aches, chest pain, shortness of breath, mood changes.   Objective  Blood pressure 124/84, pulse 73, height 5\' 4"  (1.626 m), weight 185 lb (83.9 kg),  SpO2 96 %. Systems examined below as of 08/15/17   General: No apparent distress alert and oriented x3 mood and affect normal, dressed appropriately.  HEENT: Pupils equal, extraocular movements intact  Respiratory: Patient's speak in full sentences and does not appear short of breath  Cardiovascular: No lower extremity edema, non tender, no erythema  Skin: Warm dry intact with no signs of infection or rash on extremities or on axial skeleton.  Abdomen: Soft nontender  Neuro: Cranial nerves II through XII are intact, neurovascularly intact in all extremities with 2+ DTRs and 2+ pulses.  Lymph: No lymphadenopathy of posterior or anterior cervical chain or axillae bilaterally.  Gait normal with good balance and  coordination.  MSK:  Non tender with full range of motion and good stability and symmetric strength and tone of shoulders, elbows, wrist, hip, knee and ankles bilaterally.   Neck: Inspection loss of lordosis. No palpable stepoffs. Negative Spurling's maneuver. Limitation noted in all planes Grip strength and sensation normal in bilateral hands Strength good C4 to T1 distribution No sensory change to C4 to T1 Negative Hoffman sign bilaterally Reflexes normal Positive tightness of the right trapezius  Osteopathic findings Cervical C2 flexed rotated and side bent right C4 flexed rotated and side bent left C6 flexed rotated and side bent left T3 extended rotated and side bent right inhaled third rib T9 extended rotated and side bent left L1 flexed rotated and side bent right Sacrum right on right     Impression and Recommendations:     This case required medical decision making of moderate complexity.      Note: This dictation was prepared with Dragon dictation along with smaller phrase technology. Any transcriptional errors that result from this process are unintentional.

## 2017-08-15 ENCOUNTER — Encounter: Payer: Self-pay | Admitting: Family Medicine

## 2017-08-15 ENCOUNTER — Ambulatory Visit (INDEPENDENT_AMBULATORY_CARE_PROVIDER_SITE_OTHER): Payer: BLUE CROSS/BLUE SHIELD | Admitting: Family Medicine

## 2017-08-15 VITALS — BP 124/84 | HR 73 | Ht 64.0 in | Wt 185.0 lb

## 2017-08-15 DIAGNOSIS — M542 Cervicalgia: Secondary | ICD-10-CM

## 2017-08-15 DIAGNOSIS — M999 Biomechanical lesion, unspecified: Secondary | ICD-10-CM

## 2017-08-15 NOTE — Patient Instructions (Signed)
Good to see you  Julia [name] is your friend.  Stay active.  Try new orthotics or go back to our customs slowly.  Keep working on the posture Try the massage and I will write you  See me again in 3-6 weeks

## 2017-08-15 NOTE — Assessment & Plan Note (Signed)
Decision today to treat with OMT was based on Physical Exam  After verbal consent patient was treated with HVLA, ME, FPR techniques in cervical, thoracic, rib lumbar and sacral areas  Patient tolerated the procedure well with improvement in symptoms  Patient given exercises, stretches and lifestyle modifications  See medications in patient instructions if given  Patient will follow up in 4 weeks 

## 2017-08-15 NOTE — Assessment & Plan Note (Signed)
Mostly postural and ergonomics.  Responds well to manipulation.  No significant changes in medications or management at this time.  Patient does not have any pain that stops her from the activity.  Follow-up again in 4 weeks

## 2017-09-15 ENCOUNTER — Encounter: Payer: Self-pay | Admitting: Family Medicine

## 2017-09-25 NOTE — Progress Notes (Signed)
Tawana ScaleZach Saleem Coccia D.O. [facility] [address] [place], [place] [address] Phone: [phone] Subjective:     CC: Neck and back pain  UVO:[id number]:Subjective  Julia FloodJennifer Gerke is a 52 y.o. female coming in with complaint of neck and back pain. She has been having neck tightness and is having headaches due to the weather. She is here for OMT.  Patient has been doing relatively well with the conservative therapy.  Patient has not noticed any true triggers but is having some increasing discomfort of the head and neck     Past Medical History:  Diagnosis Date  . Asthma with allergic rhinitis   . Elevated liver enzymes   . Liver disease    Fatty liver  . Migraines   . Polycystic ovaries 1984  . Sleep apnea syndrome   . Subclinical hypothyroidism    Past Surgical History:  Procedure Laterality Date  . Laproscopy  2011  . LASER ABLATION  2012  . LITHOTRIPSY  2000   Social History   Socioeconomic History  . Marital status: Divorced    Spouse name: Not on file  . Number of children: Not on file  . Years of education: Not on file  . Highest education level: Not on file  Occupational History  . Not on file  Social Needs  . Financial resource strain: Not on file  . Food insecurity:    Worry: Not on file    Inability: Not on file  . Transportation needs:    Medical: Not on file    Non-medical: Not on file  Tobacco Use  . Smoking status: Never Smoker  . Smokeless tobacco: Never Used  Substance and Sexual Activity  . Alcohol use: No  . Drug use: No    Comment: Caffiene: None.  . Sexual activity: Not Currently    Partners: Male  Lifestyle  . Physical activity:    Days per week: Not on file    Minutes per session: Not on file  . Stress: Not on file  Relationships  . Social connections:    Talks on phone: Not on file    Gets together: Not on file    Attends religious service: Not on file    Active member of club or organization: Not on file    Attends meetings of  clubs or organizations: Not on file    Relationship status: Not on file  Other Topics Concern  . Not on file  Social History Narrative  . Not on file   Allergies  Allergen Reactions  . Relafen [Nabumetone] Hives, Itching and Swelling  . Sulfa Antibiotics   . Clindamycin/Lincomycin Rash    Oral ONLY   Family History  Problem Relation Age of Onset  . Alcohol abuse Father   . Alcohol abuse Brother   . Coronary artery disease Maternal Grandmother   . Transient ischemic attack Maternal Grandmother   . Coronary artery disease Maternal Grandfather   . Heart attack Maternal Grandfather   . Transient ischemic attack Paternal Grandfather      Past medical history, social, surgical and family history all reviewed in electronic medical record.  No pertanent information unless stated regarding to the chief complaint.   Review of Systems:Review of systems updated and as accurate as of 09/26/17  No headache, visual changes, nausea, vomiting, diarrhea, constipation, dizziness, abdominal pain, skin rash, fevers, chills, night sweats, weight loss, swollen lymph nodes, body aches, joint swelling,  chest pain, shortness of breath, mood changes.  Positive  muscle aches  Objective  Blood pressure 110/88, pulse 75, height 5\' 4"  (1.626 m), weight 185 lb (83.9 kg), SpO2 97 %. Systems examined below as of 09/26/17   General: No apparent distress alert and oriented x3 mood and affect normal, dressed appropriately.  HEENT: Pupils equal, extraocular movements intact  Respiratory: Patient's speak in full sentences and does not appear short of breath  Cardiovascular: No lower extremity edema, non tender, no erythema  Skin: Warm dry intact with no signs of infection or rash on extremities or on axial skeleton.  Abdomen: Soft nontender  Neuro: Cranial nerves II through XII are intact, neurovascularly intact in all extremities with 2+ DTRs and 2+ pulses.  Lymph: No lymphadenopathy of posterior or anterior  cervical chain or axillae bilaterally.  Gait normal with good balance and coordination.  MSK:  Non tender with full range of motion and good stability and symmetric strength and tone of shoulders, elbows, wrist, hip, knee and ankles bilaterally.  Neck: Inspection loss of lordosis. No palpable stepoffs. Negative Spurling's maneuver. Mild limitation of 5 degrees in all planes Grip strength and sensation normal in bilateral hands Strength good C4 to T1 distribution No sensory change to C4 to T1 Negative Hoffman sign bilaterally Reflexes normal Tightness of the trapezius bilaterally  Osteopathic findings C2 flexed rotated and side bent right C4 flexed rotated and side bent left C6 flexed rotated and side bent right  T3 extended rotated and side bent right inhaled third rib T9 extended rotated and side bent left L2 flexed rotated and side bent right Sacrum right on right     Impression and Recommendations:     This case required medical decision making of moderate complexity.      Note: This dictation was prepared with Dragon dictation along with smaller phrase technology. Any transcriptional errors that result from this process are unintentional.

## 2017-09-26 ENCOUNTER — Encounter: Payer: Self-pay | Admitting: Family Medicine

## 2017-09-26 ENCOUNTER — Ambulatory Visit (INDEPENDENT_AMBULATORY_CARE_PROVIDER_SITE_OTHER): Payer: BLUE CROSS/BLUE SHIELD | Admitting: Family Medicine

## 2017-09-26 VITALS — BP 110/88 | HR 75 | Ht 64.0 in | Wt 185.0 lb

## 2017-09-26 DIAGNOSIS — M999 Biomechanical lesion, unspecified: Secondary | ICD-10-CM | POA: Diagnosis not present

## 2017-09-26 DIAGNOSIS — M542 Cervicalgia: Secondary | ICD-10-CM | POA: Diagnosis not present

## 2017-09-26 NOTE — Assessment & Plan Note (Signed)
Decision today to treat with OMT was based on Physical Exam  After verbal consent patient was treated with HVLA, ME, FPR techniques in cervical, thoracic, rib, lumbar and sacral areas  Patient tolerated the procedure well with improvement in symptoms  Patient given exercises, stretches and lifestyle modifications  See medications in patient instructions if given  Patient will follow up in 4-6 weeks 

## 2017-09-26 NOTE — Patient Instructions (Signed)
Good to see you  Keep it up  See if there are any other triggers See me again in 4 weeks

## 2017-09-26 NOTE — Assessment & Plan Note (Signed)
Neck pain causing cervicogenic headaches.  Responds well to manipulation.  New techniques used today.  Concern about any worsening symptoms may need advanced imaging.  Continue the same medications.  Follow-up again in 4 to 6 weeks

## 2017-10-26 ENCOUNTER — Ambulatory Visit: Payer: Self-pay | Admitting: Family Medicine

## 2017-11-02 ENCOUNTER — Ambulatory Visit: Payer: Self-pay | Admitting: Family Medicine

## 2017-12-04 NOTE — Progress Notes (Signed)
[name] [address] [place], [place] [address] Phone: [phone] Subjective:   [name], am serving as a scribe for Dr. Antoine Primas.  I'm seeing this patient by the request  of:    CC: Neck pain  [id number]  Julia [name] is a 52 y.o. female coming in with complaint of neck and back pain. Has been having an increase in her neck pain over the past 2 weeks. She states that her pain is not as bad as last visit but does know that she needs an adjustment.  Has had significant amount of stress recently.  Patient's cousin did die of a massive stroke.  Has been trying to help with the affairs.  Stress at work as well.  Difficulty also with her daughter.  Although this seems to be exacerbating patient's chronic neck pain.  More popping and pain recently.  No significant radiation more than her usual intermittent portions.     Past Medical History:  Diagnosis Date  . Asthma with allergic rhinitis   . Elevated liver enzymes   . Liver disease    Fatty liver  . Migraines   . Polycystic ovaries 1984  . Sleep apnea syndrome   . Subclinical hypothyroidism    Past Surgical History:  Procedure Laterality Date  . Laproscopy  2011  . LASER ABLATION  2012  . LITHOTRIPSY  2000   Social History   Socioeconomic History  . Marital status: Divorced    Spouse name: Not on file  . Number of children: Not on file  . Years of education: Not on file  . Highest education level: Not on file  Occupational History  . Not on file  Social Needs  . Financial resource strain: Not on file  . Food insecurity:    Worry: Not on file    Inability: Not on file  . Transportation needs:    Medical: Not on file    Non-medical: Not on file  Tobacco Use  . Smoking status: Never Smoker  . Smokeless tobacco: Never Used  Substance and Sexual Activity  . Alcohol use: No  . Drug use: No    Comment: Caffiene: None.  . Sexual activity: Not Currently    Partners:  Male  Lifestyle  . Physical activity:    Days per week: Not on file    Minutes per session: Not on file  . Stress: Not on file  Relationships  . Social connections:    Talks on phone: Not on file    Gets together: Not on file    Attends religious service: Not on file    Active member of club or organization: Not on file    Attends meetings of clubs or organizations: Not on file    Relationship status: Not on file  Other Topics Concern  . Not on file  Social History Narrative  . Not on file   Allergies  Allergen Reactions  . Relafen [Nabumetone] Hives, Itching and Swelling  . Sulfa Antibiotics   . Clindamycin/Lincomycin Rash    Oral ONLY   Family History  Problem Relation Age of Onset  . Alcohol abuse Father   . Alcohol abuse Brother   . Coronary artery disease Maternal Grandmother   . Transient ischemic attack Maternal Grandmother   . Coronary artery disease Maternal Grandfather   . Heart attack Maternal Grandfather   . Transient ischemic attack Paternal Grandfather     Current Outpatient Medications (Endocrine & Metabolic):  .  levothyroxine (SYNTHROID) 25 MCG tablet, Take 25 mcg by mouth daily before breakfast. .  thyroid (ARMOUR) 60 MG tablet, Take 60 mg by mouth daily before breakfast.   Current Outpatient Medications (Respiratory):  .  albuterol (PROVENTIL HFA;VENTOLIN HFA) 108 (90 BASE) MCG/ACT inhaler, Inhale 2 puffs into the lungs every 6 (six) hours as needed. prn  .  levocetirizine (XYZAL) 5 MG tablet, Take 5 mg by mouth every evening. .  mometasone (ASMANEX) 220 MCG/INH inhaler, Inhale 1 puff into the lungs daily. Prescribed by Dr. Tempie Donning. .  montelukast (SINGULAIR) 10 MG tablet, Take 10 mg by mouth Daily. .  promethazine (PHENERGAN) 25 MG tablet, Take 1 tablet (25 mg total) by mouth every 6 (six) hours as needed for nausea or vomiting. .  SYMBICORT 80-4.5 MCG/ACT inhaler, Inhale 2 puffs into the lungs 2 (two) times daily.  Current Outpatient Medications  (Analgesics):  .  zolmitriptan (ZOMIG) 5 MG tablet, Take 5 mg by mouth as needed. prn   Current Outpatient Medications (Hematological):  Marland Kitchen  Cyanocobalamin 5000 MCG SUBL, Place under the tongue.  Current Outpatient Medications (Other):  Marland Kitchen  5-Hydroxytryptophan (5-HTP) 100 MG CAPS, Take 1 capsule by mouth 2 (two) times daily.  .  Ascorbic Acid (VITAMIN C) 1000 MG tablet, Take 1,000 mg by mouth daily. .  Biotin 800 MCG TABS, Take 1 tablet by mouth daily. Marland Kitchen  buPROPion (WELLBUTRIN XL) 150 MG 24 hr tablet, Take 1 tablet (150 mg total) by mouth daily. .  Cholecalciferol (VITAMIN D) 2000 units tablet, Take 5,000 Units by mouth daily. MWF 10,000 IU .  Coenzyme Q10 (CO Q-10) 200 MG CAPS, Take by mouth daily.  .  diazepam (VALIUM) 5 MG tablet, Take 1 tablet (5 mg total) by mouth every 12 (twelve) hours as needed. One tab by mouth, 2 hours before procedure. .  Diclofenac Sodium 2 % SOLN, Fingertip amount to affected area twice daily .  EVENING PRIMROSE OIL PO, Take 1.3 g by mouth daily. .  hydrOXYzine (ATARAX/VISTARIL) 25 MG tablet, Take 1 tablet (25 mg total) by mouth 3 (three) times daily as needed. Marland Kitchen  L-Methylfolate (DEPLIN PO), Take 400 mcg by mouth 2 (two) times daily. Marland Kitchen  MAGNESIUM CITRATE PO*, Take by mouth. .  metaxalone (SKELAXIN) 800 MG tablet, Take 800 mg by mouth as needed. .  metoCLOPramide (REGLAN) 10 MG tablet, Take 10 mg by mouth 4 (four) times daily. .  Multiple Vitamins-Minerals (ZINC PO), Take 1 capsule by mouth 2 (two) times daily. .  Omega-3 Fatty Acids (FISH OIL) 300 MG CAPS, Take 900 mg by mouth daily.  Marland Kitchen  topiramate (TOPAMAX) 25 MG tablet, Take 75 mg by mouth at bedtime. .  vitamin A 95284 UNIT capsule, Take 25,000 Units by mouth 3 (three) times a week.  .  vitamin E 400 UNIT capsule, Take 400 Units by mouth every other day. * These medications belong to multiple therapeutic classes and are listed under each applicable group.    Past medical history, social, surgical and  family history all reviewed in electronic medical record.  No pertanent information unless stated regarding to the chief complaint.   Review of Systems:  No  visual changes, nausea, vomiting, diarrhea, constipation, dizziness, abdominal pain, skin rash, fevers, chills, night sweats, weight loss, swollen lymph nodes, body aches, joint swelling,  chest pain, shortness of breath, mood changes.  Positive muscle aches and headache  Objective  Blood pressure 120/86, pulse 80, height 5\' 4"  (1.626 m), weight 184 lb (83.5 kg),  SpO2 98 %.    General: No apparent distress alert and oriented x3 mood and affect normal, dressed appropriately.  HEENT: Pupils equal, extraocular movements intact  Respiratory: Patient's speak in full sentences and does not appear short of breath  Cardiovascular: No lower extremity edema, non tender, no erythema  Skin: Warm dry intact with no signs of infection or rash on extremities or on axial skeleton.  Abdomen: Soft nontender  Neuro: Cranial nerves II through XII are intact, neurovascularly intact in all extremities with 2+ DTRs and 2+ pulses.  Lymph: No lymphadenopathy of posterior or anterior cervical chain or axillae bilaterally.  Gait normal with good balance and coordination.  MSK:  Non tender with full range of motion and good stability and symmetric strength and tone of shoulders, elbows, wrist, hip, knee and ankles bilaterally.  Neck: Inspection unremarkable. No palpable stepoffs. Negative Spurling's maneuver. Loss of range of motion in all planes by 5 to 10 degrees Grip strength and sensation normal in bilateral hands Strength good C4 to T1 distribution No sensory change to C4 to T1 Negative Hoffman sign bilaterally Reflexes normal Tightness of the trapezius bilaterally as well as increased kyphosis of the thoracic spine  Osteopathic findings C2 flexed rotated and side bent right C4 flexed rotated and side bent left C6 flexed rotated and side bent right    T2 extended rotated and side bent right inhaled rib T5 extended rotated and side bent left L2 flexed rotated and side bent right Sacrum right on right    Impression and Recommendations:     This case required medical decision making of moderate complexity. The above documentation has been reviewed and is accurate and complete Judi Saa, DO       Note: This dictation was prepared with Dragon dictation along with smaller phrase technology. Any transcriptional errors that result from this process are unintentional.

## 2017-12-05 ENCOUNTER — Ambulatory Visit (INDEPENDENT_AMBULATORY_CARE_PROVIDER_SITE_OTHER): Payer: BLUE CROSS/BLUE SHIELD | Admitting: Family Medicine

## 2017-12-05 ENCOUNTER — Encounter: Payer: Self-pay | Admitting: Family Medicine

## 2017-12-05 VITALS — BP 120/86 | HR 80 | Ht 64.0 in | Wt 184.0 lb

## 2017-12-05 DIAGNOSIS — M999 Biomechanical lesion, unspecified: Secondary | ICD-10-CM | POA: Diagnosis not present

## 2017-12-05 DIAGNOSIS — M542 Cervicalgia: Secondary | ICD-10-CM | POA: Diagnosis not present

## 2017-12-05 NOTE — Assessment & Plan Note (Signed)
Worsening tightness.  Does have some significant muscle imbalances.  Patient does have significant difficulty with posture as well.  Discussed with patient about icing regimen and home exercises.  Discussed which activities to do which wants to avoid.  Follow-up again with me in 4 to 8 weeks

## 2017-12-05 NOTE — Patient Instructions (Signed)
Good to see you  Julia [name] is your friend  Stay active Try to find time for yourself If you need [place] sooner call [phone] See me again in 3-4 weeks

## 2017-12-05 NOTE — Assessment & Plan Note (Signed)
Decision today to treat with OMT was based on Physical Exam  After verbal consent patient was treated with HVLA, ME, FPR techniques in cervical, thoracic, rib,  lumbar and sacral areas  Patient tolerated the procedure well with improvement in symptoms  Patient given exercises, stretches and lifestyle modifications  See medications in patient instructions if given  Patient will follow up in 4-8 weeks 

## 2017-12-11 ENCOUNTER — Encounter: Payer: Self-pay | Admitting: Family Medicine

## 2017-12-11 ENCOUNTER — Other Ambulatory Visit: Payer: Self-pay

## 2017-12-11 MED ORDER — DICLOFENAC SODIUM 2 % TD SOLN: 2.0000 g | Freq: Two times a day (BID) | TRANSDERMAL | 3 refills | Status: DC

## 2017-12-28 ENCOUNTER — Ambulatory Visit: Payer: Self-pay | Admitting: Family Medicine

## 2018-01-09 NOTE — Progress Notes (Signed)
[name] [address] [place], [place] [address] Phone: [phone] Subjective:     CC: Neck pain follow-up  [id number]  Julia [name] is a 52 y.o. female coming in with complaint of neck pain. She said that she is no worse but had been using her arms a lot with raking leaves. Is having tightness in her cervical spine.    Past Medical History:  Diagnosis Date  . Asthma with allergic rhinitis   . Elevated liver enzymes   . Liver disease    Fatty liver  . Migraines   . Polycystic ovaries 1984  . Sleep apnea syndrome   . Subclinical hypothyroidism    Past Surgical History:  Procedure Laterality Date  . Laproscopy  2011  . LASER ABLATION  2012  . LITHOTRIPSY  2000   Social History   Socioeconomic History  . Marital status: Divorced    Spouse name: Not on file  . Number of children: Not on file  . Years of education: Not on file  . Highest education level: Not on file  Occupational History  . Not on file  Social Needs  . Financial resource strain: Not on file  . Food insecurity:    Worry: Not on file    Inability: Not on file  . Transportation needs:    Medical: Not on file    Non-medical: Not on file  Tobacco Use  . Smoking status: Never Smoker  . Smokeless tobacco: Never Used  Substance and Sexual Activity  . Alcohol use: No  . Drug use: No    Comment: Caffiene: None.  . Sexual activity: Not Currently    Partners: Male  Lifestyle  . Physical activity:    Days per week: Not on file    Minutes per session: Not on file  . Stress: Not on file  Relationships  . Social connections:    Talks on phone: Not on file    Gets together: Not on file    Attends religious service: Not on file    Active member of club or organization: Not on file    Attends meetings of clubs or organizations: Not on file    Relationship status: Not on file  Other Topics Concern  . Not on file  Social History Narrative  . Not on file    Allergies  Allergen Reactions  . Relafen [Nabumetone] Hives, Itching and Swelling  . Sulfa Antibiotics   . Clindamycin/Lincomycin Rash    Oral ONLY   Family History  Problem Relation Age of Onset  . Alcohol abuse Father   . Alcohol abuse Brother   . Coronary artery disease Maternal Grandmother   . Transient ischemic attack Maternal Grandmother   . Coronary artery disease Maternal Grandfather   . Heart attack Maternal Grandfather   . Transient ischemic attack Paternal Grandfather     Current Outpatient Medications (Endocrine & Metabolic):  .  levothyroxine (SYNTHROID) 25 MCG tablet, Take 25 mcg by mouth daily before breakfast. .  thyroid (ARMOUR) 60 MG tablet, Take 60 mg by mouth daily before breakfast.   Current Outpatient Medications (Respiratory):  .  albuterol (PROVENTIL HFA;VENTOLIN HFA) 108 (90 BASE) MCG/ACT inhaler, Inhale 2 puffs into the lungs every 6 (six) hours as needed. prn  .  levocetirizine (XYZAL) 5 MG tablet, Take 5 mg by mouth every evening. .  mometasone (ASMANEX) 220 MCG/INH inhaler, Inhale 1 puff into the lungs daily. Prescribed by Dr. Tempie Donning. .  montelukast (SINGULAIR)  10 MG tablet, Take 10 mg by mouth Daily. .  promethazine (PHENERGAN) 25 MG tablet, Take 1 tablet (25 mg total) by mouth every 6 (six) hours as needed for nausea or vomiting. .  SYMBICORT 80-4.5 MCG/ACT inhaler, Inhale 2 puffs into the lungs 2 (two) times daily.  Current Outpatient Medications (Analgesics):  .  zolmitriptan (ZOMIG) 5 MG tablet, Take 5 mg by mouth as needed. prn   Current Outpatient Medications (Hematological):  Marland Kitchen  Cyanocobalamin 5000 MCG SUBL, Place under the tongue.  Current Outpatient Medications (Other):  Marland Kitchen  5-Hydroxytryptophan (5-HTP) 100 MG CAPS, Take 1 capsule by mouth 2 (two) times daily.  .  Ascorbic Acid (VITAMIN C) 1000 MG tablet, Take 1,000 mg by mouth daily. .  Biotin 800 MCG TABS, Take 1 tablet by mouth daily. Marland Kitchen  buPROPion (WELLBUTRIN XL) 150 MG 24 hr  tablet, Take 1 tablet (150 mg total) by mouth daily. .  Cholecalciferol (VITAMIN D) 2000 units tablet, Take 5,000 Units by mouth daily. MWF 10,000 IU .  Coenzyme Q10 (CO Q-10) 200 MG CAPS, Take by mouth daily.  .  diazepam (VALIUM) 5 MG tablet, Take 1 tablet (5 mg total) by mouth every 12 (twelve) hours as needed. One tab by mouth, 2 hours before procedure. .  Diclofenac Sodium (PENNSAID) 2 % SOLN, Place 2 g onto the skin 2 (two) times daily. .  Diclofenac Sodium 2 % SOLN, Fingertip amount to affected area twice daily .  EVENING PRIMROSE OIL PO, Take 1.3 g by mouth daily. .  hydrOXYzine (ATARAX/VISTARIL) 25 MG tablet, Take 1 tablet (25 mg total) by mouth 3 (three) times daily as needed. Marland Kitchen  L-Methylfolate (DEPLIN PO), Take 400 mcg by mouth 2 (two) times daily. Marland Kitchen  MAGNESIUM CITRATE PO*, Take by mouth. .  metaxalone (SKELAXIN) 800 MG tablet, Take 800 mg by mouth as needed. .  metoCLOPramide (REGLAN) 10 MG tablet, Take 10 mg by mouth 4 (four) times daily. .  Multiple Vitamins-Minerals (ZINC PO), Take 1 capsule by mouth 2 (two) times daily. .  Omega-3 Fatty Acids (FISH OIL) 300 MG CAPS, Take 900 mg by mouth daily.  Marland Kitchen  topiramate (TOPAMAX) 25 MG tablet, Take 75 mg by mouth at bedtime. .  vitamin A 19147 UNIT capsule, Take 25,000 Units by mouth 3 (three) times a week.  .  vitamin E 400 UNIT capsule, Take 400 Units by mouth every other day. * These medications belong to multiple therapeutic classes and are listed under each applicable group.    Past medical history, social, surgical and family history all reviewed in electronic medical record.  No pertanent information unless stated regarding to the chief complaint.   Review of Systems:  No headache, visual changes, nausea, vomiting, diarrhea, constipation, dizziness, abdominal pain, skin rash, fevers, chills, night sweats, weight loss, swollen lymph nodes, body aches, joint swelling, muscle aches, chest pain, shortness of breath, mood changes.    Objective  Blood pressure 124/88, pulse 77, height 5\' 4"  (1.626 m), weight 184 lb (83.5 kg), SpO2 98 %.   General: No apparent distress alert and oriented x3 mood and affect normal, dressed appropriately.  HEENT: Pupils equal, extraocular movements intact  Respiratory: Patient's speak in full sentences and does not appear short of breath  Cardiovascular: No lower extremity edema, non tender, no erythema  Skin: Warm dry intact with no signs of infection or rash on extremities or on axial skeleton.  Abdomen: Soft nontender  Neuro: Cranial nerves II through XII are intact, neurovascularly  intact in all extremities with 2+ DTRs and 2+ pulses.  Lymph: No lymphadenopathy of posterior or anterior cervical chain or axillae bilaterally.  Gait normal with good balance and coordination.  MSK:  Non tender with full range of motion and good stability and symmetric strength and tone of shoulders, elbows, wrist, hip, knee and ankles bilaterally.  Mild arthritic changes of multiple joints Neck exam shows loss of lordosis.  Mild increase in kyphosis of the upper thoracic spine.  Patient does have limited side bending and rotation of the neck bilaterally.  Negative Spurling's.  Neurovascular intact in all extremities.  Osteopathic findings C2 flexed rotated and side bent right T3 extended rotated and side bent right inhaled third rib T9 extended rotated and side bent left L2 flexed rotated and side bent right Sacrum right on right     Impression and Recommendations:     This case required medical decision making of moderate complexity. The above documentation has been reviewed and is accurate and complete Judi Saa, DO       Note: This dictation was prepared with Dragon dictation along with smaller phrase technology. Any transcriptional errors that result from this process are unintentional.

## 2018-01-11 ENCOUNTER — Encounter: Payer: Self-pay | Admitting: Family Medicine

## 2018-01-11 ENCOUNTER — Ambulatory Visit (INDEPENDENT_AMBULATORY_CARE_PROVIDER_SITE_OTHER): Payer: BLUE CROSS/BLUE SHIELD | Admitting: Family Medicine

## 2018-01-11 VITALS — BP 124/88 | HR 77 | Ht 64.0 in | Wt 184.0 lb

## 2018-01-11 DIAGNOSIS — M999 Biomechanical lesion, unspecified: Secondary | ICD-10-CM | POA: Diagnosis not present

## 2018-01-11 DIAGNOSIS — M542 Cervicalgia: Secondary | ICD-10-CM

## 2018-01-11 NOTE — Assessment & Plan Note (Signed)
Once again multifactorial.  Seems to be in good periods otherwise.  Discussed icing regimen and home exercises.  Discussed topical anti-inflammatories.  Patient has different medications for muscle relaxers as well including Valium.  Follow-up with me again 4 to 8 weeks.

## 2018-01-11 NOTE — Assessment & Plan Note (Signed)
Decision today to treat with OMT was based on Physical Exam  After verbal consent patient was treated with HVLA, ME, FPR techniques in cervical, thoracic, rib,  lumbar and sacral areas  Patient tolerated the procedure well with improvement in symptoms  Patient given exercises, stretches and lifestyle modifications  See medications in patient instructions if given  Patient will follow up in 4-8 weeks 

## 2018-01-11 NOTE — Patient Instructions (Signed)
Good to see you  Julia [name] is your friend Stay active except with the yard ;) See me again in 6 weeks

## 2018-02-03 ENCOUNTER — Encounter: Payer: Self-pay | Admitting: Family Medicine

## 2018-02-13 NOTE — Progress Notes (Signed)
[name] [address] [place], [place] [address] Phone: [phone] Subjective:   [name], am serving as a scribe for Dr. Antoine Primas.    CC: Back and hip pain, right ankle pain  [id number]  Julia [name] is a 52 y.o. female coming in with complaint of back and hip pain. Patient states that right hip pain is worse than left. Pain in the glute, right side. Denies any radiating pain.   She also has been having pain over the medial aspect of right ankle. Felt a pop when getting out of bed. Does have intermittent swelling. Saturday she had an increase in her pain.  Patient is noticed that is been swelling more.  Also been having more pain.  Does not remember any new injury though.      Past Medical History:  Diagnosis Date  . Asthma with allergic rhinitis   . Elevated liver enzymes   . Liver disease    Fatty liver  . Migraines   . Polycystic ovaries 1984  . Sleep apnea syndrome   . Subclinical hypothyroidism    Past Surgical History:  Procedure Laterality Date  . Laproscopy  2011  . LASER ABLATION  2012  . LITHOTRIPSY  2000   Social History   Socioeconomic History  . Marital status: Divorced    Spouse name: Not on file  . Number of children: Not on file  . Years of education: Not on file  . Highest education level: Not on file  Occupational History  . Not on file  Social Needs  . Financial resource strain: Not on file  . Food insecurity:    Worry: Not on file    Inability: Not on file  . Transportation needs:    Medical: Not on file    Non-medical: Not on file  Tobacco Use  . Smoking status: Never Smoker  . Smokeless tobacco: Never Used  Substance and Sexual Activity  . Alcohol use: No  . Drug use: No    Comment: Caffiene: None.  . Sexual activity: Not Currently    Partners: Male  Lifestyle  . Physical activity:    Days per week: Not on file    Minutes per session: Not on file  . Stress: Not on file    Relationships  . Social connections:    Talks on phone: Not on file    Gets together: Not on file    Attends religious service: Not on file    Active member of club or organization: Not on file    Attends meetings of clubs or organizations: Not on file    Relationship status: Not on file  Other Topics Concern  . Not on file  Social History Narrative  . Not on file   Allergies  Allergen Reactions  . Relafen [Nabumetone] Hives, Itching and Swelling  . Sulfa Antibiotics   . Clindamycin/Lincomycin Rash    Oral ONLY   Family History  Problem Relation Age of Onset  . Alcohol abuse Father   . Alcohol abuse Brother   . Coronary artery disease Maternal Grandmother   . Transient ischemic attack Maternal Grandmother   . Coronary artery disease Maternal Grandfather   . Heart attack Maternal Grandfather   . Transient ischemic attack Paternal Grandfather     Current Outpatient Medications (Endocrine & Metabolic):  .  levothyroxine (SYNTHROID) 25 MCG tablet, Take 25 mcg by mouth daily before breakfast. .  thyroid (ARMOUR) 60 MG tablet, Take  60 mg by mouth daily before breakfast.   Current Outpatient Medications (Respiratory):  .  albuterol (PROVENTIL HFA;VENTOLIN HFA) 108 (90 BASE) MCG/ACT inhaler, Inhale 2 puffs into the lungs every 6 (six) hours as needed. prn  .  levocetirizine (XYZAL) 5 MG tablet, Take 5 mg by mouth every evening. .  mometasone (ASMANEX) 220 MCG/INH inhaler, Inhale 1 puff into the lungs daily. Prescribed by Dr. Tempie Donning. .  montelukast (SINGULAIR) 10 MG tablet, Take 10 mg by mouth Daily. .  promethazine (PHENERGAN) 25 MG tablet, Take 1 tablet (25 mg total) by mouth every 6 (six) hours as needed for nausea or vomiting. .  SYMBICORT 80-4.5 MCG/ACT inhaler, Inhale 2 puffs into the lungs 2 (two) times daily.  Current Outpatient Medications (Analgesics):  .  zolmitriptan (ZOMIG) 5 MG tablet, Take 5 mg by mouth as needed. prn   Current Outpatient Medications  (Hematological):  Marland Kitchen  Cyanocobalamin 5000 MCG SUBL, Place under the tongue.  Current Outpatient Medications (Other):  Marland Kitchen  5-Hydroxytryptophan (5-HTP) 100 MG CAPS, Take 1 capsule by mouth 2 (two) times daily.  .  Ascorbic Acid (VITAMIN C) 1000 MG tablet, Take 1,000 mg by mouth daily. .  Biotin 800 MCG TABS, Take 1 tablet by mouth daily. Marland Kitchen  buPROPion (WELLBUTRIN XL) 150 MG 24 hr tablet, Take 1 tablet (150 mg total) by mouth daily. .  Cholecalciferol (VITAMIN D) 2000 units tablet, Take 5,000 Units by mouth daily. MWF 10,000 IU .  Coenzyme Q10 (CO Q-10) 200 MG CAPS, Take by mouth daily.  .  diazepam (VALIUM) 5 MG tablet, Take 1 tablet (5 mg total) by mouth every 12 (twelve) hours as needed. One tab by mouth, 2 hours before procedure. .  Diclofenac Sodium (PENNSAID) 2 % SOLN, Place 2 g onto the skin 2 (two) times daily. .  Diclofenac Sodium 2 % SOLN, Fingertip amount to affected area twice daily .  EVENING PRIMROSE OIL PO, Take 1.3 g by mouth daily. .  hydrOXYzine (ATARAX/VISTARIL) 25 MG tablet, Take 1 tablet (25 mg total) by mouth 3 (three) times daily as needed. Marland Kitchen  L-Methylfolate (DEPLIN PO), Take 400 mcg by mouth 2 (two) times daily. Marland Kitchen  MAGNESIUM CITRATE PO*, Take by mouth. .  metaxalone (SKELAXIN) 800 MG tablet, Take 800 mg by mouth as needed. .  metoCLOPramide (REGLAN) 10 MG tablet, Take 10 mg by mouth 4 (four) times daily. .  Multiple Vitamins-Minerals (ZINC PO), Take 1 capsule by mouth 2 (two) times daily. .  Omega-3 Fatty Acids (FISH OIL) 300 MG CAPS, Take 900 mg by mouth daily.  Marland Kitchen  topiramate (TOPAMAX) 25 MG tablet, Take 75 mg by mouth at bedtime. .  vitamin A 60454 UNIT capsule, Take 25,000 Units by mouth 3 (three) times a week.  .  vitamin E 400 UNIT capsule, Take 400 Units by mouth every other day. * These medications belong to multiple therapeutic classes and are listed under each applicable group.    Past medical history, social, surgical and family history all reviewed in  electronic medical record.  No pertanent information unless stated regarding to the chief complaint.   Review of Systems:  No headache, visual changes, nausea, vomiting, diarrhea, constipation, dizziness, abdominal pain, skin rash, fevers, chills, night sweats, weight loss, swollen lymph nodes, body aches, joint swelling, muscle aches, chest pain, shortness of breath, mood changes.   Objective  There were no vitals taken for this visit. Systems examined below as of    General: No apparent distress alert and  oriented x3 mood and affect normal, dressed appropriately.  HEENT: Pupils equal, extraocular movements intact  Respiratory: Patient's speak in full sentences and does not appear short of breath  Cardiovascular: No lower extremity edema, non tender, no erythema  Skin: Warm dry intact with no signs of infection or rash on extremities or on axial skeleton.  Abdomen: Soft nontender  Neuro: Cranial nerves II through XII are intact, neurovascularly intact in all extremities with 2+ DTRs and 2+ pulses.  Lymph: No lymphadenopathy of posterior or anterior cervical chain or axillae bilaterally.  Gait normal with good balance and coordination.  MSK:  Non tender with full range of motion and good stability and symmetric strength and tone of shoulders, elbows, wrist, hip, knee bilaterally.   Ankle: Right Patient does have some swelling over the medial malleolus and does have some abnormality compared to the contralateral side Strength is 5/5 in all directions. Stable lateral and medial ligaments; squeeze test and kleiger test unremarkable; Talar dome nontender; Mild tenderness surrounding posterior tibialis as well but more over the medial malleolus. No sign of peroneal tendon subluxations or tenderness to palpation Negative tarsal tunnel tinel's Able to walk 4 steps. Severe pes planus bilaterally  MSK US performed of: Right ankle This study was ordered, performed, and interpreted by Terrilee FilesZach Yahsir Wickens  D.O.  Foot/Ankle:   Right medial malleolus does have thinning of the cortex noted.  Possible cortical defect noted that could be either stress reaction or a nonhealing fracture.  Some mild calcific changes within the medial ankle joint.  IMPRESSION: Patient does have some abnormality of the medial malleolus  Osteopathic findings C2 flexed rotated and side bent right C4 flexed rotated and side bent left C6 flexed rotated and side bent left T3 extended rotated and side bent right inhaled third rib T9 extended rotated and side bent left L2 flexed rotated and side bent right Sacrum right on right      Impression and Recommendations:     This case required medical decision making of moderate complexity. The above documentation has been reviewed and is accurate and complete Judi SaaZachary M Nicloe Frontera, DO       Note: This dictation was prepared with Dragon dictation along with smaller phrase technology. Any transcriptional errors that result from this process are unintentional.

## 2018-02-14 ENCOUNTER — Encounter: Payer: Self-pay | Admitting: Family Medicine

## 2018-02-14 ENCOUNTER — Ambulatory Visit (INDEPENDENT_AMBULATORY_CARE_PROVIDER_SITE_OTHER): Payer: BLUE CROSS/BLUE SHIELD | Admitting: Family Medicine

## 2018-02-14 ENCOUNTER — Ambulatory Visit (INDEPENDENT_AMBULATORY_CARE_PROVIDER_SITE_OTHER)
Admission: RE | Admit: 2018-02-14 | Discharge: 2018-02-14 | Disposition: A | Discharge status: Home / Self Care | Payer: BLUE CROSS/BLUE SHIELD | Source: Ambulatory Visit | Attending: Family Medicine | Admitting: Family Medicine

## 2018-02-14 ENCOUNTER — Ambulatory Visit: Payer: Self-pay

## 2018-02-14 VITALS — BP 118/98 | HR 76 | Ht 64.0 in | Wt 186.0 lb

## 2018-02-14 DIAGNOSIS — M25572 Pain in left ankle and joints of left foot: Secondary | ICD-10-CM

## 2018-02-14 DIAGNOSIS — M542 Cervicalgia: Secondary | ICD-10-CM | POA: Diagnosis not present

## 2018-02-14 DIAGNOSIS — M999 Biomechanical lesion, unspecified: Secondary | ICD-10-CM | POA: Diagnosis not present

## 2018-02-14 DIAGNOSIS — G8929 Other chronic pain: Secondary | ICD-10-CM | POA: Diagnosis not present

## 2018-02-14 DIAGNOSIS — M25571 Pain in right ankle and joints of right foot: Secondary | ICD-10-CM | POA: Insufficient documentation

## 2018-02-14 MED ORDER — VITAMIN D (ERGOCALCIFEROL) 1.25 MG (50000 UNIT) PO CAPS: 50000.0000 [IU] | ORAL_CAPSULE | ORAL | 0 refills | Status: DC

## 2018-02-14 NOTE — Assessment & Plan Note (Signed)
Right ankle pain.  Ultrasound shows potentially some chronic changes to the bone.  We discussed the possibility of bone density, Aircast given today, x-rays pending.  Discussed topical anti-inflammatories and icing regimen.  Follow-up again in 4 weeks

## 2018-02-14 NOTE — Assessment & Plan Note (Signed)
Decision today to treat with OMT was based on Physical Exam  After verbal consent patient was treated with HVLA, ME, FPR techniques in cervical, thoracic, l, ribumbar and sacral areas  Patient tolerated the procedure well with improvement in symptoms  Patient given exercises, stretches and lifestyle modifications  See medications in patient instructions if given  Patient will follow up in 4-6 weeks

## 2018-02-14 NOTE — Assessment & Plan Note (Signed)
Mostly postural.  Discussed icing regimen and home exercise.  Patient has been doing these occasionally.  Somewhat noncompliant with some of the treatment options we have given her.  Discussed which activities to do which wants to avoid.  Follow-up again in 4 weeks

## 2018-02-14 NOTE — Patient Instructions (Addendum)
Good to see you  Ankle xray downstairs Colchicine 2 times a day for 3 days  Wear aircast daily for 2-3 weeks Once weekly vitamin D instead of daily  Make an appointment for bone density up front  See me again in 3-4 weeks

## 2018-02-15 ENCOUNTER — Encounter: Payer: Self-pay | Admitting: Family Medicine

## 2018-02-15 ENCOUNTER — Other Ambulatory Visit: Payer: Self-pay

## 2018-02-15 ENCOUNTER — Telehealth: Payer: Self-pay

## 2018-02-15 DIAGNOSIS — M545 Low back pain, unspecified: Secondary | ICD-10-CM

## 2018-02-15 DIAGNOSIS — G8929 Other chronic pain: Secondary | ICD-10-CM

## 2018-02-15 NOTE — Telephone Encounter (Signed)
Copied from CRM [id number]. Topic: General - Other >> Feb 15, 2018  9:09 AM Gaynelle AduPoole, Shalonda wrote: Reason for CRM:  patient is calling to stated she was advised to schedule a bone density test and forgot she is requesting a call back at   [phone] >> Feb 15, 2018  9:19 AM Claris PongBlackwood, Samantha J wrote: DEXA has not been ordered, please order and I can call and schedule.

## 2018-02-15 NOTE — Telephone Encounter (Signed)
LVM for patient to call back and schedule DEXA

## 2018-02-16 ENCOUNTER — Ambulatory Visit (INDEPENDENT_AMBULATORY_CARE_PROVIDER_SITE_OTHER)
Admission: RE | Admit: 2018-02-16 | Discharge: 2018-02-16 | Disposition: A | Discharge status: Home / Self Care | Payer: BLUE CROSS/BLUE SHIELD | Source: Ambulatory Visit | Attending: Adult Health | Admitting: Adult Health

## 2018-02-16 ENCOUNTER — Encounter: Payer: Self-pay | Admitting: Radiology

## 2018-02-16 DIAGNOSIS — G8929 Other chronic pain: Secondary | ICD-10-CM | POA: Diagnosis not present

## 2018-02-16 DIAGNOSIS — M545 Low back pain: Secondary | ICD-10-CM | POA: Diagnosis not present

## 2018-02-16 DIAGNOSIS — Z13820 Encounter for screening for osteoporosis: Secondary | ICD-10-CM

## 2018-02-18 DIAGNOSIS — Z13820 Encounter for screening for osteoporosis: Secondary | ICD-10-CM | POA: Diagnosis not present

## 2018-02-18 DIAGNOSIS — M545 Low back pain: Secondary | ICD-10-CM | POA: Diagnosis not present

## 2018-02-18 DIAGNOSIS — G8929 Other chronic pain: Secondary | ICD-10-CM | POA: Diagnosis not present

## 2018-02-22 ENCOUNTER — Ambulatory Visit: Payer: Self-pay | Admitting: Family Medicine

## 2018-03-12 ENCOUNTER — Encounter: Payer: Self-pay | Admitting: Family Medicine

## 2018-03-14 NOTE — Progress Notes (Signed)
Julia [name] D.O. [facility] [address] [place], [place] [address] Phone: [phone] Subjective:    I Julia [name] am serving as a Neurosurgeonscribe for Dr. Antoine PrimasZachary [name].    CC: Left ankle pain  BJY:[id number]:Subjective  Julia [name] is a 53 y.o. female coming in with complaint of left ankle pain. States that she still gets pain with ADL and working. Standing about 10-12 hours.  Patient states that has been trying to switch shoes but not noticing any significant improvement.  Rates the severity of pain sometimes is 8 out of 10.  Sometimes stops from complaints activity.  Patient notices a swelling that seems to get worse after long amount of time.      Past Medical History:  Diagnosis Date  . Asthma with allergic rhinitis   . Elevated liver enzymes   . Liver disease    Fatty liver  . Migraines   . Polycystic ovaries 1984  . Sleep apnea syndrome   . Subclinical hypothyroidism    Past Surgical History:  Procedure Laterality Date  . Laproscopy  2011  . LASER ABLATION  2012  . LITHOTRIPSY  2000   Social History   Socioeconomic History  . Marital status: Divorced    Spouse name: Not on file  . Number of children: Not on file  . Years of education: Not on file  . Highest education level: Not on file  Occupational History  . Not on file  Social Needs  . Financial resource strain: Not on file  . Food insecurity:    Worry: Not on file    Inability: Not on file  . Transportation needs:    Medical: Not on file    Non-medical: Not on file  Tobacco Use  . Smoking status: Never Smoker  . Smokeless tobacco: Never Used  Substance and Sexual Activity  . Alcohol use: No  . Drug use: No    Comment: Caffiene: None.  . Sexual activity: Not Currently    Partners: Male  Lifestyle  . Physical activity:    Days per week: Not on file    Minutes per session: Not on file  . Stress: Not on file  Relationships  . Social connections:    Talks on phone: Not on file    Gets  together: Not on file    Attends religious service: Not on file    Active member of club or organization: Not on file    Attends meetings of clubs or organizations: Not on file    Relationship status: Not on file  Other Topics Concern  . Not on file  Social History Narrative  . Not on file   Allergies  Allergen Reactions  . Relafen [Nabumetone] Hives, Itching and Swelling  . Sulfa Antibiotics   . Clindamycin/Lincomycin Rash    Oral ONLY   Family History  Problem Relation Age of Onset  . Alcohol abuse Father   . Alcohol abuse Brother   . Coronary artery disease Maternal Grandmother   . Transient ischemic attack Maternal Grandmother   . Coronary artery disease Maternal Grandfather   . Heart attack Maternal Grandfather   . Transient ischemic attack Paternal Grandfather     Current Outpatient Medications (Endocrine & Metabolic):  .  levothyroxine (SYNTHROID) 25 MCG tablet, Take 25 mcg by mouth daily before breakfast. .  thyroid (ARMOUR) 60 MG tablet, Take 60 mg by mouth daily before breakfast.   Current Outpatient Medications (Respiratory):  .  albuterol (PROVENTIL HFA;VENTOLIN HFA)  108 (90 BASE) MCG/ACT inhaler, Inhale 2 puffs into the lungs every 6 (six) hours as needed. prn  .  levocetirizine (XYZAL) 5 MG tablet, Take 5 mg by mouth every evening. .  mometasone (ASMANEX) 220 MCG/INH inhaler, Inhale 1 puff into the lungs daily. Prescribed by Dr. Tempie Donning. .  montelukast (SINGULAIR) 10 MG tablet, Take 10 mg by mouth Daily. .  promethazine (PHENERGAN) 25 MG tablet, Take 1 tablet (25 mg total) by mouth every 6 (six) hours as needed for nausea or vomiting. .  SYMBICORT 80-4.5 MCG/ACT inhaler, Inhale 2 puffs into the lungs 2 (two) times daily.  Current Outpatient Medications (Analgesics):  .  zolmitriptan (ZOMIG) 5 MG tablet, Take 5 mg by mouth as needed. prn   Current Outpatient Medications (Hematological):  Marland Kitchen  Cyanocobalamin 5000 MCG SUBL, Place under the tongue.  Current  Outpatient Medications (Other):  Marland Kitchen  5-Hydroxytryptophan (5-HTP) 100 MG CAPS, Take 1 capsule by mouth 2 (two) times daily.  .  Ascorbic Acid (VITAMIN C) 1000 MG tablet, Take 1,000 mg by mouth daily. .  Biotin 800 MCG TABS, Take 1 tablet by mouth daily. Marland Kitchen  buPROPion (WELLBUTRIN XL) 150 MG 24 hr tablet, Take 1 tablet (150 mg total) by mouth daily. .  Coenzyme Q10 (CO Q-10) 200 MG CAPS, Take by mouth daily.  .  diazepam (VALIUM) 5 MG tablet, Take 1 tablet (5 mg total) by mouth every 12 (twelve) hours as needed. One tab by mouth, 2 hours before procedure. .  Diclofenac Sodium (PENNSAID) 2 % SOLN, Place 2 g onto the skin 2 (two) times daily. Marland Kitchen  EVENING PRIMROSE OIL PO, Take 1.3 g by mouth daily. .  hydrOXYzine (ATARAX/VISTARIL) 25 MG tablet, Take 1 tablet (25 mg total) by mouth 3 (three) times daily as needed. Marland Kitchen  L-Methylfolate (DEPLIN PO), Take 400 mcg by mouth 2 (two) times daily. Marland Kitchen  MAGNESIUM CITRATE PO*, Take by mouth. .  metaxalone (SKELAXIN) 800 MG tablet, Take 800 mg by mouth as needed. .  metoCLOPramide (REGLAN) 10 MG tablet, Take 10 mg by mouth 4 (four) times daily. .  Multiple Vitamins-Minerals (ZINC PO), Take 1 capsule by mouth 2 (two) times daily. .  Omega-3 Fatty Acids (FISH OIL) 300 MG CAPS, Take 900 mg by mouth daily.  Marland Kitchen  topiramate (TOPAMAX) 25 MG tablet, Take 75 mg by mouth at bedtime. .  vitamin A 81103 UNIT capsule, Take 25,000 Units by mouth 3 (three) times a week.  .  Vitamin D, Ergocalciferol, (DRISDOL) 1.25 MG (50000 UT) CAPS capsule, Take 1 capsule (50,000 Units total) by mouth every 7 (seven) days. .  vitamin E 400 UNIT capsule, Take 400 Units by mouth every other day. * These medications belong to multiple therapeutic classes and are listed under each applicable group.    Past medical history, social, surgical and family history all reviewed in electronic medical record.  No pertanent information unless stated regarding to the chief complaint.   Review of Systems:  No ,  visual changes, nausea, vomiting, diarrhea, constipation, dizziness, abdominal pain, skin rash, fevers, chills, night sweats, weight loss, swollen lymph nodes, body aches, chest pain, shortness of breath, mood changes.  Positive muscle aches, headaches  Objective  Blood pressure 102/84, pulse 82, height 5\' 4"  (1.626 m), weight 186 lb (84.4 kg), SpO2 98 %.   General: No apparent distress alert and oriented x3 mood and affect normal, dressed appropriately.  HEENT: Pupils equal, extraocular movements intact  Respiratory: Patient's speak in full sentences and  does not appear short of breath  Cardiovascular: No lower extremity edema, non tender, no erythema  Skin: Warm dry intact with no signs of infection or rash on extremities or on axial skeleton.  Abdomen: Soft nontender  Neuro: Cranial nerves II through XII are intact, neurovascularly intact in all extremities with 2+ DTRs and 2+ pulses.  Lymph: No lymphadenopathy of posterior or anterior cervical chain or axillae bilaterally.  Gait mild antalgic MSK:  Non tender with full range of motion and good stability and symmetric strength and tone of shoulders, elbows, wrist, hip, knee and bilaterally.  Left ankle exam shows that patient does have a bony abnormality noted of the medial malleolus.  No significant change from previous exam.  Mild limited range of motion in all planes of the left ankle.  Tender to palpation over the medial malleolus as well as the tarsal tunnel somewhat.  Patient does have significant pes planus with mild overpronation of the hindfoot.  Right ankle fairly unremarkable  Neck exam shows loss of lordosis.  Patient does have increased kyphosis of the upper back.  Patient does have some scapular dyskinesis bilaterally.  Osteopathic findings C2 flexed rotated and side bent right C4 flexed rotated and side bent left T3 extended rotated and side bent right inhaled third rib T11 extended rotated and side bent left L2 flexed  rotated and side bent right Sacrum r left on left     Impression and Recommendations:     This case required medical decision making of moderate complexity. The above documentation has been reviewed and is accurate and complete Julia SaaZachary M Smith, DO       Note: This dictation was prepared with Dragon dictation along with smaller phrase technology. Any transcriptional errors that result from this process are unintentional.

## 2018-03-15 ENCOUNTER — Ambulatory Visit (INDEPENDENT_AMBULATORY_CARE_PROVIDER_SITE_OTHER): Payer: BLUE CROSS/BLUE SHIELD | Admitting: Family Medicine

## 2018-03-15 ENCOUNTER — Encounter: Payer: Self-pay | Admitting: Family Medicine

## 2018-03-15 VITALS — BP 102/84 | HR 82 | Ht 64.0 in | Wt 186.0 lb

## 2018-03-15 DIAGNOSIS — M25571 Pain in right ankle and joints of right foot: Secondary | ICD-10-CM | POA: Diagnosis not present

## 2018-03-15 DIAGNOSIS — M999 Biomechanical lesion, unspecified: Secondary | ICD-10-CM

## 2018-03-15 NOTE — Patient Instructions (Signed)
Good to see you  Ice is your friend Stay active If ankle worsens will ne MRI  We will get back to you on the insurance I need to make a call  See me again in 4 weeks

## 2018-03-15 NOTE — Assessment & Plan Note (Signed)
Patient continues to have difficulty I do want him to have his Invanz imaging such as an MRI.  Patient declined.

## 2018-03-15 NOTE — Assessment & Plan Note (Signed)
Decision today to treat with OMT was based on Physical Exam  After verbal consent patient was treated with HVLA, ME, FPR techniques in cervical, thoracic, rib lumbar and sacral areas  Patient tolerated the procedure well with improvement in symptoms  Patient given exercises, stretches and lifestyle modifications  See medications in patient instructions if given  Patient will follow up in 4-8 weeks 

## 2018-03-26 ENCOUNTER — Encounter: Payer: Self-pay | Admitting: Family Medicine

## 2018-04-17 ENCOUNTER — Ambulatory Visit: Payer: Self-pay | Admitting: Family Medicine

## 2018-04-19 ENCOUNTER — Ambulatory Visit (INDEPENDENT_AMBULATORY_CARE_PROVIDER_SITE_OTHER): Payer: BLUE CROSS/BLUE SHIELD | Admitting: Family Medicine

## 2018-04-19 ENCOUNTER — Encounter: Payer: Self-pay | Admitting: Family Medicine

## 2018-04-19 VITALS — BP 126/90 | HR 75 | Ht 64.0 in | Wt 189.0 lb

## 2018-04-19 DIAGNOSIS — M545 Low back pain: Secondary | ICD-10-CM | POA: Diagnosis not present

## 2018-04-19 DIAGNOSIS — G8929 Other chronic pain: Secondary | ICD-10-CM | POA: Diagnosis not present

## 2018-04-19 DIAGNOSIS — M999 Biomechanical lesion, unspecified: Secondary | ICD-10-CM | POA: Diagnosis not present

## 2018-04-19 NOTE — Progress Notes (Signed)
Julia [name] D.O.  [facility] [address] [place], [place] [address] Phone: [phone] Subjective:    I Julia [name] am serving as a Neurosurgeonscribe for Dr. Antoine PrimasZachary Julia [name].   CC: Back pain follow-up  WNU:[id number]:Subjective  Julia FloodJennifer [name] is a 53 y.o. female coming in with complaint of back pain. States that she is doing well. Pain is minimal. Patient states that overall seems to be doing the same.  Still having discomfort from time to time.  Feels on her feet pain is doing okay.  Patient states that symptoms are more sore than the back at the moment.  Has been wearing [employer] shoes at work.     Past Medical History:  Diagnosis Date  . Asthma with allergic rhinitis   . Elevated liver enzymes   . Liver disease    Fatty liver  . Migraines   . Polycystic ovaries 1984  . Sleep apnea syndrome   . Subclinical hypothyroidism    Past Surgical History:  Procedure Laterality Date  . Laproscopy  2011  . LASER ABLATION  2012  . LITHOTRIPSY  2000   Social History   Socioeconomic History  . Marital status: Divorced    Spouse name: Not on file  . Number of children: Not on file  . Years of education: Not on file  . Highest education level: Not on file  Occupational History  . Not on file  Social Needs  . Financial resource strain: Not on file  . Food insecurity:    Worry: Not on file    Inability: Not on file  . Transportation needs:    Medical: Not on file    Non-medical: Not on file  Tobacco Use  . Smoking status: Never Smoker  . Smokeless tobacco: Never Used  Substance and Sexual Activity  . Alcohol use: No  . Drug use: No    Comment: Caffiene: None.  . Sexual activity: Not Currently    Partners: Male  Lifestyle  . Physical activity:    Days per week: Not on file    Minutes per session: Not on file  . Stress: Not on file  Relationships  . Social connections:    Talks on phone: Not on file    Gets together: Not on file    Attends religious service: Not on file      Active member of club or organization: Not on file    Attends meetings of clubs or organizations: Not on file    Relationship status: Not on file  Other Topics Concern  . Not on file  Social History Narrative  . Not on file   Allergies  Allergen Reactions  . Relafen [Nabumetone] Hives, Itching and Swelling  . Sulfa Antibiotics   . Clindamycin/Lincomycin Rash    Oral ONLY   Family History  Problem Relation Age of Onset  . Alcohol abuse Father   . Alcohol abuse Brother   . Coronary artery disease Maternal Grandmother   . Transient ischemic attack Maternal Grandmother   . Coronary artery disease Maternal Grandfather   . Heart attack Maternal Grandfather   . Transient ischemic attack Paternal Grandfather     Current Outpatient Medications (Endocrine & Metabolic):  .  levothyroxine (SYNTHROID) 25 MCG tablet, Take 25 mcg by mouth daily before breakfast. .  thyroid (ARMOUR) 60 MG tablet, Take 60 mg by mouth daily before breakfast.   Current Outpatient Medications (Respiratory):  .  albuterol (PROVENTIL HFA;VENTOLIN HFA) 108 (90 BASE) MCG/ACT inhaler, Inhale 2  puffs into the lungs every 6 (six) hours as needed. prn  .  levocetirizine (XYZAL) 5 MG tablet, Take 5 mg by mouth every evening. .  mometasone (ASMANEX) 220 MCG/INH inhaler, Inhale 1 puff into the lungs daily. Prescribed by Dr. Tempie Donning. .  montelukast (SINGULAIR) 10 MG tablet, Take 10 mg by mouth Daily. .  promethazine (PHENERGAN) 25 MG tablet, Take 1 tablet (25 mg total) by mouth every 6 (six) hours as needed for nausea or vomiting. .  SYMBICORT 80-4.5 MCG/ACT inhaler, Inhale 2 puffs into the lungs 2 (two) times daily.  Current Outpatient Medications (Analgesics):  .  zolmitriptan (ZOMIG) 5 MG tablet, Take 5 mg by mouth as needed. prn   Current Outpatient Medications (Hematological):  Marland Kitchen  Cyanocobalamin 5000 MCG SUBL, Place under the tongue.  Current Outpatient Medications (Other):  Marland Kitchen  5-Hydroxytryptophan (5-HTP) 100  MG CAPS, Take 1 capsule by mouth 2 (two) times daily.  .  Ascorbic Acid (VITAMIN C) 1000 MG tablet, Take 1,000 mg by mouth daily. .  Biotin 800 MCG TABS, Take 1 tablet by mouth daily. Marland Kitchen  buPROPion (WELLBUTRIN XL) 150 MG 24 hr tablet, Take 1 tablet (150 mg total) by mouth daily. .  Coenzyme Q10 (CO Q-10) 200 MG CAPS, Take by mouth daily.  .  diazepam (VALIUM) 5 MG tablet, Take 1 tablet (5 mg total) by mouth every 12 (twelve) hours as needed. One tab by mouth, 2 hours before procedure. .  Diclofenac Sodium (PENNSAID) 2 % SOLN, Place 2 g onto the skin 2 (two) times daily. Marland Kitchen  EVENING PRIMROSE OIL PO, Take 1.3 g by mouth daily. .  hydrOXYzine (ATARAX/VISTARIL) 25 MG tablet, Take 1 tablet (25 mg total) by mouth 3 (three) times daily as needed. Marland Kitchen  L-Methylfolate (DEPLIN PO), Take 400 mcg by mouth 2 (two) times daily. Marland Kitchen  MAGNESIUM CITRATE PO*, Take by mouth. .  metaxalone (SKELAXIN) 800 MG tablet, Take 800 mg by mouth as needed. .  metoCLOPramide (REGLAN) 10 MG tablet, Take 10 mg by mouth 4 (four) times daily. .  Multiple Vitamins-Minerals (ZINC PO), Take 1 capsule by mouth 2 (two) times daily. .  Omega-3 Fatty Acids (FISH OIL) 300 MG CAPS, Take 900 mg by mouth daily.  Marland Kitchen  topiramate (TOPAMAX) 25 MG tablet, Take 75 mg by mouth at bedtime. .  vitamin A 81017 UNIT capsule, Take 25,000 Units by mouth 3 (three) times a week.  .  Vitamin D, Ergocalciferol, (DRISDOL) 1.25 MG (50000 UT) CAPS capsule, Take 1 capsule (50,000 Units total) by mouth every 7 (seven) days. .  vitamin E 400 UNIT capsule, Take 400 Units by mouth every other day. * These medications belong to multiple therapeutic classes and are listed under each applicable group.    Past medical history, social, surgical and family history all reviewed in electronic medical record.  No pertanent information unless stated regarding to the chief complaint.   Review of Systems:  No headache, visual changes, nausea, vomiting, diarrhea, constipation,  dizziness, abdominal pain, skin rash, fevers, chills, night sweats, weight loss, swollen lymph nodes, body aches, joint swelling, , chest pain, shortness of breath, mood changes.  Positive muscle aches  Objective  Blood pressure 126/90, pulse 75, height 5\' 4"  (1.626 m), weight 189 lb (85.7 kg), SpO2 98 %.    General: No apparent distress alert and oriented x3 mood and affect normal, dressed appropriately.  HEENT: Pupils equal, extraocular movements intact  Respiratory: Patient's speak in full sentences and does not appear short  of breath  Cardiovascular: No lower extremity edema, non tender, no erythema  Skin: Warm dry intact with no signs of infection or rash on extremities or on axial skeleton.  Abdomen: Soft nontender  Neuro: Cranial nerves II through XII are intact, neurovascularly intact in all extremities with 2+ DTRs and 2+ pulses.  Lymph: No lymphadenopathy of posterior or anterior cervical chain or axillae bilaterally.  Gait normal with good balance and coordination.  MSK:  Non tender with full range of motion and good stability and symmetric strength and tone of shoulders, elbows, wrist, hip, knee and ankles bilaterally.  Back exam does have some loss of lordosis.  Tightness of the [facility] bilaterally.  Mild pain over the sacroiliac joint bilaterally.  Tightness of the hamstrings noted.  Negative straight leg test.  Severe pes planus bilaterally with overpronation breakdown of the transverse arch  Osteopathic findings C6 flexed rotated and side bent left T3 extended rotated and side bent right inhaled third rib T6 extended rotated and side bent left L2 flexed rotated and side bent right Sacrum right on right     Impression and Recommendations:     This case required medical decision making of moderate complexity. The above documentation has been reviewed and is accurate and complete Julia SaaZachary M Leondro Coryell, DO       Note: This dictation was prepared with Dragon dictation along  with smaller phrase technology. Any transcriptional errors that result from this process are unintentional.

## 2018-04-19 NOTE — Assessment & Plan Note (Signed)
Decision today to treat with OMT was based on Physical Exam  After verbal consent patient was treated with HVLA, ME, FPR techniques in cervical, thoracic, rib lumbar and sacral areas  Patient tolerated the procedure well with improvement in symptoms  Patient given exercises, stretches and lifestyle modifications  See medications in patient instructions if given  Patient will follow up in 4 weeks 

## 2018-04-19 NOTE — Patient Instructions (Signed)
Good to see you  Ice is your friend Stay active Lets watch the foot  The [employer] shoes may be interesting to try for you  We can consider other custom orthotics in you as well  See me again in 4 weeks and we will also consider injection if needed

## 2018-04-19 NOTE — Assessment & Plan Note (Signed)
Patient does have low back pain.  Seems to be multifactorial.  I do believe the patient's plantar fasciitis and pes planus could be contributing.  We discussed the possibility of custom orthotics by 1 of our other providers which patient declined at the moment.  Started osteopathic manipulation.  Discussed which activities to do which was to avoid.  Patient will follow-up with me again in 4 weeks.

## 2018-05-15 ENCOUNTER — Other Ambulatory Visit: Payer: Self-pay | Admitting: Family Medicine

## 2018-05-17 ENCOUNTER — Ambulatory Visit (INDEPENDENT_AMBULATORY_CARE_PROVIDER_SITE_OTHER): Payer: BLUE CROSS/BLUE SHIELD | Admitting: Family Medicine

## 2018-05-17 ENCOUNTER — Encounter: Payer: Self-pay | Admitting: Family Medicine

## 2018-05-17 VITALS — BP 138/90 | HR 74

## 2018-05-17 DIAGNOSIS — M999 Biomechanical lesion, unspecified: Secondary | ICD-10-CM | POA: Diagnosis not present

## 2018-05-17 DIAGNOSIS — M542 Cervicalgia: Secondary | ICD-10-CM

## 2018-05-17 NOTE — Assessment & Plan Note (Signed)
Decision today to treat with OMT was based on Physical Exam  After verbal consent patient was treated with HVLA, ME, FPR techniques in cervical, thoracic, rib,  lumbar and sacral areas  Patient tolerated the procedure well with improvement in symptoms  Patient given exercises, stretches and lifestyle modifications  See medications in patient instructions if given  Patient will follow up in 4-8 weeks 

## 2018-05-17 NOTE — Assessment & Plan Note (Signed)
Neck pain, tightness, does respond well to manipulation, we discussed again posture and ergonomics, no significant changes in medications.  Patient will follow-up again in 4 to 6 weeks

## 2018-05-17 NOTE — Progress Notes (Signed)
[name] [address] [place], [place] [address] Phone: [phone] Subjective:   I [name] am serving as a Neurosurgeon for Dr. Antoine Primas.   CC: Back pain neck pain follow-up  CLE:[id number]    Julia [name] is a 53 y.o. female coming in with complaint of ankle. Back and neck pain. Ankle is painful due to completing lawn work.  Patient has seen me multiple times for manipulation.  More tightness  Patient denies any radiation to any of the extremities.  Patient has had more stress, when she was also recently mowing the lawn had more discomfort.     Past Medical History:  Diagnosis Date  . Asthma with allergic rhinitis   . Elevated liver enzymes   . Liver disease    Fatty liver  . Migraines   . Polycystic ovaries 1984  . Sleep apnea syndrome   . Subclinical hypothyroidism    Past Surgical History:  Procedure Laterality Date  . Laproscopy  2011  . LASER ABLATION  2012  . LITHOTRIPSY  2000   Social History   Socioeconomic History  . Marital status: Divorced    Spouse name: Not on file  . Number of children: Not on file  . Years of education: Not on file  . Highest education level: Not on file  Occupational History  . Not on file  Social Needs  . Financial resource strain: Not on file  . Food insecurity:    Worry: Not on file    Inability: Not on file  . Transportation needs:    Medical: Not on file    Non-medical: Not on file  Tobacco Use  . Smoking status: Never Smoker  . Smokeless tobacco: Never Used  Substance and Sexual Activity  . Alcohol use: No  . Drug use: No    Comment: Caffiene: None.  . Sexual activity: Not Currently    Partners: Male  Lifestyle  . Physical activity:    Days per week: Not on file    Minutes per session: Not on file  . Stress: Not on file  Relationships  . Social connections:    Talks on phone: Not on file    Gets together: Not on file    Attends religious service: Not on file   Active member of club or organization: Not on file    Attends meetings of clubs or organizations: Not on file    Relationship status: Not on file  Other Topics Concern  . Not on file  Social History Narrative  . Not on file   Allergies  Allergen Reactions  . Relafen [Nabumetone] Hives, Itching and Swelling  . Sulfa Antibiotics   . Clindamycin/Lincomycin Rash    Oral ONLY   Family History  Problem Relation Age of Onset  . Alcohol abuse Father   . Alcohol abuse Brother   . Coronary artery disease Maternal Grandmother   . Transient ischemic attack Maternal Grandmother   . Coronary artery disease Maternal Grandfather   . Heart attack Maternal Grandfather   . Transient ischemic attack Paternal Grandfather     Current Outpatient Medications (Endocrine & Metabolic):  .  levothyroxine (SYNTHROID) 25 MCG tablet, Take 25 mcg by mouth daily before breakfast. .  thyroid (ARMOUR) 60 MG tablet, Take 60 mg by mouth daily before breakfast.   Current Outpatient Medications (Respiratory):  .  albuterol (PROVENTIL HFA;VENTOLIN HFA) 108 (90 BASE) MCG/ACT inhaler, Inhale 2 puffs into the lungs every 6 (six) hours  as needed. prn  .  levocetirizine (XYZAL) 5 MG tablet, Take 5 mg by mouth every evening. .  mometasone (ASMANEX) 220 MCG/INH inhaler, Inhale 1 puff into the lungs daily. Prescribed by Dr. Tempie Donning. .  montelukast (SINGULAIR) 10 MG tablet, Take 10 mg by mouth Daily. .  promethazine (PHENERGAN) 25 MG tablet, Take 1 tablet (25 mg total) by mouth every 6 (six) hours as needed for nausea or vomiting. .  SYMBICORT 80-4.5 MCG/ACT inhaler, Inhale 2 puffs into the lungs 2 (two) times daily.  Current Outpatient Medications (Analgesics):  .  zolmitriptan (ZOMIG) 5 MG tablet, Take 5 mg by mouth as needed. prn   Current Outpatient Medications (Hematological):  Marland Kitchen  Cyanocobalamin 5000 MCG SUBL, Place under the tongue.  Current Outpatient Medications (Other):  Marland Kitchen  5-Hydroxytryptophan (5-HTP) 100 MG  CAPS, Take 1 capsule by mouth 2 (two) times daily.  .  Ascorbic Acid (VITAMIN C) 1000 MG tablet, Take 1,000 mg by mouth daily. .  Biotin 800 MCG TABS, Take 1 tablet by mouth daily. Marland Kitchen  buPROPion (WELLBUTRIN XL) 150 MG 24 hr tablet, Take 1 tablet (150 mg total) by mouth daily. .  Coenzyme Q10 (CO Q-10) 200 MG CAPS, Take by mouth daily.  .  diazepam (VALIUM) 5 MG tablet, Take 1 tablet (5 mg total) by mouth every 12 (twelve) hours as needed. One tab by mouth, 2 hours before procedure. .  Diclofenac Sodium (PENNSAID) 2 % SOLN, Place 2 g onto the skin 2 (two) times daily. Marland Kitchen  EVENING PRIMROSE OIL PO, Take 1.3 g by mouth daily. .  hydrOXYzine (ATARAX/VISTARIL) 25 MG tablet, Take 1 tablet (25 mg total) by mouth 3 (three) times daily as needed. Marland Kitchen  L-Methylfolate (DEPLIN PO), Take 400 mcg by mouth 2 (two) times daily. Marland Kitchen  MAGNESIUM CITRATE PO*, Take by mouth. .  metaxalone (SKELAXIN) 800 MG tablet, Take 800 mg by mouth as needed. .  metoCLOPramide (REGLAN) 10 MG tablet, Take 10 mg by mouth 4 (four) times daily. .  Multiple Vitamins-Minerals (ZINC PO), Take 1 capsule by mouth 2 (two) times daily. .  Omega-3 Fatty Acids (FISH OIL) 300 MG CAPS, Take 900 mg by mouth daily.  Marland Kitchen  topiramate (TOPAMAX) 25 MG tablet, Take 75 mg by mouth at bedtime. .  vitamin A 59563 UNIT capsule, Take 25,000 Units by mouth 3 (three) times a week.  .  Vitamin D, Ergocalciferol, (DRISDOL) 1.25 MG (50000 UT) CAPS capsule, TAKE 1 CAP (50,000) UNITS BY MOUTH EVERY 7 DAYS .  vitamin E 400 UNIT capsule, Take 400 Units by mouth every other day. * These medications belong to multiple therapeutic classes and are listed under each applicable group.    Past medical history, social, surgical and family history all reviewed in electronic medical record.  No pertanent information unless stated regarding to the chief complaint.   Review of Systems:  No headache, visual changes, nausea, vomiting, diarrhea, constipation, dizziness, abdominal  pain, skin rash, fevers, chills, night sweats, weight loss, swollen lymph nodes, body aches, joint swelling, muscle aches, chest pain, shortness of breath, mood changes.   Objective  Blood pressure 138/90, pulse 74, SpO2 97 %.   General: No apparent distress alert and oriented x3 mood and affect normal, dressed appropriately.  HEENT: Pupils equal, extraocular movements intact  Respiratory: Patient's speak in full sentences and does not appear short of breath  Cardiovascular: No lower extremity edema, non tender, no erythema  Skin: Warm dry intact with no signs of infection or  rash on extremities or on axial skeleton.  Abdomen: Soft nontender  Neuro: Cranial nerves II through XII are intact, neurovascularly intact in all extremities with 2+ DTRs and 2+ pulses.  Lymph: No lymphadenopathy of posterior or anterior cervical chain or axillae bilaterally.  Gait normal with good balance and coordination.  MSK:  tender with limited range of motion and good stability and symmetric strength and tone of shoulders, elbows, wrist, hip, knee and ankles bilaterally.  Neck: Inspection of lordosis. No palpable stepoffs. Negative Spurling's maneuver.  Range of motion of sidebending and rotation bilaterally Grip strength and sensation normal in bilateral hands Strength good C4 to T1 distribution No sensory change to C4 to T1 Negative Hoffman sign bilaterally Reflexes normal Tightness of the right trapezius  Osteopathic findings C2 flexed rotated and side bent right C6 flexed rotated and side bent left T3 extended rotated and side bent right inhaled third rib T5 extended rotated and side bent left L3 flexed rotated and side bent right Sacrum right on right     Impression and Recommendations:     This case required medical decision making of moderate complexity. The above documentation has been reviewed and is accurate and complete Judi Saa, DO       Note: This dictation was prepared  with Dragon dictation along with smaller phrase technology. Any transcriptional errors that result from this process are unintentional.

## 2018-08-16 ENCOUNTER — Encounter: Payer: Self-pay | Admitting: Family Medicine

## 2018-08-21 ENCOUNTER — Other Ambulatory Visit: Payer: Self-pay

## 2018-08-21 ENCOUNTER — Other Ambulatory Visit (INDEPENDENT_AMBULATORY_CARE_PROVIDER_SITE_OTHER): Payer: BC Managed Care – PPO

## 2018-08-21 ENCOUNTER — Ambulatory Visit: Payer: Self-pay

## 2018-08-21 ENCOUNTER — Encounter: Payer: Self-pay | Admitting: Family Medicine

## 2018-08-21 ENCOUNTER — Ambulatory Visit (INDEPENDENT_AMBULATORY_CARE_PROVIDER_SITE_OTHER): Payer: BC Managed Care – PPO | Admitting: Family Medicine

## 2018-08-21 VITALS — BP 130/82 | HR 78 | Ht 64.0 in | Wt 191.0 lb

## 2018-08-21 DIAGNOSIS — M2550 Pain in unspecified joint: Secondary | ICD-10-CM

## 2018-08-21 DIAGNOSIS — M25572 Pain in left ankle and joints of left foot: Secondary | ICD-10-CM | POA: Diagnosis not present

## 2018-08-21 DIAGNOSIS — M542 Cervicalgia: Secondary | ICD-10-CM | POA: Diagnosis not present

## 2018-08-21 DIAGNOSIS — G8929 Other chronic pain: Secondary | ICD-10-CM

## 2018-08-21 DIAGNOSIS — M25571 Pain in right ankle and joints of right foot: Secondary | ICD-10-CM | POA: Diagnosis not present

## 2018-08-21 DIAGNOSIS — M999 Biomechanical lesion, unspecified: Secondary | ICD-10-CM | POA: Diagnosis not present

## 2018-08-21 LAB — CBC WITH DIFFERENTIAL/PLATELET
Basophils Absolute: 0 10*3/uL (ref 0.0–0.1)
Basophils Relative: 0.9 % (ref 0.0–3.0)
Eosinophils Absolute: 0.2 10*3/uL (ref 0.0–0.7)
Eosinophils Relative: 3.1 % (ref 0.0–5.0)
HCT: 44.4 % (ref 36.0–46.0)
Hemoglobin: 15.1 g/dL — ABNORMAL HIGH (ref 12.0–15.0)
Lymphocytes Relative: 26.6 % (ref 12.0–46.0)
Lymphs Abs: 1.3 10*3/uL (ref 0.7–4.0)
MCHC: 33.9 g/dL (ref 30.0–36.0)
MCV: 90.7 fl (ref 78.0–100.0)
Monocytes Absolute: 0.5 10*3/uL (ref 0.1–1.0)
Monocytes Relative: 10 % (ref 3.0–12.0)
Neutro Abs: 2.9 10*3/uL (ref 1.4–7.7)
Neutrophils Relative %: 59.4 % (ref 43.0–77.0)
Platelets: 301 10*3/uL (ref 150.0–400.0)
RBC: 4.89 Mil/uL (ref 3.87–5.11)
RDW: 13.7 % (ref 11.5–15.5)
WBC: 4.8 10*3/uL (ref 4.0–10.5)

## 2018-08-21 LAB — SEDIMENTATION RATE: Sed Rate: 28 mm/hr (ref 0–30)

## 2018-08-21 LAB — URIC ACID: Uric Acid, Serum: 4.8 mg/dL (ref 2.4–7.0)

## 2018-08-21 LAB — COMPREHENSIVE METABOLIC PANEL
ALT: 56 U/L — ABNORMAL HIGH (ref 0–35)
AST: 32 U/L (ref 0–37)
Albumin: 4.2 g/dL (ref 3.5–5.2)
Alkaline Phosphatase: 89 U/L (ref 39–117)
BUN: 25 mg/dL — ABNORMAL HIGH (ref 6–23)
CO2: 23 mEq/L (ref 19–32)
Calcium: 9.6 mg/dL (ref 8.4–10.5)
Chloride: 108 mEq/L (ref 96–112)
Creatinine, Ser: 0.73 mg/dL (ref 0.40–1.20)
GFR: 83.48 mL/min (ref >60.00)
Glucose, Bld: 84 mg/dL (ref 70–99)
Potassium: 3.8 mEq/L (ref 3.5–5.1)
Sodium: 140 mEq/L (ref 135–145)
Total Bilirubin: 0.6 mg/dL (ref 0.2–1.2)
Total Protein: 7.4 g/dL (ref 6.0–8.3)

## 2018-08-21 LAB — FERRITIN: Ferritin: 166.4 ng/mL (ref 10.0–291.0)

## 2018-08-21 LAB — IBC PANEL
Iron: 94 ug/dL (ref 42–145)
Saturation Ratios: 28.8 % (ref 20.0–50.0)
Transferrin: 233 mg/dL (ref 212.0–360.0)

## 2018-08-21 LAB — C-REACTIVE PROTEIN: CRP: <1 mg/dL (ref 0.5–20.0)

## 2018-08-21 LAB — TSH: TSH: 0.59 u[IU]/mL (ref 0.35–4.50)

## 2018-08-21 LAB — TESTOSTERONE: Testosterone: 43.14 ng/dL — ABNORMAL HIGH (ref 15.00–40.00)

## 2018-08-21 NOTE — Assessment & Plan Note (Signed)
Posture and ergonomics.  Discussed working habits.  We discussed stress and trying to review this as well.  Encourage the exercises.  Follow-up again in 4 to 6 weeks

## 2018-08-21 NOTE — Patient Instructions (Addendum)
Labs downstairs today Wear Aircast daily for 2 weeks Ice 2x a day for 20 minutes ROM exercises 3x a week See me again in 2-3 weeks

## 2018-08-21 NOTE — Assessment & Plan Note (Signed)
Abnormality of the right ankle still noted.  Seems to be worsening.  We discussed the possibility of MRI which patient declined again today.  Patient has had this abnormality since in December but seem to be getting better initially.  Laboratory work-up also ordered today for further evaluation.

## 2018-08-21 NOTE — Progress Notes (Signed)
Julia [name]  D.O. [facility] [address] [place], [place] [address] Phone: [phone] Subjective:   Julia [name], Julia [name], am serving as a scribe for Dr. Antoine PrimasZachary .    CC: Ankle pain, neck and back pain follow-up  BJY:[id number]:Subjective  Julia FloodJennifer [name] is a 53 y.o. female coming in with complaint of right ankle pain. Pain is over medial malleolus. Had similar pain in December 2019. Has had pain for 1 week. Worse with weightbearing. Did wear a pair of shoes that are higher on the lateral side of shoe. Did try elevating her ankle one day.  Patient's exam does not seem to ever go away completely.  Patient is also having back pain. Here for OMT. Has been having a migraine for 2 days. Onset with changes in barometric pressure.  Patient has noted some mild increasing headaches recently as well.    Past Medical History:  Diagnosis Date  . Asthma with allergic rhinitis   . Elevated liver enzymes   . Liver disease    Fatty liver  . Migraines   . Polycystic ovaries 1984  . Sleep apnea syndrome   . Subclinical hypothyroidism    Past Surgical History:  Procedure Laterality Date  . Laproscopy  2011  . LASER ABLATION  2012  . LITHOTRIPSY  2000   Social History   Socioeconomic History  . Marital status: Divorced    Spouse name: Not on file  . Number of children: Not on file  . Years of education: Not on file  . Highest education level: Not on file  Occupational History  . Not on file  Social Needs  . Financial resource strain: Not on file  . Food insecurity    Worry: Not on file    Inability: Not on file  . Transportation needs    Medical: Not on file    Non-medical: Not on file  Tobacco Use  . Smoking status: Never Smoker  . Smokeless tobacco: Never Used  Substance and Sexual Activity  . Alcohol use: No  . Drug use: No    Comment: Caffiene: None.  . Sexual activity: Not Currently    Partners: Male  Lifestyle  . Physical activity    Days per week: Not on file   Minutes per session: Not on file  . Stress: Not on file  Relationships  . Social Musicianconnections    Talks on phone: Not on file    Gets together: Not on file    Attends religious service: Not on file    Active member of club or organization: Not on file    Attends meetings of clubs or organizations: Not on file    Relationship status: Not on file  Other Topics Concern  . Not on file  Social History Narrative  . Not on file   Allergies  Allergen Reactions  . Relafen [Nabumetone] Hives, Itching and Swelling  . Sulfa Antibiotics   . Clindamycin/Lincomycin Rash    Oral ONLY   Family History  Problem Relation Age of Onset  . Alcohol abuse Father   . Alcohol abuse Brother   . Coronary artery disease Maternal Grandmother   . Transient ischemic attack Maternal Grandmother   . Coronary artery disease Maternal Grandfather   . Heart attack Maternal Grandfather   . Transient ischemic attack Paternal Grandfather     Current Outpatient Medications (Endocrine & Metabolic):  .  levothyroxine (SYNTHROID) 25 MCG tablet, Take 25 mcg by mouth daily before breakfast. .  thyroid (ARMOUR)  60 MG tablet, Take 60 mg by mouth daily before breakfast.   Current Outpatient Medications (Respiratory):  .  albuterol (PROVENTIL HFA;VENTOLIN HFA) 108 (90 BASE) MCG/ACT inhaler, Inhale 2 puffs into the lungs every 6 (six) hours as needed. prn  .  levocetirizine (XYZAL) 5 MG tablet, Take 5 mg by mouth every evening. .  mometasone (ASMANEX) 220 MCG/INH inhaler, Inhale 1 puff into the lungs daily. Prescribed by Dr. Latricia Heft. .  montelukast (SINGULAIR) 10 MG tablet, Take 10 mg by mouth Daily. .  promethazine (PHENERGAN) 25 MG tablet, Take 1 tablet (25 mg total) by mouth every 6 (six) hours as needed for nausea or vomiting. .  SYMBICORT 80-4.5 MCG/ACT inhaler, Inhale 2 puffs into the lungs 2 (two) times daily.  Current Outpatient Medications (Analgesics):  .  zolmitriptan (ZOMIG) 5 MG tablet, Take 5 mg by mouth as  needed. prn   Current Outpatient Medications (Hematological):  Marland Kitchen  Cyanocobalamin 5000 MCG SUBL, Place under the tongue.  Current Outpatient Medications (Other):  Marland Kitchen  5-Hydroxytryptophan (5-HTP) 100 MG CAPS, Take 1 capsule by mouth 2 (two) times daily.  .  Ascorbic Acid (VITAMIN C) 1000 MG tablet, Take 1,000 mg by mouth daily. .  Biotin 800 MCG TABS, Take 1 tablet by mouth daily. Marland Kitchen  buPROPion (WELLBUTRIN XL) 150 MG 24 hr tablet, Take 1 tablet (150 mg total) by mouth daily. .  Coenzyme Q10 (CO Q-10) 200 MG CAPS, Take by mouth daily.  .  diazepam (VALIUM) 5 MG tablet, Take 1 tablet (5 mg total) by mouth every 12 (twelve) hours as needed. One tab by mouth, 2 hours before procedure. .  Diclofenac Sodium (PENNSAID) 2 % SOLN, Place 2 g onto the skin 2 (two) times daily. Marland Kitchen  EVENING PRIMROSE OIL PO, Take 1.3 g by mouth daily. .  hydrOXYzine (ATARAX/VISTARIL) 25 MG tablet, Take 1 tablet (25 mg total) by mouth 3 (three) times daily as needed. Marland Kitchen  L-Methylfolate (DEPLIN PO), Take 400 mcg by mouth 2 (two) times daily. Marland Kitchen  MAGNESIUM CITRATE PO*, Take by mouth. .  metaxalone (SKELAXIN) 800 MG tablet, Take 800 mg by mouth as needed. .  metoCLOPramide (REGLAN) 10 MG tablet, Take 10 mg by mouth 4 (four) times daily. .  Multiple Vitamins-Minerals (ZINC PO), Take 1 capsule by mouth 2 (two) times daily. .  Omega-3 Fatty Acids (FISH OIL) 300 MG CAPS, Take 900 mg by mouth daily.  Marland Kitchen  topiramate (TOPAMAX) 25 MG tablet, Take 75 mg by mouth at bedtime. .  vitamin A 25000 UNIT capsule, Take 25,000 Units by mouth 3 (three) times a week.  .  Vitamin D, Ergocalciferol, (DRISDOL) 1.25 MG (50000 UT) CAPS capsule, TAKE 1 CAP (50,000) UNITS BY MOUTH EVERY 7 DAYS .  vitamin E 400 UNIT capsule, Take 400 Units by mouth every other day. * These medications belong to multiple therapeutic classes and are listed under each applicable group.    Past medical history, social, surgical and family history all reviewed in electronic  medical record.  No pertanent information unless stated regarding to the chief complaint.   Review of Systems:  No visual changes, nausea, vomiting, diarrhea, constipation, dizziness, abdominal pain, skin rash, fevers, chills, night sweats, weight loss, swollen lymph nodes, body aches, joint swelling, [name] chest pain, shortness of breath, mood changes.  Positive muscle aches, headaches  Objective  Blood pressure 130/82, pulse 78, height 5\' 4"  (1.626 [name]), weight 191 lb (86.6 kg), SpO2 98 %.    General: No apparent distress  alert and oriented x3 mood and affect normal, dressed appropriately.  HEENT: Pupils equal, extraocular movements intact  Respiratory: Patient's speak in full sentences and does not appear short of breath  Cardiovascular: No lower extremity edema, non tender, no erythema  Skin: Warm dry intact with no signs of infection or rash on extremities or on axial skeleton.  Abdomen: Soft nontender  Neuro: Cranial nerves II through XII are intact, neurovascularly intact in all extremities with 2+ DTRs and 2+ pulses.  Lymph: No lymphadenopathy of posterior or anterior cervical chain or axillae bilaterally.  Gait normal with good balance and coordination.  MSK:  Non tender with full range of motion and good stability and symmetric strength and tone of shoulders, elbows, wrist, hip, knee bilaterally.  Right ankle does have some atypical findings.  Patient does have atypical pain mostly in the medial malleolus.  Seems to be larger than previous exam.  Tender to palpation in this area as well as over the medial ankle.  Mild pain over the talar dome.  Trace effusion of the ankle joint contralateral ankle unremarkable  Limited musculoskeletal ultrasound was performed and interpreted by Julia [name]   Limited ultrasound shows the patient does have some soft tissue swelling on the anterior aspect of the medial malleolus.  No significant atypical neovascularization noted at this time.  Patient does  have narrowing of the medial joint line.  Irregularities of the anterior medial talar dome also noted.  Neck: Inspection mild loss of lordosis. No palpable stepoffs. Negative Spurling's maneuver. Limited range of motion especially with extension of 10 degrees Grip strength and sensation normal in bilateral hands Strength good C4 to T1 distribution No sensory change to C4 to T1 Negative Hoffman sign bilaterally Reflexes normal  Osteopathic findings C2 flexed rotated and side bent right C7 flexed rotated and side bent left T3 extended rotated and side bent right inhaled third rib T9 extended rotated and side bent left L2 flexed rotated and side bent right Sacrum right on right    Impression and Recommendations:     This case required medical decision making of moderate complexity. The above documentation has been reviewed and is accurate and complete Julia [name] , DO       Note: This dictation was prepared with Dragon dictation along with smaller phrase technology. Any transcriptional errors that result from this process are unintentional.

## 2018-08-21 NOTE — Assessment & Plan Note (Signed)
Decision today to treat with OMT was based on Physical Exam  After verbal consent patient was treated with HVLA, ME, FPR techniques in cervical, thoracic, rib lumbar and sacral areas  Patient tolerated the procedure well with improvement in symptoms  Patient given exercises, stretches and lifestyle modifications  See medications in patient instructions if given  Patient will follow up in 4-6 weeks 

## 2018-08-22 LAB — VITAMIN D 25 HYDROXY (VIT D DEFICIENCY, FRACTURES): VITD: 61.97 ng/mL (ref 30.00–100.00)

## 2018-08-23 LAB — ANA: Anti Nuclear Antibody (ANA): NEGATIVE

## 2018-08-23 LAB — CALCIUM, IONIZED: Calcium, Ion: 5.44 mg/dL (ref 4.8–5.6)

## 2018-08-23 LAB — PTH, INTACT AND CALCIUM
Calcium: 9.8 mg/dL (ref 8.6–10.4)
PTH: 19 pg/mL (ref 14–64)

## 2018-08-23 LAB — RHEUMATOID FACTOR: Rhuematoid fact SerPl-aCnc: <14 IU/mL (ref <14)

## 2018-08-23 LAB — ANGIOTENSIN CONVERTING ENZYME: Angiotensin-Converting Enzyme: 31 U/L (ref 9–67)

## 2018-09-04 ENCOUNTER — Ambulatory Visit: Payer: Self-pay

## 2018-09-04 ENCOUNTER — Ambulatory Visit: Payer: BC Managed Care – PPO | Admitting: Family Medicine

## 2018-09-04 ENCOUNTER — Other Ambulatory Visit: Payer: Self-pay

## 2018-09-04 ENCOUNTER — Encounter: Payer: Self-pay | Admitting: Family Medicine

## 2018-09-04 VITALS — BP 120/76 | HR 83 | Ht 64.0 in

## 2018-09-04 DIAGNOSIS — G8929 Other chronic pain: Secondary | ICD-10-CM

## 2018-09-04 DIAGNOSIS — M25572 Pain in left ankle and joints of left foot: Secondary | ICD-10-CM | POA: Diagnosis not present

## 2018-09-04 DIAGNOSIS — M25571 Pain in right ankle and joints of right foot: Secondary | ICD-10-CM | POA: Diagnosis not present

## 2018-09-04 DIAGNOSIS — M999 Biomechanical lesion, unspecified: Secondary | ICD-10-CM | POA: Diagnosis not present

## 2018-09-04 DIAGNOSIS — M545 Low back pain: Secondary | ICD-10-CM | POA: Diagnosis not present

## 2018-09-04 NOTE — Progress Notes (Signed)
Julia ScaleZach Julia [name] D.O. [facility] [address] [place], [place] [address] Phone: [phone] Subjective:   Julia [name], Julia [name], am serving as a scribe for Dr. Antoine PrimasZachary Dudley [name].  I'm seeing this patient by the request  of:    CC: Left ankle pain follow-up, back pain follow-up  BJY:[id number]:Subjective  Julia FloodJennifer [name] is a 53 y.o. female coming in with complaint of left ankle pain. Is wearing Aircast. Unsure if it helps. Pain is over medial malleolus. Pain with weight bearing.  Patient did have significant amount of soft tissue swelling but did have increased vascularity it appeared.  Patient states that the swelling has improved.  Seems to be only when she does significant amount of increased activity such as mowing her lawn.    Past Medical History:  Diagnosis Date  . Asthma with allergic rhinitis   . Elevated liver enzymes   . Liver disease    Fatty liver  . Migraines   . Polycystic ovaries 1984  . Sleep apnea syndrome   . Subclinical hypothyroidism    Past Surgical History:  Procedure Laterality Date  . Laproscopy  2011  . LASER ABLATION  2012  . LITHOTRIPSY  2000   Social History   Socioeconomic History  . Marital status: Divorced    Spouse name: Not on file  . Number of children: Not on file  . Years of education: Not on file  . Highest education level: Not on file  Occupational History  . Not on file  Social Needs  . Financial resource strain: Not on file  . Food insecurity    Worry: Not on file    Inability: Not on file  . Transportation needs    Medical: Not on file    Non-medical: Not on file  Tobacco Use  . Smoking status: Never Smoker  . Smokeless tobacco: Never Used  Substance and Sexual Activity  . Alcohol use: No  . Drug use: No    Comment: Caffiene: None.  . Sexual activity: Not Currently    Partners: Male  Lifestyle  . Physical activity    Days per week: Not on file    Minutes per session: Not on file  . Stress: Not on file  Relationships  .  Social Musicianconnections    Talks on phone: Not on file    Gets together: Not on file    Attends religious service: Not on file    Active member of club or organization: Not on file    Attends meetings of clubs or organizations: Not on file    Relationship status: Not on file  Other Topics Concern  . Not on file  Social History Narrative  . Not on file   Allergies  Allergen Reactions  . Relafen [Nabumetone] Hives, Itching and Swelling  . Sulfa Antibiotics   . Clindamycin/Lincomycin Rash    Oral ONLY   Family History  Problem Relation Age of Onset  . Alcohol abuse Father   . Alcohol abuse Brother   . Coronary artery disease Maternal Grandmother   . Transient ischemic attack Maternal Grandmother   . Coronary artery disease Maternal Grandfather   . Heart attack Maternal Grandfather   . Transient ischemic attack Paternal Grandfather     Current Outpatient Medications (Endocrine & Metabolic):  .  levothyroxine (SYNTHROID) 25 MCG tablet, Take 25 mcg by mouth daily before breakfast. .  thyroid (ARMOUR) 60 MG tablet, Take 60 mg by mouth daily before breakfast.   Current Outpatient Medications (Respiratory):  .  albuterol (PROVENTIL HFA;VENTOLIN HFA) 108 (90 BASE) MCG/ACT inhaler, Inhale 2 puffs into the lungs every 6 (six) hours as needed. prn  .  levocetirizine (XYZAL) 5 MG tablet, Take 5 mg by mouth every evening. .  mometasone (ASMANEX) 220 MCG/INH inhaler, Inhale 1 puff into the lungs daily. Prescribed by Dr. Tempie DonningNastasi. .  montelukast (SINGULAIR) 10 MG tablet, Take 10 mg by mouth Daily. .  promethazine (PHENERGAN) 25 MG tablet, Take 1 tablet (25 mg total) by mouth every 6 (six) hours as needed for nausea or vomiting. .  SYMBICORT 80-4.5 MCG/ACT inhaler, Inhale 2 puffs into the lungs 2 (two) times daily.  Current Outpatient Medications (Analgesics):  .  zolmitriptan (ZOMIG) 5 MG tablet, Take 5 mg by mouth as needed. prn   Current Outpatient Medications (Hematological):  Marland Kitchen.   Cyanocobalamin 5000 MCG SUBL, Place under the tongue.  Current Outpatient Medications (Other):  Marland Kitchen.  5-Hydroxytryptophan (5-HTP) 100 MG CAPS, Take 1 capsule by mouth 2 (two) times daily.  .  Ascorbic Acid (VITAMIN C) 1000 MG tablet, Take 1,000 mg by mouth daily. .  Biotin 800 MCG TABS, Take 1 tablet by mouth daily. Marland Kitchen.  buPROPion (WELLBUTRIN XL) 150 MG 24 hr tablet, Take 1 tablet (150 mg total) by mouth daily. .  Coenzyme Q10 (CO Q-10) 200 MG CAPS, Take by mouth daily.  .  diazepam (VALIUM) 5 MG tablet, Take 1 tablet (5 mg total) by mouth every 12 (twelve) hours as needed. One tab by mouth, 2 hours before procedure. .  Diclofenac Sodium (PENNSAID) 2 % SOLN, Place 2 g onto the skin 2 (two) times daily. Marland Kitchen.  EVENING PRIMROSE OIL PO, Take 1.3 g by mouth daily. .  hydrOXYzine (ATARAX/VISTARIL) 25 MG tablet, Take 1 tablet (25 mg total) by mouth 3 (three) times daily as needed. Marland Kitchen.  L-Methylfolate (DEPLIN PO), Take 400 mcg by mouth 2 (two) times daily. Marland Kitchen.  MAGNESIUM CITRATE PO*, Take by mouth. .  metaxalone (SKELAXIN) 800 MG tablet, Take 800 mg by mouth as needed. .  metoCLOPramide (REGLAN) 10 MG tablet, Take 10 mg by mouth 4 (four) times daily. .  Multiple Vitamins-Minerals (ZINC PO), Take 1 capsule by mouth 2 (two) times daily. .  Omega-3 Fatty Acids (FISH OIL) 300 MG CAPS, Take 900 mg by mouth daily.  Marland Kitchen.  topiramate (TOPAMAX) 25 MG tablet, Take 75 mg by mouth at bedtime. .  vitamin A 1610925000 UNIT capsule, Take 25,000 Units by mouth 3 (three) times a week.  .  Vitamin D, Ergocalciferol, (DRISDOL) 1.25 MG (50000 UT) CAPS capsule, TAKE 1 CAP (50,000) UNITS BY MOUTH EVERY 7 DAYS .  vitamin E 400 UNIT capsule, Take 400 Units by mouth every other day. * These medications belong to multiple therapeutic classes and are listed under each applicable group.    Past medical history, social, surgical and family history all reviewed in electronic medical record.  No pertanent information unless stated regarding to the  chief complaint.   Review of Systems:  No headache, visual changes, nausea, vomiting, diarrhea, constipation, dizziness, abdominal pain, skin rash, fevers, chills, night sweats, weight loss, swollen lymph nodes, body aches, joint swelling,  chest pain, shortness of breath, mood changes.  Positive muscle aches no recent joint swelling  Objective  Blood pressure 120/76, pulse 83, height 5\' 4"  (1.626 m), SpO2 98 %.   General: No apparent distress alert and oriented x3 mood and affect normal, dressed appropriately.  HEENT: Pupils equal, extraocular movements intact  Respiratory: Patient's speak in  full sentences and does not appear short of breath  Cardiovascular: No lower extremity edema, non tender, no erythema  Skin: Warm dry intact with no signs of infection or rash on extremities or on axial skeleton.  Abdomen: Soft nontender  Neuro: Cranial nerves II through XII are intact, neurovascularly intact in all extremities with 2+ DTRs and 2+ pulses.  Lymph: No lymphadenopathy of posterior or anterior cervical chain or axillae bilaterally.  Gait mild antalgic MSK:  Non tender with full range of motion and good stability and symmetric strength and tone of shoulders, elbows, wrist, hip, knee and bilaterally.  Ankle exam shows the patient does not have any other swelling but still has significant abnormal the medial malleolus.  Patient does have some mild impingement with full dorsiflexion of the medial aspect of the talar dome.  Patient's ATFL does have some loosening but does have good femoral range of motion really of the ankle.  Negative Tinel's sign.  Neck exam shows some mild loss of lordosis with some mild increasing kyphosis of the upper back.  Patient does have some tenderness to palpation in the cervical thoracic region right greater than left.  Osteopathic findings  C2 flexed rotated and side bent right C4 flexed rotated and side bent left C6 flexed rotated and side bent left T3 extended  rotated and side bent right inhaled third rib T9 extended rotated and side bent left L2 flexed rotated and side bent right Sacrum right on right   Limited musculoskeletal ultrasound was performed and interpreted by Lyndal Pulley  Limited ultrasound patient soft tissue of the ankle and does not show any significant enlargement in the increase in Doppler flow is significantly improved.  Patient does have narrowing of the talar dome on the medial aspect of the ankle.  Very small trace effusion at that area as well.   Impression and Recommendations:     This case required medical decision making of moderate complexity. The above documentation has been reviewed and is accurate and complete Lyndal Pulley, DO       Note: This dictation was prepared with Dragon dictation along with smaller phrase technology. Any transcriptional errors that result from this process are unintentional.

## 2018-09-04 NOTE — Patient Instructions (Signed)
Wear brace with activity See you again in 4-6 weeks

## 2018-09-04 NOTE — Assessment & Plan Note (Signed)
Stable at the moment.  Discussed posture and ergonomics.  Patient has responded fairly well to osteopathic manipulation.  We will continue with the same current therapy.  No change in medications.  Follow-up again in 4 to 6 weeks

## 2018-09-04 NOTE — Assessment & Plan Note (Signed)
Decision today to treat with OMT was based on Physical Exam  After verbal consent patient was treated with HVLA, ME, FPR techniques in cervical, thoracic, rib lumbar and sacral areas  Patient tolerated the procedure well with improvement in symptoms  Patient given exercises, stretches and lifestyle modifications  See medications in patient instructions if given  Patient will follow up in 4-6 weeks 

## 2018-09-04 NOTE — Assessment & Plan Note (Signed)
Patient has had significantly worsening recurrent swelling.  The only thing that is concerning is a possible OCD.  Do not see anything significantly on ultrasound today.  Does have some probably underlying arthritic changes of the ankle.  Patient wants to continue with conservative therapy in an Aircast with repetitive activity.  Follow-up with me again 6 weeks

## 2018-09-10 ENCOUNTER — Encounter: Payer: Self-pay | Admitting: Family Medicine

## 2018-10-04 ENCOUNTER — Ambulatory Visit (INDEPENDENT_AMBULATORY_CARE_PROVIDER_SITE_OTHER): Payer: BC Managed Care – PPO | Admitting: Family Medicine

## 2018-10-04 ENCOUNTER — Encounter: Payer: Self-pay | Admitting: Family Medicine

## 2018-10-04 ENCOUNTER — Other Ambulatory Visit: Payer: Self-pay

## 2018-10-04 VITALS — BP 122/88 | HR 75 | Ht 64.0 in | Wt 187.0 lb

## 2018-10-04 DIAGNOSIS — M999 Biomechanical lesion, unspecified: Secondary | ICD-10-CM | POA: Diagnosis not present

## 2018-10-04 DIAGNOSIS — M542 Cervicalgia: Secondary | ICD-10-CM

## 2018-10-04 MED ORDER — TIZANIDINE HCL 4 MG PO TABS: 4.0000 mg | ORAL_TABLET | Freq: Every day | ORAL | 0 refills | Status: DC

## 2018-10-04 NOTE — Progress Notes (Signed)
[facility] [place],  [id number] Phone: [phone] Subjective:   Julia [name], am serving as a scribe for Dr. Hulan Saas.   CC: Back pain and neck pain follow-up  [id number]   09/04/2018: Patient has had significantly worsening recurrent swelling.  The only thing that is concerning is a possible OCD.  Do not see anything significantly on ultrasound today.  Does have some probably underlying arthritic changes of the ankle.  Patient wants to continue with conservative therapy in an Aircast with repetitive activity.  Follow-up with me again 6 weeks  Update 10/04/2018: Julia [name] is a 53 y.o. female coming in with complaint of left ankle pain and back pain. Patient states that the sharp pain in her ankle have gone away. Does wear brace with activity.   Has been working 12 hour days. Back pain increases with prolonged standing at work.  Patient denies any radiation of pain.  Is more of a soreness that is there all the time and worse with standing.      Past Medical History:  Diagnosis Date  . Asthma with allergic rhinitis   . Elevated liver enzymes   . Liver disease    Fatty liver  . Migraines   . Polycystic ovaries 1984  . Sleep apnea syndrome   . Subclinical hypothyroidism    Past Surgical History:  Procedure Laterality Date  . Laproscopy  2011  . LASER ABLATION  2012  . LITHOTRIPSY  2000   Social History   Socioeconomic History  . Marital status: Divorced    Spouse name: Not on file  . Number of children: Not on file  . Years of education: Not on file  . Highest education level: Not on file  Occupational History  . Not on file  Social Needs  . Financial resource strain: Not on file  . Food insecurity    Worry: Not on file    Inability: Not on file  . Transportation needs    Medical: Not on file    Non-medical: Not on file  Tobacco Use  . Smoking status: Never Smoker  . Smokeless tobacco: Never Used   Substance and Sexual Activity  . Alcohol use: [name]  . Drug use: [name]    Comment: Caffiene: None.  . Sexual activity: Not Currently    Partners: Male  Lifestyle  . Physical activity    Days per week: Not on file    Minutes per session: Not on file  . Stress: Not on file  Relationships  . Social Herbalist on phone: Not on file    Gets together: Not on file    Attends religious service: Not on file    Active member of club or organization: Not on file    Attends meetings of clubs or organizations: Not on file    Relationship status: Not on file  Other Topics Concern  . Not on file  Social History Narrative  . Not on file   Allergies  Allergen Reactions  . Relafen [Nabumetone] Hives, Itching and Swelling  . Sulfa Antibiotics   . Clindamycin/Lincomycin Rash    Oral ONLY   Family History  Problem Relation Age of Onset  . Alcohol abuse Father   . Alcohol abuse Brother   . Coronary artery disease Maternal Grandmother   . Transient ischemic attack Maternal Grandmother   . Coronary artery disease Maternal Grandfather   . Heart attack Maternal Grandfather   .  Transient ischemic attack Paternal Grandfather     Current Outpatient Medications (Endocrine & Metabolic):  .  levothyroxine (SYNTHROID) 25 MCG tablet, Take 25 mcg by mouth daily before breakfast. .  thyroid (ARMOUR) 60 MG tablet, Take 60 mg by mouth daily before breakfast.   Current Outpatient Medications (Respiratory):  .  albuterol (PROVENTIL HFA;VENTOLIN HFA) 108 (90 BASE) MCG/ACT inhaler, Inhale 2 puffs into the lungs every 6 (six) hours as needed. prn  .  levocetirizine (XYZAL) 5 MG tablet, Take 5 mg by mouth every evening. .  mometasone (ASMANEX) 220 MCG/INH inhaler, Inhale 1 puff into the lungs daily. Prescribed by Dr. Tempie DonningNastasi. .  montelukast (SINGULAIR) 10 MG tablet, Take 10 mg by mouth Daily. .  promethazine (PHENERGAN) 25 MG tablet, Take 1 tablet (25 mg total) by mouth every 6 (six) hours as needed  for nausea or vomiting. .  SYMBICORT 80-4.5 MCG/ACT inhaler, Inhale 2 puffs into the lungs 2 (two) times daily.  Current Outpatient Medications (Analgesics):  .  zolmitriptan (ZOMIG) 5 MG tablet, Take 5 mg by mouth as needed. prn   Current Outpatient Medications (Hematological):  Marland Kitchen.  Cyanocobalamin 5000 MCG SUBL, Place under the tongue.  Current Outpatient Medications (Other):  Marland Kitchen.  5-Hydroxytryptophan (5-HTP) 100 MG CAPS, Take 1 capsule by mouth 2 (two) times daily.  .  Ascorbic Acid (VITAMIN C) 1000 MG tablet, Take 1,000 mg by mouth daily. .  Biotin 800 MCG TABS, Take 1 tablet by mouth daily. Marland Kitchen.  buPROPion (WELLBUTRIN XL) 150 MG 24 hr tablet, Take 1 tablet (150 mg total) by mouth daily. .  Coenzyme Q10 (CO Q-10) 200 MG CAPS, Take by mouth daily.  .  diazepam (VALIUM) 5 MG tablet, Take 1 tablet (5 mg total) by mouth every 12 (twelve) hours as needed. One tab by mouth, 2 hours before procedure. .  Diclofenac Sodium (PENNSAID) 2 % SOLN, Place 2 g onto the skin 2 (two) times daily. Marland Kitchen.  EVENING PRIMROSE OIL PO, Take 1.3 g by mouth daily. .  hydrOXYzine (ATARAX/VISTARIL) 25 MG tablet, Take 1 tablet (25 mg total) by mouth 3 (three) times daily as needed. Marland Kitchen.  L-Methylfolate (DEPLIN PO), Take 400 mcg by mouth 2 (two) times daily. Marland Kitchen.  MAGNESIUM CITRATE PO*, Take by mouth. .  metaxalone (SKELAXIN) 800 MG tablet, Take 800 mg by mouth as needed. .  metoCLOPramide (REGLAN) 10 MG tablet, Take 10 mg by mouth 4 (four) times daily. .  Multiple Vitamins-Minerals (ZINC PO), Take 1 capsule by mouth 2 (two) times daily. .  Omega-3 Fatty Acids (FISH OIL) 300 MG CAPS, Take 900 mg by mouth daily.  Marland Kitchen.  topiramate (TOPAMAX) 25 MG tablet, Take 75 mg by mouth at bedtime. .  vitamin A [id number] UNIT capsule, Take 25,000 Units by mouth 3 (three) times a week.  .  Vitamin D, Ergocalciferol, (DRISDOL) 1.25 MG (50000 UT) CAPS capsule, TAKE 1 CAP (50,000) UNITS BY MOUTH EVERY 7 DAYS .  vitamin E 400 UNIT capsule, Take 400 Units by  mouth every other day. Marland Kitchen.  tiZANidine (ZANAFLEX) 4 MG tablet, Take 1 tablet (4 mg total) by mouth at bedtime. * These medications belong to multiple therapeutic classes and are listed under each applicable group.    Past medical history, social, surgical and family history all reviewed in electronic medical record.  [name] pertanent information unless stated regarding to the chief complaint.   Review of Systems:  [name] headache, visual changes, nausea, vomiting, diarrhea, constipation, dizziness, abdominal pain, skin rash, fevers,  chills, night sweats, weight loss, swollen lymph nodes, body aches, joint swelling,  chest pain, shortness of breath, mood changes.  Positive muscle aches  Objective  Blood pressure 122/88, pulse 75, height 5\' 4"  (1.626 m), weight 187 lb (84.8 kg), SpO2 98 %.    General: [name] apparent distress alert and oriented x3 mood and affect normal, dressed appropriately.  HEENT: Pupils equal, extraocular movements intact  Respiratory: Patient's speak in full sentences and does not appear short of breath  Cardiovascular: [name] lower extremity edema, non tender, [name] erythema  Skin: Warm dry intact with [name] signs of infection or rash on extremities or on axial skeleton.  Abdomen: Soft nontender  Neuro: Cranial nerves II through XII are intact, neurovascularly intact in all extremities with 2+ DTRs and 2+ pulses.  Lymph: [name] lymphadenopathy of posterior or anterior cervical chain or axillae bilaterally.  Gait normal with good balance and coordination.  MSK:  Non tender with full range of motion and good stability and symmetric strength and tone of shoulders, elbows, wrist, hip, knee and ankles bilaterally.  Back exam shows some loss of lordosis.  Tightness in the thoracolumbar junction greater than left.  Tightness noted in the paraspinal musculature lumbar spine negative straight leg test.  Positive Faber.  Osteopathic findings C2 flexed rotated and side bent right C6 flexed rotated and  side bent left T3 extended rotated and side bent right inhaled third rib T6 extended rotated and side bent left L4 flexed rotated and side bent left  Sacrum right on right     Impression and Recommendations:     This case required medical decision making of moderate complexity. The above documentation has been reviewed and is accurate and complete Judi SaaZachary M Micahel Omlor, DO       Note: This dictation was prepared with Dragon dictation along with smaller phrase technology. Any transcriptional errors that result from this process are unintentional.

## 2018-10-04 NOTE — Patient Instructions (Signed)
Stretch hip flexors at work If possible bend knees throughout work Zanaflex 1 pill at night as needed See me again in 4-6 weeks

## 2018-10-04 NOTE — Assessment & Plan Note (Signed)
Decision today to treat with OMT was based on Physical Exam  After verbal consent patient was treated with HVLA, ME, FPR techniques in cervical, thoracic, rib lumbar and sacral areas  Patient tolerated the procedure well with improvement in symptoms  Patient given exercises, stretches and lifestyle modifications  See medications in patient instructions if given  Patient will follow up in 4-5 weeks 

## 2018-10-04 NOTE — Assessment & Plan Note (Signed)
Discussed with patient about posture and ergonomics.  We discussed proper lifting mechanics if possible.  We discussed the potential of trying to find some type of standing mat that could help  RTC in 4-5 weeks

## 2018-11-08 ENCOUNTER — Other Ambulatory Visit: Payer: Self-pay

## 2018-11-08 ENCOUNTER — Encounter: Payer: Self-pay | Admitting: Family Medicine

## 2018-11-08 ENCOUNTER — Ambulatory Visit (INDEPENDENT_AMBULATORY_CARE_PROVIDER_SITE_OTHER): Payer: BC Managed Care – PPO | Admitting: Family Medicine

## 2018-11-08 VITALS — BP 110/82 | HR 78 | Ht 64.0 in | Wt 184.0 lb

## 2018-11-08 DIAGNOSIS — M545 Low back pain, unspecified: Secondary | ICD-10-CM

## 2018-11-08 DIAGNOSIS — G8929 Other chronic pain: Secondary | ICD-10-CM

## 2018-11-08 DIAGNOSIS — M999 Biomechanical lesion, unspecified: Secondary | ICD-10-CM | POA: Diagnosis not present

## 2018-11-08 NOTE — Assessment & Plan Note (Signed)
Decision today to treat with OMT was based on Physical Exam  After verbal consent patient was treated with HVLA, ME, FPR techniques in cervical, thoracic, rib lumbar and sacral areas  Patient tolerated the procedure well with improvement in symptoms  Patient given exercises, stretches and lifestyle modifications  See medications in patient instructions if given  Patient will follow up in 4-6 weeks 

## 2018-11-08 NOTE — Patient Instructions (Signed)
Ice is a good idea Pennsaid regularly for one month Maybe get new shoes See me in 4 weeks if not better will do GT injection

## 2018-11-08 NOTE — Progress Notes (Signed)
[name] [address] [place], [place] [address] Phone: [phone] Subjective:   [name], am serving as a scribe for Dr. Antoine Primas.  I'm seeing this patient by the request  of:    CC: Neck and back pain follow-up  JIR:[id number]  Julia [name] is a 53 y.o. female coming in with complaint of back pain. Last sen on 10/04/2018. Patient states that she is having pain towards the end of the day. Thinks that her pain is coming from her shoes. Is having neck pain today along with a migraine.       Past Medical History:  Diagnosis Date  . Asthma with allergic rhinitis   . Elevated liver enzymes   . Liver disease    Fatty liver  . Migraines   . Polycystic ovaries 1984  . Sleep apnea syndrome   . Subclinical hypothyroidism    Past Surgical History:  Procedure Laterality Date  . Laproscopy  2011  . LASER ABLATION  2012  . LITHOTRIPSY  2000   Social History   Socioeconomic History  . Marital status: Divorced    Spouse name: Not on file  . Number of children: Not on file  . Years of education: Not on file  . Highest education level: Not on file  Occupational History  . Not on file  Social Needs  . Financial resource strain: Not on file  . Food insecurity    Worry: Not on file    Inability: Not on file  . Transportation needs    Medical: Not on file    Non-medical: Not on file  Tobacco Use  . Smoking status: Never Smoker  . Smokeless tobacco: Never Used  Substance and Sexual Activity  . Alcohol use: No  . Drug use: No    Comment: Caffiene: None.  . Sexual activity: Not Currently    Partners: Male  Lifestyle  . Physical activity    Days per week: Not on file    Minutes per session: Not on file  . Stress: Not on file  Relationships  . Social Musician on phone: Not on file    Gets together: Not on file    Attends religious service: Not on file    Active member of club or organization: Not on file   Attends meetings of clubs or organizations: Not on file    Relationship status: Not on file  Other Topics Concern  . Not on file  Social History Narrative  . Not on file   Allergies  Allergen Reactions  . Relafen [Nabumetone] Hives, Itching and Swelling  . Sulfa Antibiotics   . Clindamycin/Lincomycin Rash    Oral ONLY   Family History  Problem Relation Age of Onset  . Alcohol abuse Father   . Alcohol abuse Brother   . Coronary artery disease Maternal Grandmother   . Transient ischemic attack Maternal Grandmother   . Coronary artery disease Maternal Grandfather   . Heart attack Maternal Grandfather   . Transient ischemic attack Paternal Grandfather     Current Outpatient Medications (Endocrine & Metabolic):  .  levothyroxine (SYNTHROID) 25 MCG tablet, Take 25 mcg by mouth daily before breakfast. .  thyroid (ARMOUR) 60 MG tablet, Take 60 mg by mouth daily before breakfast.   Current Outpatient Medications (Respiratory):  .  albuterol (PROVENTIL HFA;VENTOLIN HFA) 108 (90 BASE) MCG/ACT inhaler, Inhale 2 puffs into the lungs every 6 (six) hours as needed. prn  .  levocetirizine (XYZAL) 5 MG tablet, Take 5 mg by mouth every evening. .  mometasone (ASMANEX) 220 MCG/INH inhaler, Inhale 1 puff into the lungs daily. Prescribed by Dr. Tempie DonningNastasi. .  montelukast (SINGULAIR) 10 MG tablet, Take 10 mg by mouth Daily. .  promethazine (PHENERGAN) 25 MG tablet, Take 1 tablet (25 mg total) by mouth every 6 (six) hours as needed for nausea or vomiting. .  SYMBICORT 80-4.5 MCG/ACT inhaler, Inhale 2 puffs into the lungs 2 (two) times daily.  Current Outpatient Medications (Analgesics):  .  zolmitriptan (ZOMIG) 5 MG tablet, Take 5 mg by mouth as needed. prn   Current Outpatient Medications (Hematological):  Marland Kitchen.  Cyanocobalamin 5000 MCG SUBL, Place under the tongue.  Current Outpatient Medications (Other):  Marland Kitchen.  5-Hydroxytryptophan (5-HTP) 100 MG CAPS, Take 1 capsule by mouth 2 (two) times daily.  .   Ascorbic Acid (VITAMIN C) 1000 MG tablet, Take 1,000 mg by mouth daily. .  Biotin 800 MCG TABS, Take 1 tablet by mouth daily. Marland Kitchen.  buPROPion (WELLBUTRIN XL) 150 MG 24 hr tablet, Take 1 tablet (150 mg total) by mouth daily. .  Coenzyme Q10 (CO Q-10) 200 MG CAPS, Take by mouth daily.  .  diazepam (VALIUM) 5 MG tablet, Take 1 tablet (5 mg total) by mouth every 12 (twelve) hours as needed. One tab by mouth, 2 hours before procedure. .  Diclofenac Sodium (PENNSAID) 2 % SOLN, Place 2 g onto the skin 2 (two) times daily. Marland Kitchen.  EVENING PRIMROSE OIL PO, Take 1.3 g by mouth daily. .  hydrOXYzine (ATARAX/VISTARIL) 25 MG tablet, Take 1 tablet (25 mg total) by mouth 3 (three) times daily as needed. Marland Kitchen.  L-Methylfolate (DEPLIN PO), Take 400 mcg by mouth 2 (two) times daily. Marland Kitchen.  MAGNESIUM CITRATE PO*, Take by mouth. .  metaxalone (SKELAXIN) 800 MG tablet, Take 800 mg by mouth as needed. .  metoCLOPramide (REGLAN) 10 MG tablet, Take 10 mg by mouth 4 (four) times daily. .  Multiple Vitamins-Minerals (ZINC PO), Take 1 capsule by mouth 2 (two) times daily. .  Omega-3 Fatty Acids (FISH OIL) 300 MG CAPS, Take 900 mg by mouth daily.  Marland Kitchen.  tiZANidine (ZANAFLEX) 4 MG tablet, Take 1 tablet (4 mg total) by mouth at bedtime. .  topiramate (TOPAMAX) 25 MG tablet, Take 75 mg by mouth at bedtime. .  vitamin A 1478225000 UNIT capsule, Take 25,000 Units by mouth 3 (three) times a week.  .  Vitamin D, Ergocalciferol, (DRISDOL) 1.25 MG (50000 UT) CAPS capsule, TAKE 1 CAP (50,000) UNITS BY MOUTH EVERY 7 DAYS .  vitamin E 400 UNIT capsule, Take 400 Units by mouth every other day. * These medications belong to multiple therapeutic classes and are listed under each applicable group.    Past medical history, social, surgical and family history all reviewed in electronic medical record.  No pertanent information unless stated regarding to the chief complaint.   Review of Systems:  No headache, visual changes, nausea, vomiting, diarrhea,  constipation, dizziness, abdominal pain, skin rash, fevers, chills, night sweats, weight loss, swollen lymph nodes, body aches, joint swelling,  chest pain, shortness of breath, mood changes.  Positive muscle aches  Objective  Blood pressure 110/82, pulse 78, height 5\' 4"  (1.626 m), weight 184 lb (83.5 kg), SpO2 98 %.    General: No apparent distress alert and oriented x3 mood and affect normal, dressed appropriately.  HEENT: Pupils equal, extraocular movements intact  Respiratory: Patient's speak in full sentences and does not appear  short of breath  Cardiovascular: No lower extremity edema, non tender, no erythema  Skin: Warm dry intact with no signs of infection or rash on extremities or on axial skeleton.  Abdomen: Soft nontender  Neuro: Cranial nerves II through XII are intact, neurovascularly intact in all extremities with 2+ DTRs and 2+ pulses.  Lymph: No lymphadenopathy of posterior or anterior cervical chain or axillae bilaterally.  Gait normal with good balance and coordination.  MSK:  Non tender with full range of motion and good stability and symmetric strength and tone of shoulders, elbows, wrist, hip, knee and ankles bilaterally.  Back exam shows the patient does have increased kyphosis of the thoracic spine.  Loss of lordosis in the lumbar spine.  Tender to palpation in the paraspinal musculature lumbar spine right greater than left.  Osteopathic findings C2 flexed rotated and side bent right C4 flexed rotated and side bent left C6 flexed rotated and side bent left T3 extended rotated and side bent right inhaled third rib T9 extended rotated and side bent left L2 flexed rotated and side bent right Sacrum right on right     Impression and Recommendations:     This case required medical decision making of moderate complexity. The above documentation has been reviewed and is accurate and complete Lyndal Pulley, DO       Note: This dictation was prepared with Dragon  dictation along with smaller phrase technology. Any transcriptional errors that result from this process are unintentional.

## 2018-11-08 NOTE — Assessment & Plan Note (Signed)
Patient continues to have difficulty.  Patient does have more of a multifactorial back pain.  Seems to be work related, stress related, muscle imbalances as well.  Patient is responding somewhat to osteopathic manipulation.  No significant change in management otherwise of these problems.  Follow-up again in 4 to 6 weeks

## 2018-12-06 ENCOUNTER — Ambulatory Visit (INDEPENDENT_AMBULATORY_CARE_PROVIDER_SITE_OTHER): Payer: BC Managed Care – PPO | Admitting: Family Medicine

## 2018-12-06 ENCOUNTER — Encounter: Payer: Self-pay | Admitting: Family Medicine

## 2018-12-06 ENCOUNTER — Other Ambulatory Visit: Payer: Self-pay

## 2018-12-06 VITALS — BP 112/70 | HR 74 | Ht 64.0 in | Wt 184.0 lb

## 2018-12-06 DIAGNOSIS — G8929 Other chronic pain: Secondary | ICD-10-CM | POA: Diagnosis not present

## 2018-12-06 DIAGNOSIS — M999 Biomechanical lesion, unspecified: Secondary | ICD-10-CM | POA: Diagnosis not present

## 2018-12-06 DIAGNOSIS — M545 Low back pain: Secondary | ICD-10-CM

## 2018-12-06 NOTE — Patient Instructions (Signed)
IF no better in a week call us I will send meds Calcum pyruvate 1500 mg daily for 2 months  See me again in 5-6 weeks

## 2018-12-06 NOTE — Assessment & Plan Note (Signed)
Decision today to treat with OMT was based on Physical Exam  After verbal consent patient was treated with HVLA, ME, FPR techniques in cervical, thoracic, rib lumbar and sacral areas  Patient tolerated the procedure well with improvement in symptoms  Patient given exercises, stretches and lifestyle modifications  See medications in patient instructions if given  Patient will follow up in 4-8 weeks 

## 2018-12-06 NOTE — Assessment & Plan Note (Signed)
Low back pain at this time.  Discussed posture and ergonomics, discussed which activities of doing which was to avoid.  Patient also increase activity slowly over the course the neck several weeks.  Discussed if worsening symptoms occur consider the possibility of prednisone.  Follow-up with me again in 4 to 8 weeks

## 2018-12-06 NOTE — Progress Notes (Signed)
Julia ScaleZach Roseanne [name] D.O. [facility] [address] [place], [place] [address] Phone: [phone] Subjective:   I Julia NighKana [name] am serving as a Neurosurgeonscribe for Dr. Antoine PrimasZachary Obryan [name].   CC: Back pain  GEX:[id number]:Subjective  Julia FloodJennifer [name] is a 53 y.o. female coming in with complaint of back pain. States she feels about the same. Patient states that continues to have tightness noted.  Since she has been having to wear a mask she believes that this is hurting her back more.  Feels she may change her posture secondary to it.     Past Medical History:  Diagnosis Date  . Asthma with allergic rhinitis   . Elevated liver enzymes   . Liver disease    Fatty liver  . Migraines   . Polycystic ovaries 1984  . Sleep apnea syndrome   . Subclinical hypothyroidism    Past Surgical History:  Procedure Laterality Date  . Laproscopy  2011  . LASER ABLATION  2012  . LITHOTRIPSY  2000   Social History   Socioeconomic History  . Marital status: Divorced    Spouse name: Not on file  . Number of children: Not on file  . Years of education: Not on file  . Highest education level: Not on file  Occupational History  . Not on file  Social Needs  . Financial resource strain: Not on file  . Food insecurity    Worry: Not on file    Inability: Not on file  . Transportation needs    Medical: Not on file    Non-medical: Not on file  Tobacco Use  . Smoking status: Never Smoker  . Smokeless tobacco: Never Used  Substance and Sexual Activity  . Alcohol use: No  . Drug use: No    Comment: Caffiene: None.  . Sexual activity: Not Currently    Partners: Male  Lifestyle  . Physical activity    Days per week: Not on file    Minutes per session: Not on file  . Stress: Not on file  Relationships  . Social Musicianconnections    Talks on phone: Not on file    Gets together: Not on file    Attends religious service: Not on file    Active member of club or organization: Not on file    Attends meetings of clubs or  organizations: Not on file    Relationship status: Not on file  Other Topics Concern  . Not on file  Social History Narrative  . Not on file   Allergies  Allergen Reactions  . Relafen [Nabumetone] Hives, Itching and Swelling  . Sulfa Antibiotics   . Clindamycin/Lincomycin Rash    Oral ONLY   Family History  Problem Relation Age of Onset  . Alcohol abuse Father   . Alcohol abuse Brother   . Coronary artery disease Maternal Grandmother   . Transient ischemic attack Maternal Grandmother   . Coronary artery disease Maternal Grandfather   . Heart attack Maternal Grandfather   . Transient ischemic attack Paternal Grandfather     Current Outpatient Medications (Endocrine & Metabolic):  .  levothyroxine (SYNTHROID) 25 MCG tablet, Take 25 mcg by mouth daily before breakfast. .  thyroid (ARMOUR) 60 MG tablet, Take 60 mg by mouth daily before breakfast.   Current Outpatient Medications (Respiratory):  .  albuterol (PROVENTIL HFA;VENTOLIN HFA) 108 (90 BASE) MCG/ACT inhaler, Inhale 2 puffs into the lungs every 6 (six) hours as needed. prn  .  levocetirizine (XYZAL) 5 MG  tablet, Take 5 mg by mouth every evening. .  mometasone (ASMANEX) 220 MCG/INH inhaler, Inhale 1 puff into the lungs daily. Prescribed by Dr. Latricia Heft. .  montelukast (SINGULAIR) 10 MG tablet, Take 10 mg by mouth Daily. .  promethazine (PHENERGAN) 25 MG tablet, Take 1 tablet (25 mg total) by mouth every 6 (six) hours as needed for nausea or vomiting. .  SYMBICORT 80-4.5 MCG/ACT inhaler, Inhale 2 puffs into the lungs 2 (two) times daily.  Current Outpatient Medications (Analgesics):  .  zolmitriptan (ZOMIG) 5 MG tablet, Take 5 mg by mouth as needed. prn   Current Outpatient Medications (Hematological):  Marland Kitchen  Cyanocobalamin 5000 MCG SUBL, Place under the tongue.  Current Outpatient Medications (Other):  Marland Kitchen  5-Hydroxytryptophan (5-HTP) 100 MG CAPS, Take 1 capsule by mouth 2 (two) times daily.  .  Ascorbic Acid (VITAMIN C)  1000 MG tablet, Take 1,000 mg by mouth daily. .  Biotin 800 MCG TABS, Take 1 tablet by mouth daily. Marland Kitchen  buPROPion (WELLBUTRIN XL) 150 MG 24 hr tablet, Take 1 tablet (150 mg total) by mouth daily. .  Coenzyme Q10 (CO Q-10) 200 MG CAPS, Take by mouth daily.  .  diazepam (VALIUM) 5 MG tablet, Take 1 tablet (5 mg total) by mouth every 12 (twelve) hours as needed. One tab by mouth, 2 hours before procedure. .  Diclofenac Sodium (PENNSAID) 2 % SOLN, Place 2 g onto the skin 2 (two) times daily. Marland Kitchen  EVENING PRIMROSE OIL PO, Take 1.3 g by mouth daily. .  hydrOXYzine (ATARAX/VISTARIL) 25 MG tablet, Take 1 tablet (25 mg total) by mouth 3 (three) times daily as needed. Marland Kitchen  L-Methylfolate (DEPLIN PO), Take 400 mcg by mouth 2 (two) times daily. Marland Kitchen  MAGNESIUM CITRATE PO*, Take by mouth. .  metaxalone (SKELAXIN) 800 MG tablet, Take 800 mg by mouth as needed. .  metoCLOPramide (REGLAN) 10 MG tablet, Take 10 mg by mouth 4 (four) times daily. .  Multiple Vitamins-Minerals (ZINC PO), Take 1 capsule by mouth 2 (two) times daily. .  Omega-3 Fatty Acids (FISH OIL) 300 MG CAPS, Take 900 mg by mouth daily.  Marland Kitchen  tiZANidine (ZANAFLEX) 4 MG tablet, Take 1 tablet (4 mg total) by mouth at bedtime. .  topiramate (TOPAMAX) 25 MG tablet, Take 75 mg by mouth at bedtime. .  vitamin A 25000 UNIT capsule, Take 25,000 Units by mouth 3 (three) times a week.  .  Vitamin D, Ergocalciferol, (DRISDOL) 1.25 MG (50000 UT) CAPS capsule, TAKE 1 CAP (50,000) UNITS BY MOUTH EVERY 7 DAYS .  vitamin E 400 UNIT capsule, Take 400 Units by mouth every other day. * These medications belong to multiple therapeutic classes and are listed under each applicable group.    Past medical history, social, surgical and family history all reviewed in electronic medical record.  No pertanent information unless stated regarding to the chief complaint.   Review of Systems:  No headache, visual changes, nausea, vomiting, diarrhea, constipation, dizziness,  abdominal pain, skin rash, fevers, chills, night sweats, weight loss, swollen lymph nodes, body aches, joint swelling, muscle aches, chest pain, shortness of breath, mood changes.   Objective  Blood pressure 112/70, pulse 74, height 5\' 4"  (1.626 m), weight 184 lb (83.5 kg), SpO2 97 %. Systems examined below as of    General: No apparent distress alert and oriented x3 mood and affect normal, dressed appropriately.  HEENT: Pupils equal, extraocular movements intact  Respiratory: Patient's speak in full sentences and does not appear short  of breath  Cardiovascular: No lower extremity edema, non tender, no erythema  Skin: Warm dry intact with no signs of infection or rash on extremities or on axial skeleton.  Abdomen: Soft nontender  Neuro: Cranial nerves II through XII are intact, neurovascularly intact in all extremities with 2+ DTRs and 2+ pulses.  Lymph: No lymphadenopathy of posterior or anterior cervical chain or axillae bilaterally.  Gait normal with good balance and coordination.  MSK:  Non tender with full range of motion and good stability and symmetric strength and tone of shoulders, elbows, wrist, hip, knee and ankles bilaterally.  Back exam has some mild increase in kyphosis of the upper thoracic spine.  Loss of lordosis of the lumbar spine. Diffuse tenderness to palpation in the parascapular and paraspinal region of the back.  Left greater than right seems to be equal.  Tightness of the lower extremities bilaterally.    Impression and Recommendations:     This case required medical decision making of moderate complexity. The above documentation has been reviewed and is accurate and complete Judi Saa, DO       Note: This dictation was prepared with Dragon dictation along with smaller phrase technology. Any transcriptional errors that result from this process are unintentional.

## 2018-12-13 ENCOUNTER — Encounter: Payer: Self-pay | Admitting: Family Medicine

## 2018-12-24 ENCOUNTER — Other Ambulatory Visit: Payer: Self-pay

## 2018-12-24 MED ORDER — DICLOFENAC SODIUM 2 % TD SOLN: 2.0000 g | Freq: Two times a day (BID) | TRANSDERMAL | 3 refills | Status: DC

## 2019-01-17 ENCOUNTER — Encounter: Payer: Self-pay | Admitting: Family Medicine

## 2019-01-17 ENCOUNTER — Ambulatory Visit (INDEPENDENT_AMBULATORY_CARE_PROVIDER_SITE_OTHER): Payer: BC Managed Care – PPO | Admitting: Family Medicine

## 2019-01-17 ENCOUNTER — Other Ambulatory Visit: Payer: Self-pay

## 2019-01-17 VITALS — BP 120/84 | HR 81 | Ht 64.0 in | Wt 184.0 lb

## 2019-01-17 DIAGNOSIS — M545 Low back pain: Secondary | ICD-10-CM | POA: Diagnosis not present

## 2019-01-17 DIAGNOSIS — G8929 Other chronic pain: Secondary | ICD-10-CM

## 2019-01-17 DIAGNOSIS — M2140 Flat foot [pes planus] (acquired), unspecified foot: Secondary | ICD-10-CM

## 2019-01-17 DIAGNOSIS — M999 Biomechanical lesion, unspecified: Secondary | ICD-10-CM | POA: Diagnosis not present

## 2019-01-17 NOTE — Patient Instructions (Signed)
See me in 5-6 weeks 

## 2019-01-17 NOTE — Assessment & Plan Note (Signed)
Decision today to treat with OMT was based on Physical Exam  After verbal consent patient was treated with HVLA, ME, FPR techniques in cervical, thoracic, rib lumbar and sacral areas  Patient tolerated the procedure well with improvement in symptoms  Patient given exercises, stretches and lifestyle modifications  See medications in patient instructions if given  Patient will follow up in 4-8 weeks 

## 2019-01-17 NOTE — Assessment & Plan Note (Signed)
Continue to be chronic.  Continue to have pain that seems to be over the hip flexors more than anywhere else.  Patient does respond fairly well to osteopathic manipulation.  Continue with the posture and ergonomics.  Follow-up again in 4 to 8 weeks

## 2019-01-17 NOTE — Progress Notes (Signed)
Tawana ScaleZach Ladana [name] D.O. [facility] [address] [place], [place] [address] Phone: [phone]) [phone] Subjective:   Julia [name], Julia [name], am serving as a scribe for Dr. Antoine PrimasZachary Breylen [name].   CC: Low back pain follow-up, foot pain follow-up  KGM:[id number]:Subjective   12/06/2018 Low back pain at this time.  Discussed posture and ergonomics, discussed which activities of doing which was to avoid.  Patient also increase activity slowly over the course the neck several weeks.  Discussed if worsening symptoms occur consider the possibility of prednisone.  Follow-up with me again in 4 to 8 weeks  Update 01/17/2019 Julia FloodJennifer [name] is a 53 y.o. female coming in with complaint of neck and back pain. Patient states that she continues to have intermittent pain throughout her spine.   Also complaining of right plantar fasciitis pain. Patient has discontinued rowing which she feels may have caused her pain. Pain has been improving.  Patient feels that it could have been secondary to the rowing.  Denies any numbness.     Past Medical History:  Diagnosis Date  . Asthma with allergic rhinitis   . Elevated liver enzymes   . Liver disease    Fatty liver  . Migraines   . Polycystic ovaries 1984  . Sleep apnea syndrome   . Subclinical hypothyroidism    Past Surgical History:  Procedure Laterality Date  . Laproscopy  2011  . LASER ABLATION  2012  . LITHOTRIPSY  2000   Social History   Socioeconomic History  . Marital status: Divorced    Spouse name: Not on file  . Number of children: Not on file  . Years of education: Not on file  . Highest education level: Not on file  Occupational History  . Not on file  Social Needs  . Financial resource strain: Not on file  . Food insecurity    Worry: Not on file    Inability: Not on file  . Transportation needs    Medical: Not on file    Non-medical: Not on file  Tobacco Use  . Smoking status: Never Smoker  . Smokeless tobacco: Never Used  Substance and Sexual  Activity  . Alcohol use: No  . Drug use: No    Comment: Caffiene: None.  . Sexual activity: Not Currently    Partners: Male  Lifestyle  . Physical activity    Days per week: Not on file    Minutes per session: Not on file  . Stress: Not on file  Relationships  . Social Musicianconnections    Talks on phone: Not on file    Gets together: Not on file    Attends religious service: Not on file    Active member of club or organization: Not on file    Attends meetings of clubs or organizations: Not on file    Relationship status: Not on file  Other Topics Concern  . Not on file  Social History Narrative  . Not on file   Allergies  Allergen Reactions  . Relafen [Nabumetone] Hives, Itching and Swelling  . Sulfa Antibiotics   . Clindamycin/Lincomycin Rash    Oral ONLY   Family History  Problem Relation Age of Onset  . Alcohol abuse Father   . Alcohol abuse Brother   . Coronary artery disease Maternal Grandmother   . Transient ischemic attack Maternal Grandmother   . Coronary artery disease Maternal Grandfather   . Heart attack Maternal Grandfather   . Transient ischemic attack Paternal Grandfather  Current Outpatient Medications (Endocrine & Metabolic):  .  levothyroxine (SYNTHROID) 25 MCG tablet, Take 25 mcg by mouth daily before breakfast. .  thyroid (ARMOUR) 60 MG tablet, Take 60 mg by mouth daily before breakfast.   Current Outpatient Medications (Respiratory):  .  albuterol (PROVENTIL HFA;VENTOLIN HFA) 108 (90 BASE) MCG/ACT inhaler, Inhale 2 puffs into the lungs every 6 (six) hours as needed. prn  .  levocetirizine (XYZAL) 5 MG tablet, Take 5 mg by mouth every evening. .  mometasone (ASMANEX) 220 MCG/INH inhaler, Inhale 1 puff into the lungs daily. Prescribed by Dr. Latricia Heft. .  montelukast (SINGULAIR) 10 MG tablet, Take 10 mg by mouth Daily. .  promethazine (PHENERGAN) 25 MG tablet, Take 1 tablet (25 mg total) by mouth every 6 (six) hours as needed for nausea or vomiting.  .  SYMBICORT 80-4.5 MCG/ACT inhaler, Inhale 2 puffs into the lungs 2 (two) times daily.  Current Outpatient Medications (Analgesics):  .  zolmitriptan (ZOMIG) 5 MG tablet, Take 5 mg by mouth as needed. prn   Current Outpatient Medications (Hematological):  Marland Kitchen  Cyanocobalamin 5000 MCG SUBL, Place under the tongue.  Current Outpatient Medications (Other):  Marland Kitchen  5-Hydroxytryptophan (5-HTP) 100 MG CAPS, Take 1 capsule by mouth 2 (two) times daily.  .  Ascorbic Acid (VITAMIN C) 1000 MG tablet, Take 1,000 mg by mouth daily. .  Biotin 800 MCG TABS, Take 1 tablet by mouth daily. Marland Kitchen  buPROPion (WELLBUTRIN XL) 150 MG 24 hr tablet, Take 1 tablet (150 mg total) by mouth daily. .  Coenzyme Q10 (CO Q-10) 200 MG CAPS, Take by mouth daily.  .  diazepam (VALIUM) 5 MG tablet, Take 1 tablet (5 mg total) by mouth every 12 (twelve) hours as needed. One tab by mouth, 2 hours before procedure. .  Diclofenac Sodium (PENNSAID) 2 % SOLN, Place 2 g onto the skin 2 (two) times daily. .  Diclofenac Sodium 2 % SOLN, Place 2 g onto the skin 2 (two) times daily. Marland Kitchen  EVENING PRIMROSE OIL PO, Take 1.3 g by mouth daily. .  hydrOXYzine (ATARAX/VISTARIL) 25 MG tablet, Take 1 tablet (25 mg total) by mouth 3 (three) times daily as needed. Marland Kitchen  L-Methylfolate (DEPLIN PO), Take 400 mcg by mouth 2 (two) times daily. Marland Kitchen  MAGNESIUM CITRATE PO*, Take by mouth. .  metaxalone (SKELAXIN) 800 MG tablet, Take 800 mg by mouth as needed. .  metoCLOPramide (REGLAN) 10 MG tablet, Take 10 mg by mouth 4 (four) times daily. .  Multiple Vitamins-Minerals (ZINC PO), Take 1 capsule by mouth 2 (two) times daily. .  Omega-3 Fatty Acids (FISH OIL) 300 MG CAPS, Take 900 mg by mouth daily.  Marland Kitchen  tiZANidine (ZANAFLEX) 4 MG tablet, Take 1 tablet (4 mg total) by mouth at bedtime. .  topiramate (TOPAMAX) 25 MG tablet, Take 75 mg by mouth at bedtime. .  vitamin A 25000 UNIT capsule, Take 25,000 Units by mouth 3 (three) times a week.  .  Vitamin D, Ergocalciferol,  (DRISDOL) 1.25 MG (50000 UT) CAPS capsule, TAKE 1 CAP (50,000) UNITS BY MOUTH EVERY 7 DAYS .  vitamin E 400 UNIT capsule, Take 400 Units by mouth every other day. * These medications belong to multiple therapeutic classes and are listed under each applicable group.    Past medical history, social, surgical and family history all reviewed in electronic medical record.  No pertanent information unless stated regarding to the chief complaint.   Review of Systems:  No headache, visual changes, nausea, vomiting,  diarrhea, constipation, dizziness, abdominal pain, skin rash, fevers, chills, night sweats, weight loss, swollen lymph nodes, body aches, joint swelling,  chest pain, shortness of breath, mood changes.  Positive muscle aches  Objective  Blood pressure 120/84, pulse 81, height 5\' 4"  (1.626 m), weight 184 lb (83.5 kg), SpO2 98 %.  General: No apparent distress alert and oriented x3 mood and affect normal, dressed appropriately.  HEENT: Pupils equal, extraocular movements intact  Respiratory: Patient's speak in full sentences and does not appear short of breath  Cardiovascular: No lower extremity edema, non tender, no erythema  Skin: Warm dry intact with no signs of infection or rash on extremities or on axial skeleton.  Abdomen: Soft nontender  Neuro: Cranial nerves II through XII are intact, neurovascularly intact in all extremities with 2+ DTRs and 2+ pulses.  Lymph: No lymphadenopathy of posterior or anterior cervical chain or axillae bilaterally.  Gait normal with good balance and coordination.  MSK:  tender with full range of motion and good stability and symmetric strength and tone of shoulders, elbows, wrist, hip, knee and ankles bilaterally.  Patient is upper back seems to have some mild increase in kyphosis.  Tender in the thoracolumbar juncture right greater than left.  Tightness to hip flexor right greater than left.  Foot exam shows the patient does have severe pes planus  bilaterally right greater than left.  Patient does have tenderness over the medial calcaneal area and minor on the plantar aspect of the foot.  Osteopathic findings C2 flexed rotated and side bent right C4 flexed rotated and side bent left C6 flexed rotated and side bent left T3 extended rotated and side bent right inhaled third rib T9 extended rotated and side bent left L2 flexed rotated and side bent right Sacrum right on right     Impression and Recommendations:     This case required medical decision making of moderate complexity. The above documentation has been reviewed and is accurate and complete , DO       Note: This dictation was prepared with Dragon dictation along with smaller phrase technology. Any transcriptional errors that result from this process are unintentional.

## 2019-01-17 NOTE — Assessment & Plan Note (Signed)
Discussed worsening symptoms we will need to consider the possibility of injection.

## 2019-02-15 ENCOUNTER — Encounter: Payer: Self-pay | Admitting: *Deleted

## 2019-02-19 ENCOUNTER — Ambulatory Visit: Payer: BC Managed Care – PPO | Admitting: Family Medicine

## 2019-03-12 ENCOUNTER — Encounter: Payer: Self-pay | Admitting: Family Medicine

## 2019-03-12 ENCOUNTER — Ambulatory Visit (INDEPENDENT_AMBULATORY_CARE_PROVIDER_SITE_OTHER): Payer: BC Managed Care – PPO | Admitting: Family Medicine

## 2019-03-12 ENCOUNTER — Other Ambulatory Visit: Payer: Self-pay

## 2019-03-12 VITALS — BP 118/86 | HR 75 | Ht 64.0 in | Wt 180.0 lb

## 2019-03-12 DIAGNOSIS — M542 Cervicalgia: Secondary | ICD-10-CM | POA: Diagnosis not present

## 2019-03-12 DIAGNOSIS — M999 Biomechanical lesion, unspecified: Secondary | ICD-10-CM

## 2019-03-12 NOTE — Patient Instructions (Addendum)
Try to be more active See me again in 5-6 weeks

## 2019-03-12 NOTE — Progress Notes (Signed)
[place] Phone: [phone] Subjective:   [name], am serving as a scribe for Dr. Hulan Saas. This visit occurred during the SARS-CoV-2 public health emergency.  Safety protocols were in place, including screening questions prior to the visit, additional usage of staff PPE, and extensive cleaning of exam room while observing appropriate contact time as indicated for disinfecting solutions.    CC: Neck and back pain follow-up  VVO:[id number]  Julia [name] is a 54 y.o. female coming in with complaint of back pain. Last seen on 01/17/2019 for OMT. Patient states that she has not been doing any yardwork or rowing. Feels plantar fasciitis is better as well as her hip pain. Patient would like to become more active though without increasing her pain.  Patient continues to have some discomfort and pain but nothing as severe as what she has had in the past.  Patient notes that she will be working more with frequently here in the near future with her.       Past Medical History:  Diagnosis Date  . Asthma with allergic rhinitis   . Elevated liver enzymes   . Liver disease    Fatty liver  . Migraines   . Polycystic ovaries 1984  . Sleep apnea syndrome   . Subclinical hypothyroidism    Past Surgical History:  Procedure Laterality Date  . Laproscopy  2011  . LASER ABLATION  2012  . LITHOTRIPSY  2000   Social History   Socioeconomic History  . Marital status: Divorced    Spouse name: Not on file  . Number of children: Not on file  . Years of education: Not on file  . Highest education level: Not on file  Occupational History  . Not on file  Tobacco Use  . Smoking status: Never Smoker  . Smokeless tobacco: Never Used  Substance and Sexual Activity  . Alcohol use: No  . Drug use: No    Comment: Caffiene: None.  . Sexual activity: Not Currently    Partners: Male  Other Topics Concern  . Not on  file  Social History Narrative  . Not on file   Social Determinants of Health   Financial Resource Strain:   . Difficulty of Paying Living Expenses: Not on file  Food Insecurity:   . Worried About Charity fundraiser in the Last Year: Not on file  . Ran Out of Food in the Last Year: Not on file  Transportation Needs:   . Lack of Transportation (Medical): Not on file  . Lack of Transportation (Non-Medical): Not on file  Physical Activity:   . Days of Exercise per Week: Not on file  . Minutes of Exercise per Session: Not on file  Stress:   . Feeling of Stress : Not on file  Social Connections:   . Frequency of Communication with Friends and Family: Not on file  . Frequency of Social Gatherings with Friends and Family: Not on file  . Attends Religious Services: Not on file  . Active Member of Clubs or Organizations: Not on file  . Attends Archivist Meetings: Not on file  . Marital Status: Not on file   Allergies  Allergen Reactions  . Relafen [Nabumetone] Hives, Itching and Swelling  . Sulfa Antibiotics   . Clindamycin/Lincomycin Rash    Oral ONLY   Family History  Problem Relation Age of Onset  . Alcohol abuse Father   .  Alcohol abuse Brother   . Coronary artery disease Maternal Grandmother   . Transient ischemic attack Maternal Grandmother   . Coronary artery disease Maternal Grandfather   . Heart attack Maternal Grandfather   . Transient ischemic attack Paternal Grandfather     Current Outpatient Medications (Endocrine & Metabolic):  .  levothyroxine (SYNTHROID) 25 MCG tablet, Take 25 mcg by mouth daily before breakfast. .  thyroid (ARMOUR) 60 MG tablet, Take 60 mg by mouth daily before breakfast.   Current Outpatient Medications (Respiratory):  .  albuterol (PROVENTIL HFA;VENTOLIN HFA) 108 (90 BASE) MCG/ACT inhaler, Inhale 2 puffs into the lungs every 6 (six) hours as needed. prn  .  levocetirizine (XYZAL) 5 MG tablet, Take 5 mg by mouth every  evening. .  mometasone (ASMANEX) 220 MCG/INH inhaler, Inhale 1 puff into the lungs daily. Prescribed by Dr. Tempie Donning. .  montelukast (SINGULAIR) 10 MG tablet, Take 10 mg by mouth Daily. .  promethazine (PHENERGAN) 25 MG tablet, Take 1 tablet (25 mg total) by mouth every 6 (six) hours as needed for nausea or vomiting. .  SYMBICORT 80-4.5 MCG/ACT inhaler, Inhale 2 puffs into the lungs 2 (two) times daily.  Current Outpatient Medications (Analgesics):  .  zolmitriptan (ZOMIG) 5 MG tablet, Take 5 mg by mouth as needed. prn   Current Outpatient Medications (Hematological):  Marland Kitchen  Cyanocobalamin 5000 MCG SUBL, Place under the tongue.  Current Outpatient Medications (Other):  Marland Kitchen  5-Hydroxytryptophan (5-HTP) 100 MG CAPS, Take 1 capsule by mouth 2 (two) times daily.  .  Ascorbic Acid (VITAMIN C) 1000 MG tablet, Take 1,000 mg by mouth daily. .  Biotin 800 MCG TABS, Take 1 tablet by mouth daily. Marland Kitchen  buPROPion (WELLBUTRIN XL) 150 MG 24 hr tablet, Take 1 tablet (150 mg total) by mouth daily. .  Coenzyme Q10 (CO Q-10) 200 MG CAPS, Take by mouth daily.  .  diazepam (VALIUM) 5 MG tablet, Take 1 tablet (5 mg total) by mouth every 12 (twelve) hours as needed. One tab by mouth, 2 hours before procedure. .  Diclofenac Sodium (PENNSAID) 2 % SOLN, Place 2 g onto the skin 2 (two) times daily. .  Diclofenac Sodium 2 % SOLN, Place 2 g onto the skin 2 (two) times daily. Marland Kitchen  EVENING PRIMROSE OIL PO, Take 1.3 g by mouth daily. .  hydrOXYzine (ATARAX/VISTARIL) 25 MG tablet, Take 1 tablet (25 mg total) by mouth 3 (three) times daily as needed. Marland Kitchen  L-Methylfolate (DEPLIN PO), Take 400 mcg by mouth 2 (two) times daily. Marland Kitchen  MAGNESIUM CITRATE PO*, Take by mouth. .  metaxalone (SKELAXIN) 800 MG tablet, Take 800 mg by mouth as needed. .  metoCLOPramide (REGLAN) 10 MG tablet, Take 10 mg by mouth 4 (four) times daily. .  Multiple Vitamins-Minerals (ZINC PO), Take 1 capsule by mouth 2 (two) times daily. .  Omega-3 Fatty Acids (FISH  OIL) 300 MG CAPS, Take 900 mg by mouth daily.  Marland Kitchen  tiZANidine (ZANAFLEX) 4 MG tablet, Take 1 tablet (4 mg total) by mouth at bedtime. .  topiramate (TOPAMAX) 25 MG tablet, Take 75 mg by mouth at bedtime. .  vitamin A 62130 UNIT capsule, Take 25,000 Units by mouth 3 (three) times a week.  .  Vitamin D, Ergocalciferol, (DRISDOL) 1.25 MG (50000 UT) CAPS capsule, TAKE 1 CAP (50,000) UNITS BY MOUTH EVERY 7 DAYS .  vitamin E 400 UNIT capsule, Take 400 Units by mouth every other day. * These medications belong to multiple therapeutic classes and  are listed under each applicable group.    Past medical history, social, surgical and family history all reviewed in electronic medical record.  No pertanent information unless stated regarding to the chief complaint.   Review of Systems:  No headache, visual changes, nausea, vomiting, diarrhea, constipation, dizziness, abdominal pain, skin rash, fevers, chills, night sweats, weight loss, swollen lymph nodes, body aches, joint swelling, muscle aches, chest pain, shortness of breath, mood changes.   Objective  Blood pressure 118/86, pulse 75, height 5\' 4"  (1.626 m), weight 180 lb (81.6 kg), SpO2 97 %.    General: No apparent distress alert and oriented x3 mood and affect normal, dressed appropriately.  HEENT: Pupils equal, extraocular movements intact  Respiratory: Patient's speak in full sentences and does not appear short of breath  Cardiovascular: No lower extremity edema, non tender, no erythema  Skin: Warm dry intact with no signs of infection or rash on extremities or on axial skeleton.  Abdomen: Soft nontender  Neuro: Cranial nerves II through XII are intact, neurovascularly intact in all extremities with 2+ DTRs and 2+ pulses.  Lymph: No lymphadenopathy of posterior or anterior cervical chain or axillae bilaterally.  Gait normal with good balance and coordination.  MSK:  tender with limited range of motion and good stability and symmetric strength  and tone of shoulders, elbows, wrist, hip, knee and ankles bilaterally.   Back exam tenderness to palpation over the sacroiliac joint.  Tender to palpation in the paraspinal musculature lumbar spine right greater than left. Negative straight leg test.  Tightness in the parascapular region.  Patient has more pain to palpation in the neck area.  Osteopathic findings C2 flexed rotated and side bent right C7 flexed rotated and side bent left T3 extended rotated and side bent right inhaled third rib T9 extended rotated and side bent left L2 flexed rotated and side bent right Sacrum right on right     Impression and Recommendations:     This case required medical decision making of moderate complexity. The above documentation has been reviewed and is accurate and complete , DO       Note: This dictation was prepared with Dragon dictation along with smaller phrase technology. Any transcriptional errors that result from this process are unintentional.

## 2019-03-12 NOTE — Assessment & Plan Note (Signed)
Decision today to treat with OMT was based on Physical Exam  After verbal consent patient was treated with HVLA, ME, FPR techniques in cervical, thoracic, rib,  lumbar and sacral areas  Patient tolerated the procedure well with improvement in symptoms  Patient given exercises, stretches and lifestyle modifications  See medications in patient instructions if given  Patient will follow up in 4-8 weeks 

## 2019-03-12 NOTE — Assessment & Plan Note (Signed)
Tightness secondary to patient's posture and ergonomics, which activities to do which wants to avoid.  Topical anti-inflammatories.  Strengthening in the parascapular region.  Follow-up again in 4 to 8 weeks

## 2019-04-18 ENCOUNTER — Ambulatory Visit: Payer: BC Managed Care – PPO | Admitting: Family Medicine

## 2019-04-25 ENCOUNTER — Ambulatory Visit: Payer: BC Managed Care – PPO | Admitting: Family Medicine

## 2019-05-02 ENCOUNTER — Ambulatory Visit (INDEPENDENT_AMBULATORY_CARE_PROVIDER_SITE_OTHER): Payer: BC Managed Care – PPO | Admitting: Family Medicine

## 2019-05-02 ENCOUNTER — Encounter: Payer: Self-pay | Admitting: Family Medicine

## 2019-05-02 ENCOUNTER — Other Ambulatory Visit: Payer: Self-pay

## 2019-05-02 VITALS — BP 110/84 | HR 74 | Ht 64.0 in | Wt 180.0 lb

## 2019-05-02 DIAGNOSIS — M542 Cervicalgia: Secondary | ICD-10-CM

## 2019-05-02 DIAGNOSIS — M999 Biomechanical lesion, unspecified: Secondary | ICD-10-CM

## 2019-05-02 NOTE — Assessment & Plan Note (Signed)
Decision today to treat with OMT was based on Physical Exam  After verbal consent patient was treated with HVLA, ME, FPR techniques in cervical, thoracic, rib,  lumbar and sacral areas  Patient tolerated the procedure well with improvement in symptoms  Patient given exercises, stretches and lifestyle modifications  See medications in patient instructions if given  Patient will follow up in 4-8 weeks 

## 2019-05-02 NOTE — Patient Instructions (Signed)
Half pad of baclofen during the day Increase COQ10 after vaccination See me again in 3-4 weeks

## 2019-05-02 NOTE — Progress Notes (Signed)
[name] [address] [place] [address] Phone: [phone] Subjective:   [name], am serving as a scribe for Dr. Antoine Primas. This visit occurred during the SARS-CoV-2 public health emergency.  Safety protocols were in place, including screening questions prior to the visit, additional usage of staff PPE, and extensive cleaning of exam room while observing appropriate contact time as indicated for disinfecting solutions.   I'm seeing this patient by the request  of:  Swaney, Harl Bowie, MD  CC: Neck and back pain follow-up  [id number]  Julia [name] is a 54 y.o. female coming in with complaint of neck and back pain. Today she is having a migraine headache.   Monday had achy pain but yesterday twisted knee. States that pain is deep within the joint. Notices a "pinch". Denies any catching or locking. Patient has been more cautious of her movements. Pain increased yesterday when she was at work yesterday having to pivot in one spot all day.     Past Medical History:  Diagnosis Date  . Asthma with allergic rhinitis   . Elevated liver enzymes   . Liver disease    Fatty liver  . Migraines   . Polycystic ovaries 1984  . Sleep apnea syndrome   . Subclinical hypothyroidism    Past Surgical History:  Procedure Laterality Date  . Laproscopy  2011  . LASER ABLATION  2012  . LITHOTRIPSY  2000   Social History   Socioeconomic History  . Marital status: Divorced    Spouse name: Not on file  . Number of children: Not on file  . Years of education: Not on file  . Highest education level: Not on file  Occupational History  . Not on file  Tobacco Use  . Smoking status: Never Smoker  . Smokeless tobacco: Never Used  Substance and Sexual Activity  . Alcohol use: No  . Drug use: No    Comment: Caffiene: None.  . Sexual activity: Not Currently    Partners: Male  Other Topics Concern  . Not on file  Social History Narrative    . Not on file   Social Determinants of Health   Financial Resource Strain:   . Difficulty of Paying Living Expenses: Not on file  Food Insecurity:   . Worried About Programme researcher, broadcasting/film/video in the Last Year: Not on file  . Ran Out of Food in the Last Year: Not on file  Transportation Needs:   . Lack of Transportation (Medical): Not on file  . Lack of Transportation (Non-Medical): Not on file  Physical Activity:   . Days of Exercise per Week: Not on file  . Minutes of Exercise per Session: Not on file  Stress:   . Feeling of Stress : Not on file  Social Connections:   . Frequency of Communication with Friends and Family: Not on file  . Frequency of Social Gatherings with Friends and Family: Not on file  . Attends Religious Services: Not on file  . Active Member of Clubs or Organizations: Not on file  . Attends Banker Meetings: Not on file  . Marital Status: Not on file   Allergies  Allergen Reactions  . Relafen [Nabumetone] Hives, Itching and Swelling  . Sulfa Antibiotics   . Clindamycin/Lincomycin Rash    Oral ONLY   Family History  Problem Relation Age of Onset  . Alcohol abuse Father   . Alcohol abuse Brother   . Coronary  artery disease Maternal Grandmother   . Transient ischemic attack Maternal Grandmother   . Coronary artery disease Maternal Grandfather   . Heart attack Maternal Grandfather   . Transient ischemic attack Paternal Grandfather     Current Outpatient Medications (Endocrine & Metabolic):  .  levothyroxine (SYNTHROID) 25 MCG tablet, Take 25 mcg by mouth daily before breakfast. .  thyroid (ARMOUR) 60 MG tablet, Take 60 mg by mouth daily before breakfast.   Current Outpatient Medications (Respiratory):  .  albuterol (PROVENTIL HFA;VENTOLIN HFA) 108 (90 BASE) MCG/ACT inhaler, Inhale 2 puffs into the lungs every 6 (six) hours as needed. prn  .  levocetirizine (XYZAL) 5 MG tablet, Take 5 mg by mouth every evening. .  mometasone (ASMANEX) 220  MCG/INH inhaler, Inhale 1 puff into the lungs daily. Prescribed by Dr. Tempie Donning. .  montelukast (SINGULAIR) 10 MG tablet, Take 10 mg by mouth Daily. .  promethazine (PHENERGAN) 25 MG tablet, Take 1 tablet (25 mg total) by mouth every 6 (six) hours as needed for nausea or vomiting. .  SYMBICORT 80-4.5 MCG/ACT inhaler, Inhale 2 puffs into the lungs 2 (two) times daily.  Current Outpatient Medications (Analgesics):  .  zolmitriptan (ZOMIG) 5 MG tablet, Take 5 mg by mouth as needed. prn   Current Outpatient Medications (Hematological):  Marland Kitchen  Cyanocobalamin 5000 MCG SUBL, Place under the tongue.  Current Outpatient Medications (Other):  Marland Kitchen  5-Hydroxytryptophan (5-HTP) 100 MG CAPS, Take 1 capsule by mouth 2 (two) times daily.  .  Ascorbic Acid (VITAMIN C) 1000 MG tablet, Take 1,000 mg by mouth daily. .  Biotin 800 MCG TABS, Take 1 tablet by mouth daily. Marland Kitchen  buPROPion (WELLBUTRIN XL) 150 MG 24 hr tablet, Take 1 tablet (150 mg total) by mouth daily. .  Coenzyme Q10 (CO Q-10) 200 MG CAPS, Take by mouth daily.  .  diazepam (VALIUM) 5 MG tablet, Take 1 tablet (5 mg total) by mouth every 12 (twelve) hours as needed. One tab by mouth, 2 hours before procedure. .  Diclofenac Sodium (PENNSAID) 2 % SOLN, Place 2 g onto the skin 2 (two) times daily. .  Diclofenac Sodium 2 % SOLN, Place 2 g onto the skin 2 (two) times daily. Marland Kitchen  EVENING PRIMROSE OIL PO, Take 1.3 g by mouth daily. .  hydrOXYzine (ATARAX/VISTARIL) 25 MG tablet, Take 1 tablet (25 mg total) by mouth 3 (three) times daily as needed. Marland Kitchen  L-Methylfolate (DEPLIN PO), Take 400 mcg by mouth 2 (two) times daily. Marland Kitchen  MAGNESIUM CITRATE PO*, Take by mouth. .  metaxalone (SKELAXIN) 800 MG tablet, Take 800 mg by mouth as needed. .  metoCLOPramide (REGLAN) 10 MG tablet, Take 10 mg by mouth 4 (four) times daily. .  Multiple Vitamins-Minerals (ZINC PO), Take 1 capsule by mouth 2 (two) times daily. .  Omega-3 Fatty Acids (FISH OIL) 300 MG CAPS, Take 900 mg by mouth  daily.  Marland Kitchen  tiZANidine (ZANAFLEX) 4 MG tablet, Take 1 tablet (4 mg total) by mouth at bedtime. .  topiramate (TOPAMAX) 25 MG tablet, Take 75 mg by mouth at bedtime. .  vitamin A 08657 UNIT capsule, Take 25,000 Units by mouth 3 (three) times a week.  .  Vitamin D, Ergocalciferol, (DRISDOL) 1.25 MG (50000 UT) CAPS capsule, TAKE 1 CAP (50,000) UNITS BY MOUTH EVERY 7 DAYS .  vitamin E 400 UNIT capsule, Take 400 Units by mouth every other day. * These medications belong to multiple therapeutic classes and are listed under each applicable group.  Reviewed prior external information including notes and imaging from  primary care provider As well as notes that were available from care everywhere and other healthcare systems.  Past medical history, social, surgical and family history all reviewed in electronic medical record.  No pertanent information unless stated regarding to the chief complaint.   Review of Systems:  No  visual changes, nausea, vomiting, diarrhea, constipation, dizziness, abdominal pain, skin rash, fevers, chills, night sweats, weight loss, swollen lymph nodes,  joint swelling, chest pain, shortness of breath, mood changes. POSITIVE muscle aches, body aches, headaches  Objective  Blood pressure 110/84, pulse 74, height 5\' 4"  (1.626 m), weight 180 lb (81.6 kg), SpO2 98 %.   General: No apparent distress alert and oriented x3 mood and affect normal, dressed appropriately.  HEENT: Pupils equal, extraocular movements intact  Respiratory: Patient's speak in full sentences and does not appear short of breath  Cardiovascular: No lower extremity edema, non tender, no erythema  Skin: Warm dry intact with no signs of infection or rash on extremities or on axial skeleton.  Abdomen: Soft nontender  Neuro: Cranial nerves II through XII are intact, neurovascularly intact in all extremities with 2+ DTRs and 2+ pulses.  Lymph: No lymphadenopathy of posterior or anterior cervical chain or  axillae bilaterally.  Gait normal with good balance and coordination.  MSK:  tender with full range of motion and good stability and symmetric strength and tone of shoulders, elbows, wrist, hip, knee and ankles bilaterally.  Neck exam does have some loss of lordosis, increase in kyphosis of the upper thoracic spine.  Patient does have some mild scapular dyskinesis is noted bilaterally.  Negative Spurling's.  Does have tenderness in the musculature of the shoulder region bilaterally more than usual.  Back exam does have some loss of lordosis and more pain over the right sacroiliac joint.  Mild positive Corky Sox.  Osteopathic findings  C6 flexed rotated and side bent left T3 extended rotated and side bent right inhaled third rib T9 extended rotated and side bent left L2 flexed rotated and side bent right Sacrum right on right    Impression and Recommendations:     This case required medical decision making of moderate complexity. The above documentation has been reviewed and is accurate and complete Lyndal Pulley, DO       Note: This dictation was prepared with Dragon dictation along with smaller phrase technology. Any transcriptional errors that result from this process are unintentional.

## 2019-05-02 NOTE — Assessment & Plan Note (Signed)
Discussed home exercise, icing regimen, which activities to doing which wants to avoid.  Patient is to increase activity slowly.  Discussed which activities to potentially even avoid.  Chronic issue that is secondary to multifactorial to some of her work environment.  Follow-up again in 4 to 8 weeks.

## 2019-05-03 ENCOUNTER — Encounter: Payer: Self-pay | Admitting: Family Medicine

## 2019-05-14 ENCOUNTER — Ambulatory Visit: Payer: BC Managed Care – PPO | Admitting: Family Medicine

## 2019-05-23 ENCOUNTER — Ambulatory Visit (INDEPENDENT_AMBULATORY_CARE_PROVIDER_SITE_OTHER): Payer: BC Managed Care – PPO | Admitting: Family Medicine

## 2019-05-23 ENCOUNTER — Other Ambulatory Visit: Payer: Self-pay

## 2019-05-23 ENCOUNTER — Encounter: Payer: Self-pay | Admitting: Family Medicine

## 2019-05-23 VITALS — BP 100/80 | HR 73 | Ht 64.0 in | Wt 180.0 lb

## 2019-05-23 DIAGNOSIS — M542 Cervicalgia: Secondary | ICD-10-CM | POA: Diagnosis not present

## 2019-05-23 DIAGNOSIS — M999 Biomechanical lesion, unspecified: Secondary | ICD-10-CM

## 2019-05-23 NOTE — Progress Notes (Signed)
[name] [address] [place] [address] Phone: [phone] Subjective:   I [name] am serving as a Neurosurgeon for Dr. Antoine Primas.  This visit occurred during the SARS-CoV-2 public health emergency.  Safety protocols were in place, including screening questions prior to the visit, additional usage of staff PPE, and extensive cleaning of exam room while observing appropriate contact time as indicated for disinfecting solutions.   I'm seeing this patient by the request  of:  [name], MD  CC: Low back pain, neck pain  [id number]  Janeva Peaster is a 54 y.o. female coming in with complaint of back pain. Patient states she feels about the same. Last seen 05/02/2019 for OMT. Patient states that continues to have headaches fairly regularly.  Continues to have neck pain.  Low back has been a little more in discomfort as well.  No radiation of pain though.     Past Medical History:  Diagnosis Date  . Asthma with allergic rhinitis   . Elevated liver enzymes   . Liver disease    Fatty liver  . Migraines   . Polycystic ovaries 1984  . Sleep apnea syndrome   . Subclinical hypothyroidism    Past Surgical History:  Procedure Laterality Date  . Laproscopy  2011  . LASER ABLATION  2012  . LITHOTRIPSY  2000   Social History   Socioeconomic History  . Marital status: Divorced    Spouse name: Not on file  . Number of children: Not on file  . Years of education: Not on file  . Highest education level: Not on file  Occupational History  . Not on file  Tobacco Use  . Smoking status: Never Smoker  . Smokeless tobacco: Never Used  Substance and Sexual Activity  . Alcohol use: No  . Drug use: No    Comment: Caffiene: None.  . Sexual activity: Not Currently    Partners: Male  Other Topics Concern  . Not on file  Social History Narrative  . Not on file   Social Determinants of Health   Financial Resource Strain:   .  Difficulty of Paying Living Expenses:   Food Insecurity:   . Worried About Programme researcher, broadcasting/film/video in the Last Year:   . Barista in the Last Year:   Transportation Needs:   . Freight forwarder (Medical):   Marland Kitchen Lack of Transportation (Non-Medical):   Physical Activity:   . Days of Exercise per Week:   . Minutes of Exercise per Session:   Stress:   . Feeling of Stress :   Social Connections:   . Frequency of Communication with Friends and Family:   . Frequency of Social Gatherings with Friends and Family:   . Attends Religious Services:   . Active Member of Clubs or Organizations:   . Attends Banker Meetings:   Marland Kitchen Marital Status:    Allergies  Allergen Reactions  . Relafen [Nabumetone] Hives, Itching and Swelling  . Sulfa Antibiotics   . Clindamycin/Lincomycin Rash    Oral ONLY   Family History  Problem Relation Age of Onset  . Alcohol abuse Father   . Alcohol abuse Brother   . Coronary artery disease Maternal Grandmother   . Transient ischemic attack Maternal Grandmother   . Coronary artery disease Maternal Grandfather   . Heart attack Maternal Grandfather   . Transient ischemic attack Paternal Grandfather     Current Outpatient Medications (Endocrine &  Metabolic):  .  levothyroxine (SYNTHROID) 25 MCG tablet, Take 25 mcg by mouth daily before breakfast. .  thyroid (ARMOUR) 60 MG tablet, Take 60 mg by mouth daily before breakfast.   Current Outpatient Medications (Respiratory):  .  albuterol (PROVENTIL HFA;VENTOLIN HFA) 108 (90 BASE) MCG/ACT inhaler, Inhale 2 puffs into the lungs every 6 (six) hours as needed. prn  .  levocetirizine (XYZAL) 5 MG tablet, Take 5 mg by mouth every evening. .  mometasone (ASMANEX) 220 MCG/INH inhaler, Inhale 1 puff into the lungs daily. Prescribed by Dr. Tempie Donning. .  montelukast (SINGULAIR) 10 MG tablet, Take 10 mg by mouth Daily. .  promethazine (PHENERGAN) 25 MG tablet, Take 1 tablet (25 mg total) by mouth every 6 (six)  hours as needed for nausea or vomiting. .  SYMBICORT 80-4.5 MCG/ACT inhaler, Inhale 2 puffs into the lungs 2 (two) times daily.  Current Outpatient Medications (Analgesics):  .  zolmitriptan (ZOMIG) 5 MG tablet, Take 5 mg by mouth as needed. prn   Current Outpatient Medications (Hematological):  Marland Kitchen  Cyanocobalamin 5000 MCG SUBL, Place under the tongue.  Current Outpatient Medications (Other):  Marland Kitchen  5-Hydroxytryptophan (5-HTP) 100 MG CAPS, Take 1 capsule by mouth 2 (two) times daily.  .  Ascorbic Acid (VITAMIN C) 1000 MG tablet, Take 1,000 mg by mouth daily. .  Biotin 800 MCG TABS, Take 1 tablet by mouth daily. Marland Kitchen  buPROPion (WELLBUTRIN XL) 150 MG 24 hr tablet, Take 1 tablet (150 mg total) by mouth daily. .  Coenzyme Q10 (CO Q-10) 200 MG CAPS, Take by mouth daily.  .  diazepam (VALIUM) 5 MG tablet, Take 1 tablet (5 mg total) by mouth every 12 (twelve) hours as needed. One tab by mouth, 2 hours before procedure. .  Diclofenac Sodium (PENNSAID) 2 % SOLN, Place 2 g onto the skin 2 (two) times daily. .  Diclofenac Sodium 2 % SOLN, Place 2 g onto the skin 2 (two) times daily. Marland Kitchen  EVENING PRIMROSE OIL PO, Take 1.3 g by mouth daily. .  hydrOXYzine (ATARAX/VISTARIL) 25 MG tablet, Take 1 tablet (25 mg total) by mouth 3 (three) times daily as needed. Marland Kitchen  L-Methylfolate (DEPLIN PO), Take 400 mcg by mouth 2 (two) times daily. Marland Kitchen  MAGNESIUM CITRATE PO*, Take by mouth. .  metaxalone (SKELAXIN) 800 MG tablet, Take 800 mg by mouth as needed. .  metoCLOPramide (REGLAN) 10 MG tablet, Take 10 mg by mouth 4 (four) times daily. .  Multiple Vitamins-Minerals (ZINC PO), Take 1 capsule by mouth 2 (two) times daily. .  Omega-3 Fatty Acids (FISH OIL) 300 MG CAPS, Take 900 mg by mouth daily.  Marland Kitchen  tiZANidine (ZANAFLEX) 4 MG tablet, Take 1 tablet (4 mg total) by mouth at bedtime. .  topiramate (TOPAMAX) 25 MG tablet, Take 75 mg by mouth at bedtime. .  vitamin A 81103 UNIT capsule, Take 25,000 Units by mouth 3 (three) times a  week.  .  Vitamin D, Ergocalciferol, (DRISDOL) 1.25 MG (50000 UT) CAPS capsule, TAKE 1 CAP (50,000) UNITS BY MOUTH EVERY 7 DAYS .  vitamin E 400 UNIT capsule, Take 400 Units by mouth every other day. * These medications belong to multiple therapeutic classes and are listed under each applicable group.   Reviewed prior external information including notes and imaging from  primary care provider As well as notes that were available from care everywhere and other healthcare systems.  Past medical history, social, surgical and family history all reviewed in electronic medical record.  No pertanent information unless stated regarding to the chief complaint.   Review of Systems:  No headache, visual changes, nausea, vomiting, diarrhea, constipation, dizziness, abdominal pain, skin rash, fevers, chills, night sweats, weight loss, swollen lymph nodes, body aches, joint swelling, chest pain, shortness of breath, mood changes. POSITIVE muscle aches  Objective  Blood pressure 100/80, pulse 73, height 5\' 4"  (1.626 m), weight 180 lb (81.6 kg), SpO2 98 %.   General: No apparent distress alert and oriented x3 mood and affect normal, dressed appropriately.  HEENT: Pupils equal, extraocular movements intact  Respiratory: Patient's speak in full sentences and does not appear short of breath  Cardiovascular: No lower extremity edema, non tender, no erythema  Skin: Warm dry intact with no signs of infection or rash on extremities or on axial skeleton.  Abdomen: Soft nontender  Neuro: Cranial nerves II through XII are intact, neurovascularly intact in all extremities with 2+ DTRs and 2+ pulses.  Lymph: No lymphadenopathy of posterior or anterior cervical chain or axillae bilaterally.  Gait normal with good balance and coordination.  MSK:  Non tender with full range of motion and good stability and symmetric strength and tone of shoulders, elbows, wrist, hip, knee and ankles bilaterally.  Back exam does have  some loss of lordosis.  Some tightness noted on the FABER test bilaterally.  Negative straight leg test. Neck exam loss of lordosis, severe tenderness to palpation in both the parascapular region bilaterally.  Osteopathic findings C2 flexed rotated and side bent right C6 flexed rotated and side bent left T3 extended rotated and side bent right inhaled third rib T8 extended rotated and side bent left L2 flexed rotated and side bent right Sacrum right on right    Impression and Recommendations:     This case required medical decision making of moderate complexity. The above documentation has been reviewed and is accurate and complete Lyndal Pulley, DO       Note: This dictation was prepared with Dragon dictation along with smaller phrase technology. Any transcriptional errors that result from this process are unintentional.

## 2019-05-23 NOTE — Assessment & Plan Note (Signed)
Chronic problem, mild exacerbation, multifactorial with patient stress also contributing.  Discussed which activities to do which wants to avoid.  Discussed still any ergonomic changes that can be beneficial.  Discussed medication management including the topical anti-inflammatories as well as the baclofen.  Follow-up again in 4 to 6 weeks

## 2019-05-23 NOTE — Patient Instructions (Signed)
[email or web address] Try the exercises See me again in 5-6 weeks

## 2019-05-23 NOTE — Assessment & Plan Note (Signed)
Decision today to treat with OMT was based on Physical Exam  After verbal consent patient was treated with HVLA, ME, FPR techniques in cervical, thoracic, rib,  lumbar and sacral areas  Patient tolerated the procedure well with improvement in symptoms  Patient given exercises, stretches and lifestyle modifications  See medications in patient instructions if given  Patient will follow up in 4-8 weeks 

## 2019-06-26 NOTE — Progress Notes (Signed)
Julia [name] [address] [place] [address] Phone: [phone] Subjective:   [name], am serving as a scribe for Dr. Antoine Primas. This visit occurred during the SARS-CoV-2 public health emergency.  Safety protocols were in place, including screening questions prior to the visit, additional usage of staff PPE, and extensive cleaning of exam room while observing appropriate contact time as indicated for disinfecting solutions.   I'm seeing this patient by the request  of:  [name], MD  CC: Neck pain and upper back pain follow-up  [id number]   Julia [name] is a 54 y.o. female coming in with complaint of neck and back pain. Last seen on 05/23/2019 for OMT. Patient states that her back was bothering her yesterday.  Has been doing relatively well overall.  Discussed that she should be doing more exercises but is finding it hard to do.  Knows that stress plays a role and is having trouble coping with it at the moment.  Patient is a Teacher, early years/pre and continues to have difficulty now with the [employer] vaccine and answering questions.  Has been working on HEP for knee pain.        Past Medical History:  Diagnosis Date  . Asthma with allergic rhinitis   . Elevated liver enzymes   . Liver disease    Fatty liver  . Migraines   . Polycystic ovaries 1984  . Sleep apnea syndrome   . Subclinical hypothyroidism    Past Surgical History:  Procedure Laterality Date  . Laproscopy  2011  . LASER ABLATION  2012  . LITHOTRIPSY  2000   Social History   Socioeconomic History  . Marital status: Divorced    Spouse name: Not on file  . Number of children: Not on file  . Years of education: Not on file  . Highest education level: Not on file  Occupational History  . Not on file  Tobacco Use  . Smoking status: Never Smoker  . Smokeless tobacco: Never Used  Substance and Sexual Activity  . Alcohol use: No  . Drug use: No   Comment: Caffiene: None.  . Sexual activity: Not Currently    Partners: Male  Other Topics Concern  . Not on file  Social History Narrative  . Not on file   Social Determinants of Health   Financial Resource Strain:   . Difficulty of Paying Living Expenses:   Food Insecurity:   . Worried About Programme researcher, broadcasting/film/video in the Last Year:   . Barista in the Last Year:   Transportation Needs:   . Freight forwarder (Medical):   Marland Kitchen Lack of Transportation (Non-Medical):   Physical Activity:   . Days of Exercise per Week:   . Minutes of Exercise per Session:   Stress:   . Feeling of Stress :   Social Connections:   . Frequency of Communication with Friends and Family:   . Frequency of Social Gatherings with Friends and Family:   . Attends Religious Services:   . Active Member of Clubs or Organizations:   . Attends Banker Meetings:   Marland Kitchen Marital Status:    Allergies  Allergen Reactions  . Relafen [Nabumetone] Hives, Itching and Swelling  . Sulfa Antibiotics   . Clindamycin/Lincomycin Rash    Oral ONLY   Family History  Problem Relation Age of Onset  . Alcohol abuse Father   . Alcohol abuse Brother   .  Coronary artery disease Maternal Grandmother   . Transient ischemic attack Maternal Grandmother   . Coronary artery disease Maternal Grandfather   . Heart attack Maternal Grandfather   . Transient ischemic attack Paternal Grandfather     Current Outpatient Medications (Endocrine & Metabolic):  .  levothyroxine (SYNTHROID) 25 MCG tablet, Take 25 mcg by mouth daily before breakfast. .  thyroid (ARMOUR) 60 MG tablet, Take 60 mg by mouth daily before breakfast.   Current Outpatient Medications (Respiratory):  .  albuterol (PROVENTIL HFA;VENTOLIN HFA) 108 (90 BASE) MCG/ACT inhaler, Inhale 2 puffs into the lungs every 6 (six) hours as needed. prn  .  levocetirizine (XYZAL) 5 MG tablet, Take 5 mg by mouth every evening. .  mometasone (ASMANEX) 220 MCG/INH  inhaler, Inhale 1 puff into the lungs daily. Prescribed by Dr. Tempie Donning. .  montelukast (SINGULAIR) 10 MG tablet, Take 10 mg by mouth Daily. .  promethazine (PHENERGAN) 25 MG tablet, Take 1 tablet (25 mg total) by mouth every 6 (six) hours as needed for nausea or vomiting. .  SYMBICORT 80-4.5 MCG/ACT inhaler, Inhale 2 puffs into the lungs 2 (two) times daily.  Current Outpatient Medications (Analgesics):  .  zolmitriptan (ZOMIG) 5 MG tablet, Take 5 mg by mouth as needed. prn   Current Outpatient Medications (Hematological):  Marland Kitchen  Cyanocobalamin 5000 MCG SUBL, Place under the tongue.  Current Outpatient Medications (Other):  Marland Kitchen  5-Hydroxytryptophan (5-HTP) 100 MG CAPS, Take 1 capsule by mouth 2 (two) times daily.  .  Ascorbic Acid (VITAMIN C) 1000 MG tablet, Take 1,000 mg by mouth daily. .  Biotin 800 MCG TABS, Take 1 tablet by mouth daily. Marland Kitchen  buPROPion (WELLBUTRIN XL) 150 MG 24 hr tablet, Take 1 tablet (150 mg total) by mouth daily. .  Coenzyme Q10 (CO Q-10) 200 MG CAPS, Take by mouth daily.  .  diazepam (VALIUM) 5 MG tablet, Take 1 tablet (5 mg total) by mouth every 12 (twelve) hours as needed. One tab by mouth, 2 hours before procedure. .  Diclofenac Sodium (PENNSAID) 2 % SOLN, Place 2 g onto the skin 2 (two) times daily. .  Diclofenac Sodium 2 % SOLN, Place 2 g onto the skin 2 (two) times daily. Marland Kitchen  EVENING PRIMROSE OIL PO, Take 1.3 g by mouth daily. .  hydrOXYzine (ATARAX/VISTARIL) 25 MG tablet, Take 1 tablet (25 mg total) by mouth 3 (three) times daily as needed. Marland Kitchen  L-Methylfolate (DEPLIN PO), Take 400 mcg by mouth 2 (two) times daily. Marland Kitchen  MAGNESIUM CITRATE PO*, Take by mouth. .  metaxalone (SKELAXIN) 800 MG tablet, Take 800 mg by mouth as needed. .  metoCLOPramide (REGLAN) 10 MG tablet, Take 10 mg by mouth 4 (four) times daily. .  Multiple Vitamins-Minerals (ZINC PO), Take 1 capsule by mouth 2 (two) times daily. .  Omega-3 Fatty Acids (FISH OIL) 300 MG CAPS, Take 900 mg by mouth daily.  Marland Kitchen   tiZANidine (ZANAFLEX) 4 MG tablet, Take 1 tablet (4 mg total) by mouth at bedtime. .  topiramate (TOPAMAX) 25 MG tablet, Take 75 mg by mouth at bedtime. .  vitamin A 26333 UNIT capsule, Take 25,000 Units by mouth 3 (three) times a week.  .  Vitamin D, Ergocalciferol, (DRISDOL) 1.25 MG (50000 UT) CAPS capsule, TAKE 1 CAP (50,000) UNITS BY MOUTH EVERY 7 DAYS .  vitamin E 400 UNIT capsule, Take 400 Units by mouth every other day. * These medications belong to multiple therapeutic classes and are listed under each applicable group.  Reviewed prior external information including notes and imaging from  primary care provider As well as notes that were available from care everywhere and other healthcare systems.  Past medical history, social, surgical and family history all reviewed in electronic medical record.  No pertanent information unless stated regarding to the chief complaint.   Review of Systems:  No headache, visual changes, nausea, vomiting, diarrhea, constipation, dizziness, abdominal pain, skin rash, fevers, chills, night sweats, weight loss, swollen lymph nodes, body aches, joint swelling, chest pain, shortness of breath, mood changes. POSITIVE muscle aches  Objective  Blood pressure 118/88, pulse 78, height 5\' 4"  (1.626 m), weight 181 lb (82.1 kg), SpO2 98 %.   General: No apparent distress alert and oriented x3 mood and affect normal, dressed appropriately.  HEENT: Pupils equal, extraocular movements intact  Respiratory: Patient's speak in full sentences and does not appear short of breath  Cardiovascular: No lower extremity edema, non tender, no erythema  Neuro: Cranial nerves II through XII are intact, neurovascularly intact in all extremities with 2+ DTRs and 2+ pulses.  Gait normal with good balance and coordination.  MSK:  Non tender with full range of motion and good stability and symmetric strength and tone of shoulders, elbows, wrist, hip, knee and ankles bilaterally.    Neck exam does have some loss of lordosis.  Tender to palpation in the paraspinal musculature noted more in the parascapular region.  Osteopathic findings  C2 flexed rotated and side bent right C6 flexed rotated and side bent left T3 extended rotated and side bent right inhaled third rib T7 extended rotated and side bent left L2 flexed rotated and side bent right Sacrum right on right    Impression and Recommendations:     This case required medical decision making of moderate complexity. The above documentation has been reviewed and is accurate and complete Lyndal Pulley, DO       Note: This dictation was prepared with Dragon dictation along with smaller phrase technology. Any transcriptional errors that result from this process are unintentional.

## 2019-06-27 ENCOUNTER — Ambulatory Visit (INDEPENDENT_AMBULATORY_CARE_PROVIDER_SITE_OTHER): Payer: BC Managed Care – PPO | Admitting: Family Medicine

## 2019-06-27 ENCOUNTER — Encounter: Payer: Self-pay | Admitting: Family Medicine

## 2019-06-27 ENCOUNTER — Other Ambulatory Visit: Payer: Self-pay

## 2019-06-27 VITALS — BP 118/88 | HR 78 | Ht 64.0 in | Wt 181.0 lb

## 2019-06-27 DIAGNOSIS — M542 Cervicalgia: Secondary | ICD-10-CM | POA: Diagnosis not present

## 2019-06-27 DIAGNOSIS — M999 Biomechanical lesion, unspecified: Secondary | ICD-10-CM | POA: Diagnosis not present

## 2019-06-27 NOTE — Assessment & Plan Note (Signed)
Patient has a chronic problem with mild exacerbation.  Stress seems to be playing a role.  Discussed icing regimen and home exercise, which activities to do which wants to avoid.  Increase activity slowly over the course the next several weeks.  Follow-up again in 4 to 8 weeks.

## 2019-06-27 NOTE — Patient Instructions (Signed)
See me in 6 weeks

## 2019-06-27 NOTE — Assessment & Plan Note (Signed)

## 2019-08-08 ENCOUNTER — Encounter: Payer: Self-pay | Admitting: Family Medicine

## 2019-08-08 ENCOUNTER — Ambulatory Visit (INDEPENDENT_AMBULATORY_CARE_PROVIDER_SITE_OTHER): Payer: BC Managed Care – PPO | Admitting: Family Medicine

## 2019-08-08 ENCOUNTER — Other Ambulatory Visit: Payer: Self-pay

## 2019-08-08 VITALS — BP 120/70 | HR 74 | Ht 64.0 in | Wt 183.0 lb

## 2019-08-08 DIAGNOSIS — M542 Cervicalgia: Secondary | ICD-10-CM

## 2019-08-08 DIAGNOSIS — F419 Anxiety disorder, unspecified: Secondary | ICD-10-CM

## 2019-08-08 DIAGNOSIS — M999 Biomechanical lesion, unspecified: Secondary | ICD-10-CM

## 2019-08-08 DIAGNOSIS — G8929 Other chronic pain: Secondary | ICD-10-CM

## 2019-08-08 DIAGNOSIS — M545 Low back pain: Secondary | ICD-10-CM

## 2019-08-08 MED ORDER — PANTOPRAZOLE SODIUM 40 MG PO TBEC: 40.0000 mg | DELAYED_RELEASE_TABLET | Freq: Every day | ORAL | 0 refills | Status: DC

## 2019-08-08 MED ORDER — HYDROXYZINE HCL 25 MG PO TABS: 25.0000 mg | ORAL_TABLET | Freq: Three times a day (TID) | ORAL | 1 refills | Status: DC | PRN

## 2019-08-08 NOTE — Patient Instructions (Signed)
Good to see you Protonix 40 mg daily Lets see if that helps sleep See me again in 6 weeks

## 2019-08-08 NOTE — Progress Notes (Signed)
[facility] [address] [place] Phone: [phone] Subjective:   I Kandace Blitz am serving as a Education administrator for Dr. Hulan Saas.  This visit occurred during the SARS-CoV-2 public health emergency.  Safety protocols were in place, including screening questions prior to the visit, additional usage of staff PPE, and extensive cleaning of exam room while observing appropriate contact time as indicated for disinfecting solutions.   I'm seeing this patient by the request  of:  [name], MD  CC: Low back pain and neck pain follow-up  [id number]  Quinley Nesler is a 54 y.o. female coming in with complaint of back and neck pain. Last seen on 06/27/2019 for OMT. Patient states her neck is stiff and she has a headache.  Patient has had more of a headache recently.  Feels like also she is having increasing reflux.  Some mild increase in stress at home as well as at work.  Feels like this is contributing to some of the more aches and pains as well.  Having difficulty sleeping secondary to the reflux.  Medications patient has been prescribed: Hydroxyzine  Taking: Taking it more regularly.         Reviewed prior external information including notes and imaging from previsou exam, outside providers and external EMR if available.   As well as notes that were available from care everywhere and other healthcare systems.  Past medical history, social, surgical and family history all reviewed in electronic medical record.  No pertanent information unless stated regarding to the chief complaint.   Past Medical History:  Diagnosis Date  . Asthma with allergic rhinitis   . Elevated liver enzymes   . Liver disease    Fatty liver  . Migraines   . Polycystic ovaries 1984  . Sleep apnea syndrome   . Subclinical hypothyroidism     Allergies  Allergen Reactions  . Relafen [Nabumetone] Hives, Itching and Swelling  . Sulfa Antibiotics   .  Clindamycin/Lincomycin Rash    Oral ONLY     Review of Systems:  No  visual changes, nausea, vomiting, diarrhea, constipation, dizziness, , skin rash, fevers, chills, night sweats, weight loss, swollen lymph nodes, , joint swelling, chest pain, shortness of breath, mood changes. POSITIVE muscle aches, body aches, mild abdominal pain, headache  Objective  Blood pressure 120/70, pulse 74, height 5\' 4"  (1.626 m), weight 183 lb (83 kg), SpO2 97 %.   General: No apparent distress alert and oriented x3 mood and affect normal, dressed appropriately.  HEENT: Pupils equal, extraocular movements intact  Respiratory: Patient's speak in full sentences and does not appear short of breath  Cardiovascular: No lower extremity edema, non tender, no erythema  Neuro: Cranial nerves II through XII are intact, neurovascularly intact in all extremities with 2+ DTRs and 2+ pulses.  Gait normal with good balance and coordination.  MSK:  Non tender with full range of motion and good stability and symmetric strength and tone of shoulders, elbows, wrist, hip, knee and ankles bilaterally.  Back -patient is having increasing tightness noted at the thoracolumbar junction.  Tender to palpation in this area.  Limited range of motion in flexion and extension.  No spinous process tenderness noted.  Neck exam has increased tightness noted of the cervical spine.  Mild crepitus noted.  Mild loss of lordosis as well.  Tightness in the parascapular region also noted.  Osteopathic findings  C2 flexed rotated and side bent right T3 extended rotated  and side bent right inhaled rib T9 extended rotated and side bent left L2 flexed rotated and side bent right Sacrum right on right       Assessment and Plan:  Degenerative disc cervical-patient has had neck pain bilaterally for some time.  Worsening symptoms and exacerbation.  Worsening I think reflux as well.  Patient does have some anxiety at baseline.  Refilled patient's  hydroxyzine.  Patient has Valium is more of a muscle relaxer and we discussed other ones that could be beneficial.  Failed Protonix in case this is causing some MRI of the discomfort and pain as well.  Responds well to manipulation follow-up again 4 to 6 weeks  Nonallopathic problems  Decision today to treat with OMT was based on Physical Exam  After verbal consent patient was treated with HVLA, ME, FPR techniques in cervical, rib, thoracic, lumbar, and sacral  areas  Patient tolerated the procedure well with improvement in symptoms  Patient given exercises, stretches and lifestyle modifications  See medications in patient instructions if given  Patient will follow up in 4-6 weeks      The above documentation has been reviewed and is accurate and complete Judi Saa, DO       Note: This dictation was prepared with Dragon dictation along with smaller phrase technology. Any transcriptional errors that result from this process are unintentional.

## 2019-09-09 ENCOUNTER — Other Ambulatory Visit: Payer: Self-pay | Admitting: Family Medicine

## 2019-09-26 ENCOUNTER — Ambulatory Visit: Payer: BC Managed Care – PPO | Admitting: Family Medicine

## 2019-10-03 ENCOUNTER — Encounter: Payer: Self-pay | Admitting: Family Medicine

## 2019-10-03 ENCOUNTER — Other Ambulatory Visit: Payer: Self-pay

## 2019-10-03 ENCOUNTER — Ambulatory Visit (INDEPENDENT_AMBULATORY_CARE_PROVIDER_SITE_OTHER): Payer: BC Managed Care – PPO | Admitting: Family Medicine

## 2019-10-03 VITALS — BP 100/80 | HR 90 | Ht 64.0 in | Wt 183.0 lb

## 2019-10-03 DIAGNOSIS — M999 Biomechanical lesion, unspecified: Secondary | ICD-10-CM

## 2019-10-03 DIAGNOSIS — M542 Cervicalgia: Secondary | ICD-10-CM | POA: Diagnosis not present

## 2019-10-03 NOTE — Patient Instructions (Signed)
3 times a day for 3 days if no better 6 Get back in routine See me again in 6 weeks

## 2019-10-03 NOTE — Assessment & Plan Note (Signed)
Degenerative disc disease has been noted previously.  We discussed that this is a chronic problem with mild exacerbation with patient driving more regularly.  Patient did take Zanaflex last night and we discussed taking a more regular basis as well.  Short course of anti-inflammatories given the patient has had trouble with reflux disease.  Increase activity as tolerated.

## 2019-10-03 NOTE — Progress Notes (Signed)
[name] [address] [place] [address] Phone: [phone] Subjective:   I Julia [name] am serving as a Neurosurgeon for Dr. Antoine Primas.  This visit occurred during the SARS-CoV-2 public health emergency.  Safety protocols were in place, including screening questions prior to the visit, additional usage of staff PPE, and extensive cleaning of exam room while observing appropriate contact time as indicated for disinfecting solutions.   I'm seeing this patient by the request  of:  [name], MD  CC: Back and neck pain follow-up  [id number]  Hollace Michelli is a 54 y.o. female coming in with complaint of back and neck pain. OMT 08/08/2019. Patient states she is doing ok. Plantar fascia is acting up. Believes that was caused by a recent trip to [place].   Medications patient has been prescribed: Zanaflex  Taking:Yes        Reviewed prior external information including notes and imaging from previsou exam, outside providers and external EMR if available.   As well as notes that were available from care everywhere and other healthcare systems.  Past medical history, social, surgical and family history all reviewed in electronic medical record.  No pertanent information unless stated regarding to the chief complaint.   Past Medical History:  Diagnosis Date  . Asthma with allergic rhinitis   . Elevated liver enzymes   . Liver disease    Fatty liver  . Migraines   . Polycystic ovaries 1984  . Sleep apnea syndrome   . Subclinical hypothyroidism     Allergies  Allergen Reactions  . Relafen [Nabumetone] Hives, Itching and Swelling  . Sulfa Antibiotics   . Clindamycin/Lincomycin Rash    Oral ONLY     Review of Systems:  No  visual changes, nausea, vomiting, diarrhea, constipation, dizziness, abdominal pain, skin rash, fevers, chills, night sweats, weight loss, swollen lymph nodes, body aches, joint swelling, chest pain, shortness  of breath, mood changes. POSITIVE muscle aches, headache  Objective  Blood pressure 100/80, pulse 90, height 5\' 4"  (1.626 m), weight 183 lb (83 kg), SpO2 97 %.   General: No apparent distress alert and oriented x3 mood and affect normal, dressed appropriately.  HEENT: Pupils equal, extraocular movements intact  Respiratory: Patient's speak in full sentences and does not appear short of breath  Cardiovascular: No lower extremity edema, non tender, no erythema  Neuro: Cranial nerves II through XII are intact, neurovascularly intact in all extremities with 2+ DTRs and 2+ pulses.  Gait normal with good balance and coordination.  MSK:  Non tender with full range of motion and good stability and symmetric strength and tone of shoulders, elbows, wrist, hip, knee and ankles bilaterally.  Neck exam does have 1000.  Patient does have tenderness to palpation diffusely.  Mild decrease in sidebending bilaterally.  Negative Spurling's.  5-5 strength of the upper extremities.  Tightness in the parascapular region right greater than left.  Low back exam shows the patient does have tenderness also in the parous spinal musculature mostly around the sacrum right greater than left.  Osteopathic findings  C2 flexed rotated and side bent right C6 flexed rotated and side bent left T3 extended rotated and side bent right inhaled rib T9 extended rotated and side bent left L2 flexed rotated and side bent right Sacrum right on right       Assessment and Plan:   Neck pain, bilateral Degenerative disc disease has been noted previously.  We discussed that this is  a chronic problem with mild exacerbation with patient driving more regularly.  Patient did take Zanaflex last night and we discussed taking a more regular basis as well.  Short course of anti-inflammatories given the patient has had trouble with reflux disease.  Increase activity as tolerated. Considered a chronic problem with  exacerbation.  Nonallopathic problems  Decision today to treat with OMT was based on Physical Exam  After verbal consent patient was treated with HVLA, ME, FPR techniques in cervical, rib, thoracic, lumbar, and sacral  areas  Patient tolerated the procedure well with improvement in symptoms  Patient given exercises, stretches and lifestyle modifications  See medications in patient instructions if given  Patient will follow up in 4-8 weeks      The above documentation has been reviewed and is accurate and complete Judi Saa, DO       Note: This dictation was prepared with Dragon dictation along with smaller phrase technology. Any transcriptional errors that result from this process are unintentional.

## 2019-11-14 ENCOUNTER — Encounter: Payer: Self-pay | Admitting: Family Medicine

## 2019-11-14 ENCOUNTER — Other Ambulatory Visit: Payer: Self-pay

## 2019-11-14 ENCOUNTER — Ambulatory Visit (INDEPENDENT_AMBULATORY_CARE_PROVIDER_SITE_OTHER): Payer: BC Managed Care – PPO | Admitting: Family Medicine

## 2019-11-14 VITALS — BP 122/80 | HR 70 | Ht 64.0 in | Wt 182.0 lb

## 2019-11-14 DIAGNOSIS — M545 Low back pain: Secondary | ICD-10-CM | POA: Diagnosis not present

## 2019-11-14 DIAGNOSIS — G8929 Other chronic pain: Secondary | ICD-10-CM | POA: Diagnosis not present

## 2019-11-14 DIAGNOSIS — M999 Biomechanical lesion, unspecified: Secondary | ICD-10-CM | POA: Diagnosis not present

## 2019-11-14 DIAGNOSIS — M2140 Flat foot [pes planus] (acquired), unspecified foot: Secondary | ICD-10-CM

## 2019-11-14 NOTE — Assessment & Plan Note (Signed)
Chronic problem but seems to be stable.  Continue with the core strengthening exercises, patient is going to we will continue to work on ergonomics at work but I think can make a big difference.  Follow-up again 6 to 8 weeks

## 2019-11-14 NOTE — Assessment & Plan Note (Signed)
Discussed the over-the-counter orthotics, which she is to wear more appropriately, has responded well previously when she does get new shoes.  We discussed avoiding certain activities stretch after walking, follow-up again in 4 to 8 weeks

## 2019-11-14 NOTE — Patient Instructions (Signed)
Good to see you Plantar fascia exercises Newton or Lelon Frohlich would be good walking shoes See me again in 6-8 weeks

## 2019-11-14 NOTE — Progress Notes (Signed)
[name] [address] [place] [address] Phone: [phone] Subjective:   I [name] am serving as a Neurosurgeon for Dr. Antoine Primas.  This visit occurred during the SARS-CoV-2 public health emergency.  Safety protocols were in place, including screening questions prior to the visit, additional usage of staff PPE, and extensive cleaning of exam room while observing appropriate contact time as indicated for disinfecting solutions.   I'm seeing this patient by the request  of:  [name], MD  CC: Neck, back pain and foot pain follow-up  [id number]  Julia [name] is a 54 y.o. female coming in with complaint of back and neck pain. OMT 10/03/2019. Patient states she is doing alright.  Has been walking more frequently, and has noticed more increasing in the plantar fasciitis and foot pain.  Medications patient has been prescribed:  Pennsaid, Protonix, Vit D Taking: Yes         Reviewed prior external information including notes and imaging from previsou exam, outside providers and external EMR if available.   As well as notes that were available from care everywhere and other healthcare systems.  Past medical history, social, surgical and family history all reviewed in electronic medical record.  No pertanent information unless stated regarding to the chief complaint.   Past Medical History:  Diagnosis Date  . Asthma with allergic rhinitis   . Elevated liver enzymes   . Liver disease    Fatty liver  . Migraines   . Polycystic ovaries 1984  . Sleep apnea syndrome   . Subclinical hypothyroidism     Allergies  Allergen Reactions  . Relafen [Nabumetone] Hives, Itching and Swelling  . Sulfa Antibiotics   . Clindamycin/Lincomycin Rash    Oral ONLY     Review of Systems:  No headache, visual changes, nausea, vomiting, diarrhea, constipation, dizziness, abdominal pain, skin rash, fevers, chills, night sweats, weight  loss, swollen lymph nodes, body aches, joint swelling, chest pain, shortness of breath, mood changes. POSITIVE muscle aches  Objective  There were no vitals taken for this visit.   General: No apparent distress alert and oriented x3 mood and affect normal, dressed appropriately.  HEENT: Pupils equal, extraocular movements intact  Respiratory: Patient's speak in full sentences and does not appear short of breath  Cardiovascular: No lower extremity edema, non tender, no erythema  Neuro: Cranial nerves II through XII are intact, Gait normal with good balance and coordination.  MSK:  Non tender with full range of motion and good stability and symmetric strength and tone of shoulders, elbows, wrist, hip, knee and ankles bilaterally.  Back -no back exam does show the patient does have some tenderness to palpation in the paraspinal musculature lumbar spine left greater than right.  Patient does have some mild tightness of the left sacroiliac that is different than usual Osteopathic findings  C6 flexed rotated and side bent left T3 extended rotated and side bent right inhaled rib T9 extended rotated and side bent left L2 flexed rotated and side bent right Sacrum left on left       Assessment and Plan:   Pes planus Discussed the over-the-counter orthotics, which she is to wear more appropriately, has responded well previously when she does get new shoes.  We discussed avoiding certain activities stretch after walking, follow-up again in 4 to 8 weeks  Low back pain Chronic problem but seems to be stable.  Continue with the core strengthening exercises, patient is going to  we will continue to work on ergonomics at work but I think can make a big difference.  Follow-up again 6 to 8 weeks   Nonallopathic problems  Decision today to treat with OMT was based on Physical Exam  After verbal consent patient was treated with HVLA, ME, FPR techniques in cervical, rib, thoracic, lumbar, and sacral   areas  Patient tolerated the procedure well with improvement in symptoms  Patient given exercises, stretches and lifestyle modifications  See medications in patient instructions if given  Patient will follow up in 6-8 weeks      The above documentation has been reviewed and is accurate and complete [name]       Note: This dictation was prepared with Dragon dictation along with smaller phrase technology. Any transcriptional errors that result from this process are unintentional.

## 2020-01-02 ENCOUNTER — Other Ambulatory Visit: Payer: Self-pay

## 2020-01-02 ENCOUNTER — Ambulatory Visit (INDEPENDENT_AMBULATORY_CARE_PROVIDER_SITE_OTHER): Payer: BC Managed Care – PPO | Admitting: Family Medicine

## 2020-01-02 ENCOUNTER — Encounter: Payer: Self-pay | Admitting: Family Medicine

## 2020-01-02 VITALS — BP 104/82 | HR 96 | Ht 64.0 in | Wt 184.0 lb

## 2020-01-02 DIAGNOSIS — M545 Low back pain, unspecified: Secondary | ICD-10-CM | POA: Diagnosis not present

## 2020-01-02 DIAGNOSIS — M542 Cervicalgia: Secondary | ICD-10-CM

## 2020-01-02 DIAGNOSIS — M999 Biomechanical lesion, unspecified: Secondary | ICD-10-CM

## 2020-01-02 DIAGNOSIS — G8929 Other chronic pain: Secondary | ICD-10-CM | POA: Diagnosis not present

## 2020-01-02 NOTE — Patient Instructions (Signed)
Good to see you Muscle relaxer for the next 5 nights Watch posture and ergonomics See me again in 4-5 weeks

## 2020-01-02 NOTE — Assessment & Plan Note (Signed)
Patient is sitting more at work and is noticing more tightness of the lower back.  Patient also found to have more tightness of the upper neck pain today.  Responded well to manipulation.  We discussed different medications and encourage her to take the muscle relaxer on a more regular basis at night.  Discussed which activities to doing which wants to avoid and try to keep hands within peripheral vision follow-up again 6 weeks

## 2020-01-02 NOTE — Progress Notes (Signed)
[name] [address] [place] [address] Phone: [phone] Subjective:   [name], am serving as a scribe for Dr. Antoine Primas. This visit occurred during the SARS-CoV-2 public health emergency.  Safety protocols were in place, including screening questions prior to the visit, additional usage of staff PPE, and extensive cleaning of exam room while observing appropriate contact time as indicated for disinfecting solutions.   I'm seeing this patient by the request  of:  [name], MD  CC: Neck pain and upper back pain follow-up  [id number]  Julia [name] is a 54 y.o. female coming in with complaint of back and neck pain. OMT 11/14/2019. Patient states patient continues to have discomfort and pain.  Patient has been sitting over the bit more than usual.  Feels like that has contributed to some of the aches and pains.  Noticing mild tightness in the lower back as well.  Medications patient has been prescribed: Vit D, Zanaflex  Taking: Yes occasionally         Reviewed prior external information including notes and imaging from previsou exam, outside providers and external EMR if available.   As well as notes that were available from care everywhere and other healthcare systems.  Past medical history, social, surgical and family history all reviewed in electronic medical record.  No pertanent information unless stated regarding to the chief complaint.   Past Medical History:  Diagnosis Date  . Asthma with allergic rhinitis   . Elevated liver enzymes   . Liver disease    Fatty liver  . Migraines   . Polycystic ovaries 1984  . Sleep apnea syndrome   . Subclinical hypothyroidism     Allergies  Allergen Reactions  . Relafen [Nabumetone] Hives, Itching and Swelling  . Sulfa Antibiotics   . Clindamycin/Lincomycin Rash    Oral ONLY     Review of Systems:  No headache, visual changes, nausea, vomiting, diarrhea,  constipation, dizziness, abdominal pain, skin rash, fevers, chills, night sweats, weight loss, swollen lymph nodes, body aches, joint swelling, chest pain, shortness of breath, mood changes. POSITIVE muscle aches  Objective  Blood pressure 104/82, pulse 96, height 5\' 4"  (1.626 m), weight 184 lb (83.5 kg), SpO2 96 %.   General: No apparent distress alert and oriented x3 mood and affect normal, dressed appropriately.  HEENT: Pupils equal, extraocular movements intact  Respiratory: Patient's speak in full sentences and does not appear short of breath  Cardiovascular: No lower extremity edema, non tender, no erythema  Neuro: Cranial nerves II through XII are intact, neurovascularly intact in all extremities with 2+ DTRs and 2+ pulses.  Gait normal with good balance and coordination.  MSK:  Non tender with full range of motion and good stability and symmetric strength and tone of shoulders, elbows, wrist, hip, knee and ankles bilaterally.  Neck exam shows some mild loss of lordosis.  Some mild limited to sidebending bilaterally.  Patient does have very mild increase in kyphosis of the upper thoracic spine.  Tightness noted in the paraspinal and parascapular region in the thoracic spine.  Osteopathic findings  C2 flexed rotated and side bent right C6 flexed rotated and side bent left T3 extended rotated and side bent right inhaled rib T9 extended rotated and side bent left L2 flexed rotated and side bent right Sacrum right on right       Assessment and Plan:  Low back pain Patient is sitting more at work and is noticing  more tightness of the lower back.  Patient also found to have more tightness of the upper neck pain today.  Responded well to manipulation.  We discussed different medications and encourage her to take the muscle relaxer on a more regular basis at night.  Discussed which activities to doing which wants to avoid and try to keep hands within peripheral vision follow-up again 6  weeks    Nonallopathic problems  Decision today to treat with OMT was based on Physical Exam  After verbal consent patient was treated with HVLA, ME, FPR techniques in cervical, rib, thoracic, lumbar, and sacral  areas  Patient tolerated the procedure well with improvement in symptoms  Patient given exercises, stretches and lifestyle modifications  See medications in patient instructions if given  Patient will follow up in 6 weeks      The above documentation has been reviewed and is accurate and complete Judi Saa, DO       Note: This dictation was prepared with Dragon dictation along with smaller phrase technology. Any transcriptional errors that result from this process are unintentional.

## 2020-02-06 ENCOUNTER — Encounter: Payer: Self-pay | Admitting: Family Medicine

## 2020-02-06 ENCOUNTER — Other Ambulatory Visit: Payer: Self-pay

## 2020-02-06 ENCOUNTER — Ambulatory Visit (INDEPENDENT_AMBULATORY_CARE_PROVIDER_SITE_OTHER): Payer: BC Managed Care – PPO | Admitting: Family Medicine

## 2020-02-06 VITALS — BP 124/80 | HR 66 | Ht 64.0 in | Wt 183.0 lb

## 2020-02-06 DIAGNOSIS — M542 Cervicalgia: Secondary | ICD-10-CM | POA: Diagnosis not present

## 2020-02-06 DIAGNOSIS — M999 Biomechanical lesion, unspecified: Secondary | ICD-10-CM | POA: Diagnosis not present

## 2020-02-06 NOTE — Assessment & Plan Note (Signed)
Continues to have neck pain.  Has had this for greater than 7 years at this time.  Patient knows does not allow her to stop her.  Continues to be able to be active.  Discussed icing regimen and home exercises, discussed avoiding certain activities.  Discussed ergonomics throughout the day.  Does respond well to manipulation and follow-up again in 6 weeks.  Chronic problem with mild exacerbation and has Xanax for breakthrough pain.

## 2020-02-06 NOTE — Patient Instructions (Addendum)
Good to see you  [place] service for christmas would be great! See me again in 6-8 weeks Happy Holliday's

## 2020-02-06 NOTE — Progress Notes (Signed)
[name] [address] [place] [address] Phone: [phone] Subjective:   Julia [name], am serving as a scribe for Dr. Antoine Primas. This visit occurred during the SARS-CoV-2 public health emergency.  Safety protocols were in place, including screening questions prior to the visit, additional usage of staff PPE, and extensive cleaning of exam room while observing appropriate contact time as indicated for disinfecting solutions.   I'm seeing this patient by the request  of:  [name], MD  CC: Low back pain follow-up and neck pain follow-up  [id number]  Julia [name] is a 54 y.o. female coming in with complaint of back and neck pain. OMT 01/02/2020 Patient states she would be doing great but has been "playing in the leaves" and is having some neck pain and stiffness.  Worsening neck pain recently.  Medications patient has been prescribed: None this year does have Zanaflex  Taking: Very sparingly         Reviewed prior external information including notes and imaging from previsou exam, outside providers and external EMR if available.   As well as notes that were available from care everywhere and other healthcare systems.  Past medical history, social, surgical and family history all reviewed in electronic medical record.  No pertanent information unless stated regarding to the chief complaint.   Past Medical History:  Diagnosis Date   Asthma with allergic rhinitis    Elevated liver enzymes    Liver disease    Fatty liver   Migraines    Polycystic ovaries 1984   Sleep apnea syndrome    Subclinical hypothyroidism     Allergies  Allergen Reactions   Relafen [Nabumetone] Hives, Itching and Swelling   Sulfa Antibiotics    Clindamycin/Lincomycin Rash    Oral ONLY     Review of Systems:  No headache, visual changes, nausea, vomiting, diarrhea, constipation, dizziness, abdominal pain, skin rash,  fevers, chills, night sweats, weight loss, swollen lymph nodes, body aches, joint swelling, chest pain, shortness of breath, mood changes. POSITIVE muscle aches  Objective  Blood pressure 124/80, pulse 66, height 5\' 4"  (1.626 m), weight 183 lb (83 kg), SpO2 97 %.   General: No apparent distress alert and oriented x3 mood and affect normal, dressed appropriately.  HEENT: Pupils equal, extraocular movements intact  Respiratory: Patient's speak in full sentences and does not appear short of breath  Cardiovascular: No lower extremity edema, non tender, no erythema  Neuro: Cranial nerves II through XII are intact, neurovascularly intact in all extremities with 2+ DTRs and 2+ pulses.  Gait normal with good balance and coordination.  MSK:  Non tender with full range of motion and good stability and symmetric strength and tone of shoulders, elbows, wrist, hip, knee and ankles bilaterally.  Back -neck exam shows the patient does have some mild loss of lordosis, tightness in the right trapezius area.  Negative Spurling's.  Patient does have some limited left-sided rotation and right-sided sidebending.  Osteopathic findings  C2 flexed rotated and side bent right T3 extended rotated and side bent right inhaled rib T8 extended rotated and side bent left L4 flexed rotated and side bent left Sacrum right on right       Assessment and Plan:  Neck pain, bilateral Continues to have neck pain.  Has had this for greater than 7 years at this time.  Patient knows does not allow her to stop her.  Continues to be able to be active.  Discussed  icing regimen and home exercises, discussed avoiding certain activities.  Discussed ergonomics throughout the day.  Does respond well to manipulation and follow-up again in 6 weeks.  Chronic problem with mild exacerbation and has Xanax for breakthrough pain.    Nonallopathic problems  Decision today to treat with OMT was based on Physical Exam  After verbal consent  patient was treated with HVLA, ME, FPR techniques in cervical, rib, thoracic, lumbar, and sacral  areas  Patient tolerated the procedure well with improvement in symptoms  Patient given exercises, stretches and lifestyle modifications  See medications in patient instructions if given  Patient will follow up in 4-8 weeks      The above documentation has been reviewed and is accurate and complete Judi Saa, DO       Note: This dictation was prepared with Dragon dictation along with smaller phrase technology. Any transcriptional errors that result from this process are unintentional.

## 2020-03-16 NOTE — Progress Notes (Signed)
[name] [address] [place] [address] Phone: [phone] Subjective:   Julia [name], am serving as a scribe for Dr. Antoine Primas. This visit occurred during the SARS-CoV-2 public health emergency.  Safety protocols were in place, including screening questions prior to the visit, additional usage of staff PPE, and extensive cleaning of exam room while observing appropriate contact time as indicated for disinfecting solutions.   I'm seeing this patient by the request  of:  Julia Beech, MD  CC: Back and neck pain follow-up  [id number]  Torrance Frech is a 55 y.o. female coming in with complaint of back and neck pain. OMT 02/06/2020. Patient states that she feels like her hand is going numb more often.  Patient states that if she changes position it seems to go away.  Patient states that it is more secondary to tightness she feels than anything truly in the neck at this time.  Patient feels like it is more of her chronic problem.           Reviewed prior external information including notes and imaging from previsou exam, outside providers and external EMR if available.   As well as notes that were available from care everywhere and other healthcare systems.  Past medical history, social, surgical and family history all reviewed in electronic medical record.  No pertanent information unless stated regarding to the chief complaint.   Past Medical History:  Diagnosis Date  . Asthma with allergic rhinitis   . Elevated liver enzymes   . Liver disease    Fatty liver  . Migraines   . Polycystic ovaries 1984  . Sleep apnea syndrome   . Subclinical hypothyroidism     Allergies  Allergen Reactions  . Relafen [Nabumetone] Hives, Itching and Swelling  . Sulfa Antibiotics   . Clindamycin/Lincomycin Rash    Oral ONLY     Review of Systems:  No headache, visual changes, nausea, vomiting, diarrhea, constipation, dizziness,  abdominal pain, skin rash, fevers, chills, night sweats, weight loss, swollen lymph nodes, body aches, joint swelling, chest pain, shortness of breath, mood changes. POSITIVE muscle aches  Objective  Blood pressure 116/84, pulse 69, height 5\' 4"  (1.626 m), weight 184 lb (83.5 kg), SpO2 99 %.   General: No apparent distress alert and oriented x3 mood and affect normal, dressed appropriately.  HEENT: Pupils equal, extraocular movements intact  Respiratory: Patient's speak in full sentences and does not appear short of breath  Cardiovascular: No lower extremity edema, non tender, no erythema  Back -patient is similar in pain in the upper back.  Seems to be on the right greater than left.  Seems to be more in the trapezius and the parascapular region. Neck exam does have some mild loss of lordosis.  Tenderness to palpation of the paraspinal musculature right greater than left.  5 out of 5 strength of the upper extremities.  Very mild impingement of the right shoulder noted.  Osteopathic findings  C5 flexed rotated and side bent right T3 extended rotated and side bent right inhaled rib T8 extended rotated and side bent left L1 flexed rotated and side bent right Sacrum right on right       Assessment and Plan:  Neck pain, bilateral Neck pain seems to be more secondary to the posterolateral aspect of the upper scapular region.  Do not believe the radicular symptoms need further work-up at this time with none found on exam today.  We have discussed  using the muscle relaxer on a more regular basis.  Discussed home exercises.  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  Follow-up with me again 6 to 8 weeks    Nonallopathic problems  Decision today to treat with OMT was based on Physical Exam  After verbal consent patient was treated with HVLA, ME, FPR techniques in cervical, rib, thoracic, lumbar, and sacral  areas  Patient tolerated the procedure well with improvement in  symptoms  Patient given exercises, stretches and lifestyle modifications  See medications in patient instructions if given  Patient will follow up in 6-8 weeks      The above documentation has been reviewed and is accurate and complete Judi Saa, DO       Note: This dictation was prepared with Dragon dictation along with smaller phrase technology. Any transcriptional errors that result from this process are unintentional.

## 2020-03-17 ENCOUNTER — Other Ambulatory Visit: Payer: Self-pay

## 2020-03-17 ENCOUNTER — Ambulatory Visit (INDEPENDENT_AMBULATORY_CARE_PROVIDER_SITE_OTHER): Payer: BC Managed Care – PPO | Admitting: Family Medicine

## 2020-03-17 ENCOUNTER — Encounter: Payer: Self-pay | Admitting: Family Medicine

## 2020-03-17 VITALS — BP 116/84 | HR 69 | Ht 64.0 in | Wt 184.0 lb

## 2020-03-17 DIAGNOSIS — M542 Cervicalgia: Secondary | ICD-10-CM | POA: Diagnosis not present

## 2020-03-17 DIAGNOSIS — M999 Biomechanical lesion, unspecified: Secondary | ICD-10-CM

## 2020-03-17 NOTE — Assessment & Plan Note (Signed)
Neck pain seems to be more secondary to the posterolateral aspect of the upper scapular region.  Do not believe the radicular symptoms need further work-up at this time with none found on exam today.  We have discussed using the muscle relaxer on a more regular basis.  Discussed home exercises.  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  Follow-up with me again 6 to 8 weeks

## 2020-03-17 NOTE — Patient Instructions (Signed)
Good to see you Try mm relaxer for 3 nights See me again 5-6 weeks

## 2020-03-24 ENCOUNTER — Other Ambulatory Visit: Payer: BC Managed Care – PPO

## 2020-04-23 ENCOUNTER — Encounter: Payer: Self-pay | Admitting: Family Medicine

## 2020-04-23 ENCOUNTER — Ambulatory Visit (INDEPENDENT_AMBULATORY_CARE_PROVIDER_SITE_OTHER): Payer: BC Managed Care – PPO | Admitting: Family Medicine

## 2020-04-23 ENCOUNTER — Other Ambulatory Visit: Payer: Self-pay

## 2020-04-23 VITALS — BP 106/76 | HR 66 | Ht 64.0 in | Wt 184.0 lb

## 2020-04-23 DIAGNOSIS — G4730 Sleep apnea, unspecified: Secondary | ICD-10-CM | POA: Diagnosis not present

## 2020-04-23 DIAGNOSIS — M999 Biomechanical lesion, unspecified: Secondary | ICD-10-CM

## 2020-04-23 DIAGNOSIS — M542 Cervicalgia: Secondary | ICD-10-CM

## 2020-04-23 DIAGNOSIS — R0683 Snoring: Secondary | ICD-10-CM | POA: Diagnosis not present

## 2020-04-23 NOTE — Assessment & Plan Note (Signed)
Neck pain worsening.  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  Does have a headache physician.  Has seen neurology and does notice that she has been snoring more at night that could be exacerbating as well and will refer her back for a sleep study.  Been greater than 5 years and has not been using her CPAP.  Patient does respond well though to osteopathic manipulation.  Chronic problem with mild exacerbation.  Discussed patient's Zanaflex.  Follow-up again in 6 weeks

## 2020-04-23 NOTE — Progress Notes (Signed)
[name] [address] [place] [address] Phone: [phone] Subjective:   Julia [name], am serving as a scribe for Dr. Antoine Primas. This visit occurred during the SARS-CoV-2 public health emergency.  Safety protocols were in place, including screening questions prior to the visit, additional usage of staff PPE, and extensive cleaning of exam room while observing appropriate contact time as indicated for disinfecting solutions.   I'm seeing this patient by the request  of:  [name], MD  CC: Neck pain and headache follow-up  [id number]  Julia [name] is a 55 y.o. female coming in with complaint of back and neck pain. OMT 03/17/2020. Patient states that her pain remains relatively the same since last visit.  Tightness noted for the neck.  Is having a migraine.  Patient has had this from time to time.  Feels the weather has been more than the problems.          Reviewed prior external information including notes and imaging from previsou exam, outside providers and external EMR if available.   As well as notes that were available from care everywhere and other healthcare systems.  Past medical history, social, surgical and family history all reviewed in electronic medical record.  No pertanent information unless stated regarding to the chief complaint.   Past Medical History:  Diagnosis Date  . Asthma with allergic rhinitis   . Elevated liver enzymes   . Liver disease    Fatty liver  . Migraines   . Polycystic ovaries 1984  . Sleep apnea syndrome   . Subclinical hypothyroidism     Allergies  Allergen Reactions  . Relafen [Nabumetone] Hives, Itching and Swelling  . Sulfa Antibiotics   . Clindamycin/Lincomycin Rash    Oral ONLY     Review of Systems:  No visual changes, nausea, vomiting, diarrhea, constipation, dizziness, abdominal pain, skin rash, fevers, chills, night sweats, weight loss, swollen lymph nodes,,  joint swelling, chest pain, shortness of breath, mood changes. POSITIVE muscle aches, body aches, headache  Objective  Blood pressure 106/76, pulse 66, height 5\' 4"  (1.626 m), weight 184 lb (83.5 kg), SpO2 99 %.   General: No apparent distress alert and oriented x3 mood and affect normal, dressed appropriately.  HEENT: Pupils equal, extraocular movements intact  Respiratory: Patient's speak in full sentences and does not appear short of breath  Cardiovascular: No lower extremity edema, non tender, no erythema  Gait normal with good balance and coordination.  MSK:  Non tender with full range of motion and good stability and symmetric strength and tone of shoulders, elbows, wrist, hip, knee and ankles bilaterally.  Neck exam shows some more tightness.  Tightness noted in the parascapular region as well.  Patient has increasing tightness in the thoracolumbar juncture bilaterally.  Mild positive FABER test bilaterally right greater than left.  Negative straight leg test.  Negative Spurling's on the neck.  Osteopathic findings  C2 flexed rotated and side bent right T3 extended rotated and side bent right inhaled rib L1 flexed rotated and side bent right Sacrum right on right       Assessment and Plan:  Neck pain, bilateral Neck pain worsening.  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  Does have a headache physician.  Has seen neurology and does notice that she has been snoring more at night that could be exacerbating as well and will refer her back for a sleep study.  Been greater than 5 years and  has not been using her CPAP.  Patient does respond well though to osteopathic manipulation.  Chronic problem with mild exacerbation.  Discussed patient's Zanaflex.  Follow-up again in 6 weeks    Nonallopathic problems  Decision today to treat with OMT was based on Physical Exam  After verbal consent patient was treated with HVLA, ME, FPR techniques in cervical, rib, thoracic,  lumbar, and sacral  areas  Patient tolerated the procedure well with improvement in symptoms  Patient given exercises, stretches and lifestyle modifications  See medications in patient instructions if given  Patient will follow up in 4-8 weeks      The above documentation has been reviewed and is accurate and complete Judi Saa, DO       Note: This dictation was prepared with Dragon dictation along with smaller phrase technology. Any transcriptional errors that result from this process are unintentional.

## 2020-04-23 NOTE — Patient Instructions (Signed)
Hope headache breaks soon Dr. Rhoderick Moody Sleep Study  See me again in 6 weeks

## 2020-06-03 NOTE — Progress Notes (Signed)
[name] [address] [place] [address] Phone: [phone] Subjective:   [name], am serving as a scribe for Dr. Antoine Primas. This visit occurred during the SARS-CoV-2 public health emergency.  Safety protocols were in place, including screening questions prior to the visit, additional usage of staff PPE, and extensive cleaning of exam room while observing appropriate contact time as indicated for disinfecting solutions.   I'm seeing this patient by the request  of:  [name], MD  CC: Back pain neck pain  [id number]  Julia [name] is a 55 y.o. female coming in with complaint of back and neck pain. OMT 04/23/2020. Patient states that she is having an increase in neck pain.  Having increasing in headaches as well.  Patient feels it is more secondary to the weather.  Patient denies any radiation down into the arms.  Also has switch:  Medications patient has been prescribed: None          Reviewed prior external information including notes and imaging from previsou exam, outside providers and external EMR if available.   As well as notes that were available from care everywhere and other healthcare systems.  Past medical history, social, surgical and family history all reviewed in electronic medical record.  No pertanent information unless stated regarding to the chief complaint.   Past Medical History:  Diagnosis Date  . Asthma with allergic rhinitis   . Elevated liver enzymes   . Liver disease    Fatty liver  . Migraines   . Polycystic ovaries 1984  . Sleep apnea syndrome   . Subclinical hypothyroidism     Allergies  Allergen Reactions  . Relafen [Nabumetone] Hives, Itching and Swelling  . Sulfa Antibiotics   . Clindamycin/Lincomycin Rash    Oral ONLY     Review of Systems:  No  visual changes, nausea, vomiting, diarrhea, constipation, dizziness, abdominal pain, skin rash, fevers, chills, night  sweats, weight loss, swollen lymph nodes, joint swelling, chest pain, shortness of breath, mood changes. POSITIVE muscle aches, body aches, headache  Objective  Blood pressure 106/82, pulse 69, height 5\' 4"  (1.626 m), weight 189 lb (85.7 kg), SpO2 98 %.   General: No apparent distress alert and oriented x3 mood and affect normal, dressed appropriately.  HEENT: Pupils equal, extraocular movements intact  Respiratory: Patient's speak in full sentences and does not appear short of breath  Cardiovascular: No lower extremity edema, non tender, no erythema  Neuro: Cranial nerves II through XII are intact, neurovascularly intact in all extremities with 2+ DTRs and 2+ pulses.  Gait normal with good balance and coordination.  MSK:  Non tender with full range of motion and good stability and symmetric strength and tone of shoulders, elbows, wrist, hip, knee and ankles bilaterally.  Tightness of the neck noted.  Patient does have some limited side bending of the neck bilaterally.  Lacks last 5 degrees of extension.  Negative Spurling's.  5-5 strength of the upper extremities.  Increase significant tightness of the upper thoracic spine left greater than right.  Osteopathic findings  C2 flexed rotated and side bent right C4 flexed rotated and side bent right C6 flexed rotated and side bent left T3 extended rotated and side bent left inhaled rib T9 extended rotated and side bent left L2 flexed rotated and side bent right Sacrum right on right       Assessment and Plan:  Neck pain, bilateral Increase in neck pain.  Described  the pain is more of a dull, throbbing aching pain.  Known to have some mild arthritic changes.  Patient does respond usually well to osteopathic manipulation Patient does not want to make any significant medication changes.  Does have the Zanaflex for any breakthrough.  Follow-up with me 5 to 6 weeks.    Nonallopathic problems  Decision today to treat with OMT was based on  Physical Exam  After verbal consent patient was treated with HVLA, ME, FPR techniques in cervical, rib, thoracic, lumbar, and sacral  areas  Patient tolerated the procedure well with improvement in symptoms  Patient given exercises, stretches and lifestyle modifications  See medications in patient instructions if given  Patient will follow up in 4-8 weeks      The above documentation has been reviewed and is accurate and complete Judi Saa, DO       Note: This dictation was prepared with Dragon dictation along with smaller phrase technology. Any transcriptional errors that result from this process are unintentional.

## 2020-06-04 ENCOUNTER — Other Ambulatory Visit: Payer: Self-pay

## 2020-06-04 ENCOUNTER — Ambulatory Visit (INDEPENDENT_AMBULATORY_CARE_PROVIDER_SITE_OTHER): Payer: BC Managed Care – PPO | Admitting: Family Medicine

## 2020-06-04 ENCOUNTER — Encounter: Payer: Self-pay | Admitting: Family Medicine

## 2020-06-04 VITALS — BP 106/82 | HR 69 | Ht 64.0 in | Wt 189.0 lb

## 2020-06-04 DIAGNOSIS — M542 Cervicalgia: Secondary | ICD-10-CM | POA: Diagnosis not present

## 2020-06-04 DIAGNOSIS — M999 Biomechanical lesion, unspecified: Secondary | ICD-10-CM | POA: Diagnosis not present

## 2020-06-04 NOTE — Assessment & Plan Note (Signed)
Increase in neck pain.  Described the pain is more of a dull, throbbing aching pain.  Known to have some mild arthritic changes.  Patient does respond usually well to osteopathic manipulation Patient does not want to make any significant medication changes.  Does have the Zanaflex for any breakthrough.  Follow-up with me 5 to 6 weeks.

## 2020-06-04 NOTE — Patient Instructions (Signed)
Good to see you Let's watch allergy season See me again in 6 weeks

## 2020-07-23 ENCOUNTER — Ambulatory Visit: Payer: BC Managed Care – PPO | Admitting: Family Medicine

## 2020-07-26 IMAGING — DX DG ANKLE COMPLETE 3+V*L*
3 series · 3 of 3 positions shown · non-contrast
Comparison: None.

CLINICAL DATA: Chronic left ankle pain

EXAM:
LEFT ANKLE COMPLETE - 3+ VIEW

[ankle ap]
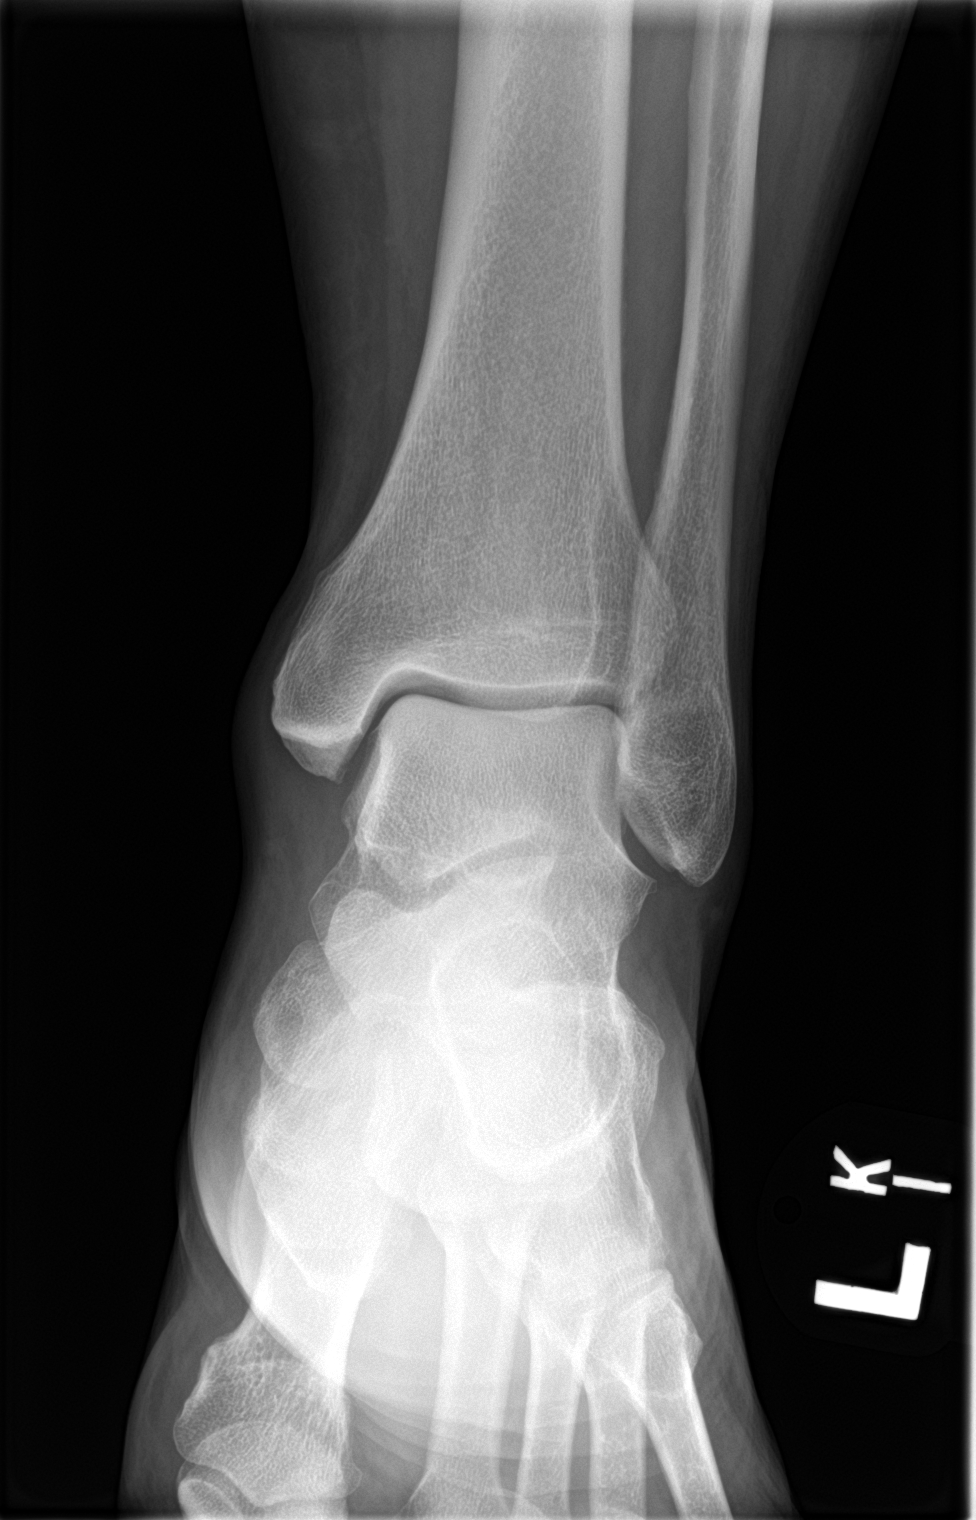

[ankle obl]
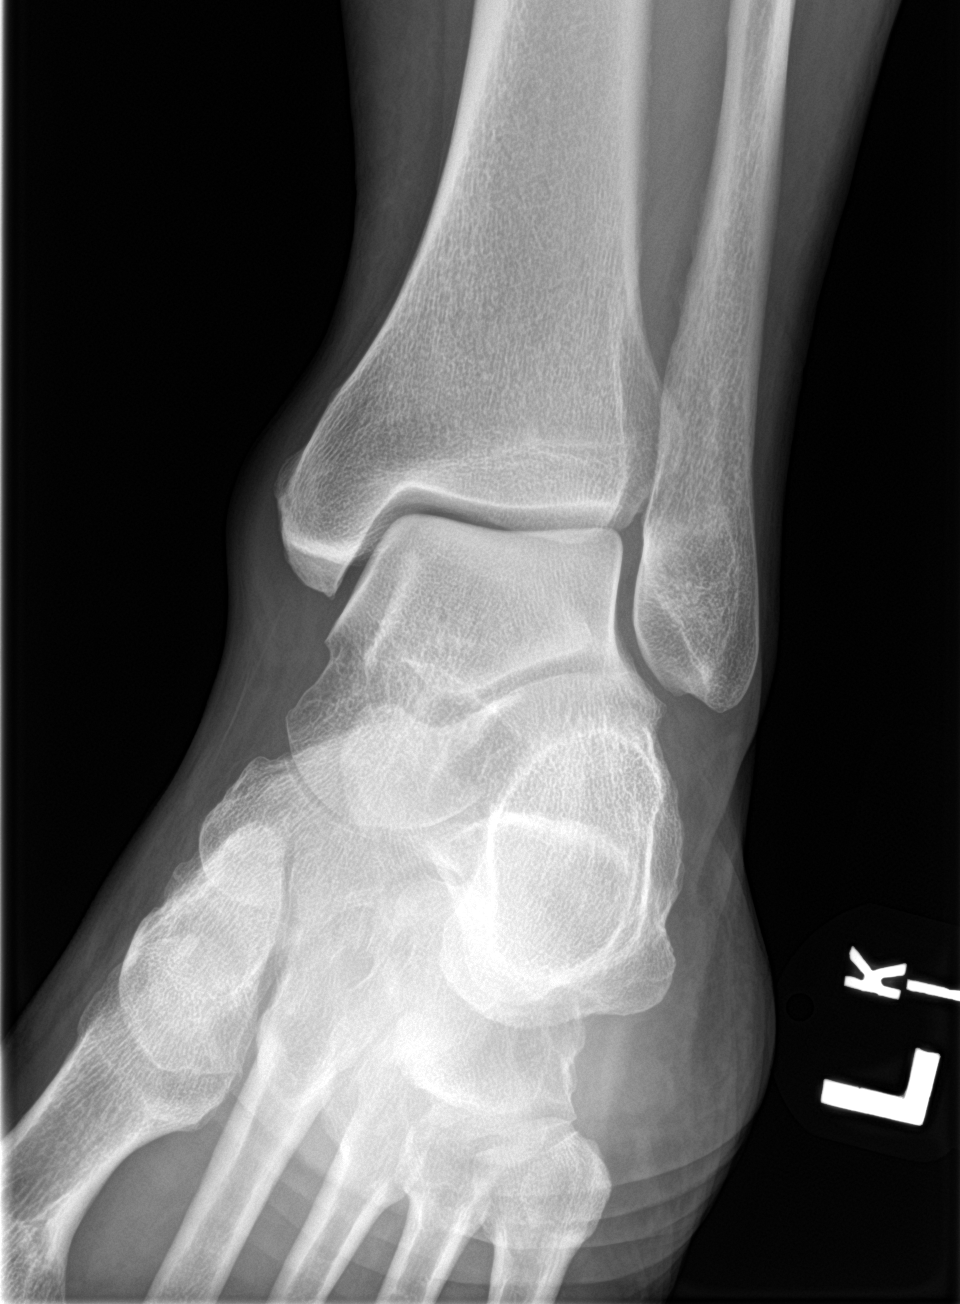

[ankle lat]
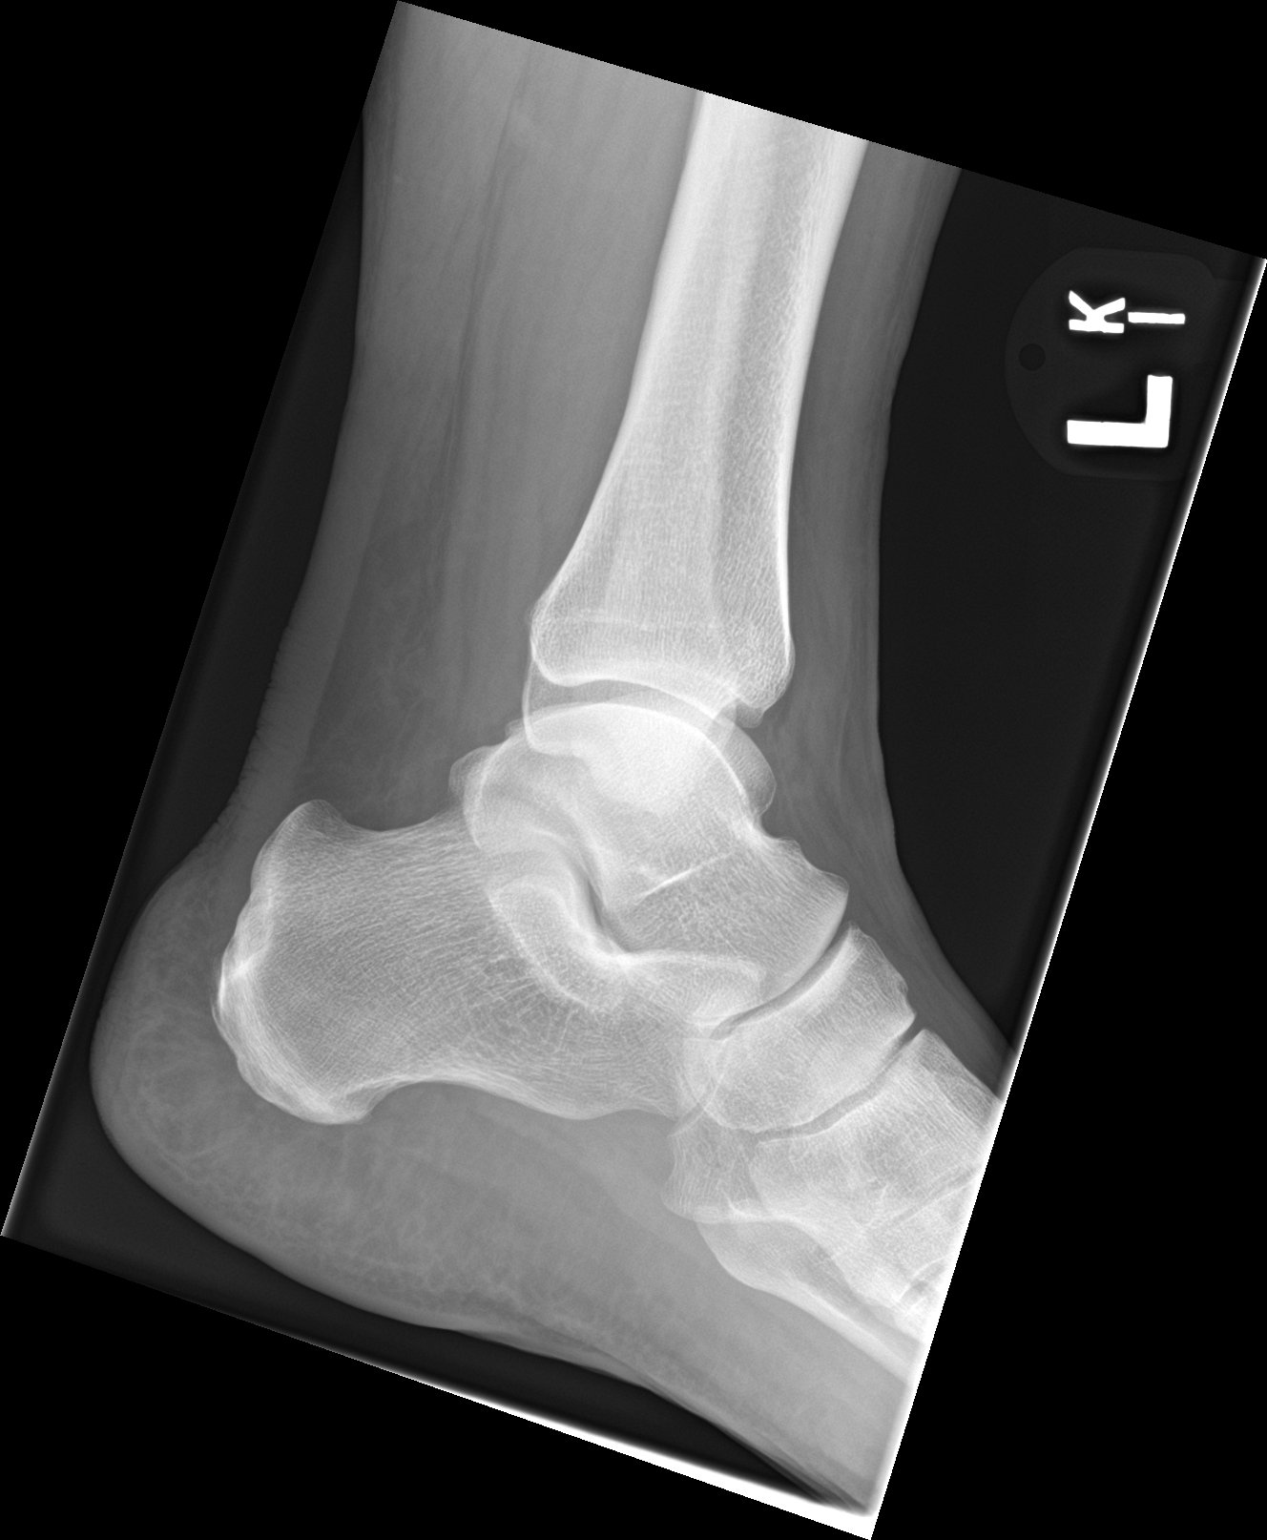

[3 of 3 positions shown; findings below may reference images not displayed]

FINDINGS: There is no evidence of fracture, dislocation, or joint effusion.
There is no evidence of arthropathy or other focal bone abnormality.
Soft tissues are unremarkable.
IMPRESSION: Negative.

## 2020-07-31 NOTE — Progress Notes (Signed)
[name] [address] [place] [address] Phone: [phone] Subjective:   [name], am serving as a scribe for Dr. Antoine Primas. This visit occurred during the SARS-CoV-2 public health emergency.  Safety protocols were in place, including screening questions prior to the visit, additional usage of staff PPE, and extensive cleaning of exam room while observing appropriate contact time as indicated for disinfecting solutions.   I'm seeing this patient by the request  of:  [name], MD  CC: Neck and back pain follow-up  [id number]  Julia [name] is a 55 y.o. female coming in with complaint of back and neck pain. OMT 06/04/2020. Patient states that she did have increase in neck pain after a fall running from bees. Pain has improved.  Still has some tightness.  Knee feels like it is more her baseline no at this moment.  L ankle pain started back. Got out of bed and felt a pop. Pain over medial aspect but today pain is over lateral aspect.  Patient states that she has had this pain previously.  Feels like what she has had on the contralateral side.  Swelled initially but no longer having swelling.  Still a mild aching sensation.  Medications patient has been prescribed: None          Past Medical History:  Diagnosis Date  . Asthma with allergic rhinitis   . Elevated liver enzymes   . Liver disease    Fatty liver  . Migraines   . Polycystic ovaries 1984  . Sleep apnea syndrome   . Subclinical hypothyroidism     Allergies  Allergen Reactions  . Relafen [Nabumetone] Hives, Itching and Swelling  . Sulfa Antibiotics   . Clindamycin/Lincomycin Rash    Oral ONLY     Review of Systems:  No  visual changes, nausea, vomiting, diarrhea, constipation, dizziness, abdominal pain, skin rash, fevers, chills, night sweats, weight loss, swollen lymph nodes, body aches, joint swelling, chest pain, shortness of breath, mood  changes. POSITIVE muscle aches, headache  Objective  Blood pressure 108/70, pulse 77, height 5\' 4"  (1.626 m), weight 188 lb (85.3 kg), SpO2 98 %.   General: No apparent distress alert and oriented x3 mood and affect normal, dressed appropriately.  HEENT: Pupils equal, extraocular movements intact  Respiratory: Patient's speak in full sentences and does not appear short of breath  Cardiovascular: No lower extremity edema, non tender, no erythema  Gait normal with good balance and coordination.  MSK: Ankle exam bilaterally shows the patient does have mild abnormality noted of the medial aspect of the talar dome bilaterally.  Patient does have pes planus with overpronation of the hindfoot noted. Back -increasing neck tightness noted.  Patient does have some limited loss of sidebending bilaterally.  Lacks last 10 degrees of extension.  Osteopathic findings  C4 flexed rotated and side bent right C6 flexed rotated and side bent left T3 extended rotated and side bent right inhaled rib L2 flexed rotated and side bent right Sacrum right on right       Assessment and Plan:  Neck pain, bilateral Continues to have tightness.  Patient will has done very well for nearly 8 years at this point with manipulation.  We discussed posture and ergonomics, continuing to stay active.  Good range of motion.  No change in medications.  Follow-up with me again in 6 to 8 weeks  Pes planus Has had difficulty with the ankles previously with the pes planus  of the feet as well.  Encouraged her to continue to wear the shoes and when patient is on uneven ground to wear her ankle brace on a more regular basis.  No significant findings otherwise.  Can always get advanced imaging if necessary but do not think it is unlikely.  No significant swelling on exam today.  Follow-up again in 6    Nonallopathic problems  Decision today to treat with OMT was based on Physical Exam  After verbal consent patient was treated  with HVLA, ME, FPR techniques in cervical, rib, thoracic, lumbar, and sacral  areas  Patient tolerated the procedure well with improvement in symptoms  Patient given exercises, stretches and lifestyle modifications  See medications in patient instructions if given  Patient will follow up in 4-8 weeks      The above documentation has been reviewed and is accurate and complete Judi Saa, DO       Note: This dictation was prepared with Dragon dictation along with smaller phrase technology. Any transcriptional errors that result from this process are unintentional.

## 2020-08-04 ENCOUNTER — Encounter: Payer: Self-pay | Admitting: Family Medicine

## 2020-08-04 ENCOUNTER — Ambulatory Visit (INDEPENDENT_AMBULATORY_CARE_PROVIDER_SITE_OTHER): Payer: BC Managed Care – PPO | Admitting: Family Medicine

## 2020-08-04 ENCOUNTER — Other Ambulatory Visit: Payer: Self-pay

## 2020-08-04 VITALS — BP 108/70 | HR 77 | Ht 64.0 in | Wt 188.0 lb

## 2020-08-04 DIAGNOSIS — M9908 Segmental and somatic dysfunction of rib cage: Secondary | ICD-10-CM

## 2020-08-04 DIAGNOSIS — M9904 Segmental and somatic dysfunction of sacral region: Secondary | ICD-10-CM

## 2020-08-04 DIAGNOSIS — M9901 Segmental and somatic dysfunction of cervical region: Secondary | ICD-10-CM | POA: Diagnosis not present

## 2020-08-04 DIAGNOSIS — M9902 Segmental and somatic dysfunction of thoracic region: Secondary | ICD-10-CM

## 2020-08-04 DIAGNOSIS — M9903 Segmental and somatic dysfunction of lumbar region: Secondary | ICD-10-CM

## 2020-08-04 DIAGNOSIS — M542 Cervicalgia: Secondary | ICD-10-CM | POA: Diagnosis not present

## 2020-08-04 DIAGNOSIS — M2140 Flat foot [pes planus] (acquired), unspecified foot: Secondary | ICD-10-CM

## 2020-08-04 NOTE — Patient Instructions (Signed)
Watch out for flying insects Wear ankle brace with yardwork for 2 weeks If anything changes, give [place] a call See me again in 6 weeks otherwise

## 2020-08-04 NOTE — Assessment & Plan Note (Signed)
Continues to have tightness.  Patient will has done very well for nearly 8 years at this point with manipulation.  We discussed posture and ergonomics, continuing to stay active.  Good range of motion.  No change in medications.  Follow-up with me again in 6 to 8 weeks

## 2020-08-04 NOTE — Assessment & Plan Note (Signed)
Has had difficulty with the ankles previously with the pes planus of the feet as well.  Encouraged her to continue to wear the shoes and when patient is on uneven ground to wear her ankle brace on a more regular basis.  No significant findings otherwise.  Can always get advanced imaging if necessary but do not think it is unlikely.  No significant swelling on exam today.  Follow-up again in 6

## 2020-09-20 NOTE — Progress Notes (Signed)
[name] [address] [place] [address] Phone: [phone] Subjective:   I Julia [name] am serving as a Neurosurgeon for Dr. Antoine Primas.  This visit occurred during the SARS-CoV-2 public health emergency.  Safety protocols were in place, including screening questions prior to the visit, additional usage of staff PPE, and extensive cleaning of exam room while observing appropriate contact time as indicated for disinfecting solutions.   I'm seeing this patient by the request  of:  [name], MD  CC: Back and neck pain.  Follow-up  [id number]  Julia [name] is a 55 y.o. female coming in with complaint of back and neck pain. OMT 07/25/2020. Patient states she injured her back using a wedge pillow. Pain has decreased since then.   Medications patient has been prescribed: None          Reviewed prior external information including notes and imaging from previsou exam, outside providers and external EMR if available.   As well as notes that were available from care everywhere and other healthcare systems.  Past medical history, social, surgical and family history all reviewed in electronic medical record.  No pertanent information unless stated regarding to the chief complaint.   Past Medical History:  Diagnosis Date   Asthma with allergic rhinitis    Elevated liver enzymes    Liver disease    Fatty liver   Migraines    Polycystic ovaries 1984   Sleep apnea syndrome    Subclinical hypothyroidism     Allergies  Allergen Reactions   Relafen [Nabumetone] Hives, Itching and Swelling   Sulfa Antibiotics    Clindamycin/Lincomycin Rash    Oral ONLY     Review of Systems:  No , visual changes, nausea, vomiting, diarrhea, constipation, dizziness, abdominal pain, skin rash, fevers, chills, night sweats, weight loss, swollen lymph nodes,  joint swelling, chest pain, shortness of breath, mood changes. POSITIVE muscle aches, body  aches, headache, Rash  Objective  Blood pressure 130/82, pulse 79, height 5\' 4"  (1.626 m), weight 187 lb (84.8 kg), SpO2 96 %.   General: No apparent distress alert and oriented x3 mood and affect normal, dressed appropriately.  HEENT: Pupils equal, extraocular movements intact  Respiratory: Patient's speak in full sentences and does not appear short of breath  Cardiovascular: No lower extremity edema, non tender, no erythema  Low back exam does have significant lordosis.  Poor core strength noted.  Patient does have tightness in the paraspinal musculature of the lumbar spine almost diffusely. Tightness noted with FABER test bilaterally. Patient does have petechiae noted of the lower extremity mostly of the thigh area.  Osteopathic findings  C6 flexed rotated and side bent left T3 extended rotated and side bent right inhaled rib L2 flexed rotated and side bent right L5 flexed rotated and side bent right Sacrum right on right       Assessment and Plan:  Low back pain Chronic problem with exacerbation.  Patient did have some mild increase in tightness noted in the lower back and upper back.  Responded well to manipulation.  Discussed different medications.  He does have the topical anti-inflammatory as needed.  Follow-up with me again 6 to 8 weeks.  Petechiae Patient does have what appears to be more petechiae of the lower extremities.  Patient states that it has been intermittent.  He does have labs showed a positive ANA.  Patient is being treated by her primary care provider.  We discussed the possibility  of a referral to rheumatology if she would like.  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  Follow-up again 6 to 8 weeks.   Nonallopathic problems  Decision today to treat with OMT was based on Physical Exam  After verbal consent patient was treated with HVLA, ME, FPR techniques in cervical, rib, thoracic, lumbar, and sacral  areas  Patient  tolerated the procedure well with improvement in symptoms  Patient given exercises, stretches and lifestyle modifications  See medications in patient instructions if given  Patient will follow up in 4-8 weeks      The above documentation has been reviewed and is accurate and complete Judi Saa, DO        Note: This dictation was prepared with Dragon dictation along with smaller phrase technology. Any transcriptional errors that result from this process are unintentional.

## 2020-09-22 ENCOUNTER — Ambulatory Visit: Payer: BC Managed Care – PPO | Admitting: Family Medicine

## 2020-09-22 ENCOUNTER — Ambulatory Visit (INDEPENDENT_AMBULATORY_CARE_PROVIDER_SITE_OTHER): Payer: BC Managed Care – PPO | Admitting: Family Medicine

## 2020-09-22 ENCOUNTER — Encounter: Payer: Self-pay | Admitting: Family Medicine

## 2020-09-22 ENCOUNTER — Other Ambulatory Visit: Payer: Self-pay

## 2020-09-22 VITALS — BP 130/82 | HR 79 | Ht 64.0 in | Wt 187.0 lb

## 2020-09-22 DIAGNOSIS — M9903 Segmental and somatic dysfunction of lumbar region: Secondary | ICD-10-CM | POA: Diagnosis not present

## 2020-09-22 DIAGNOSIS — M545 Low back pain, unspecified: Secondary | ICD-10-CM

## 2020-09-22 DIAGNOSIS — M9901 Segmental and somatic dysfunction of cervical region: Secondary | ICD-10-CM | POA: Diagnosis not present

## 2020-09-22 DIAGNOSIS — R233 Spontaneous ecchymoses: Secondary | ICD-10-CM

## 2020-09-22 DIAGNOSIS — G8929 Other chronic pain: Secondary | ICD-10-CM

## 2020-09-22 DIAGNOSIS — M9904 Segmental and somatic dysfunction of sacral region: Secondary | ICD-10-CM | POA: Diagnosis not present

## 2020-09-22 DIAGNOSIS — M9908 Segmental and somatic dysfunction of rib cage: Secondary | ICD-10-CM

## 2020-09-22 DIAGNOSIS — M9902 Segmental and somatic dysfunction of thoracic region: Secondary | ICD-10-CM | POA: Diagnosis not present

## 2020-09-22 NOTE — Assessment & Plan Note (Signed)
Chronic problem with exacerbation.  Patient did have some mild increase in tightness noted in the lower back and upper back.  Responded well to manipulation.  Discussed different medications.  He does have the topical anti-inflammatory as needed.  Follow-up with me again 6 to 8 weeks.

## 2020-09-22 NOTE — Assessment & Plan Note (Signed)
Patient does have what appears to be more petechiae of the lower extremities.  Patient states that it has been intermittent.  He does have labs showed a positive ANA.  Patient is being treated by her primary care provider.  We discussed the possibility of a referral to rheumatology if she would like.  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  Follow-up again 6 to 8 weeks.

## 2020-09-22 NOTE — Patient Instructions (Signed)
Good to see you I will do some reading and see what I can find out Write [place] if you want to consider referral to rheumatology  Stay off the tumeric for now See me again in 5-6 weeks

## 2020-09-29 ENCOUNTER — Ambulatory Visit: Payer: BC Managed Care – PPO | Admitting: Family Medicine

## 2020-10-27 ENCOUNTER — Ambulatory Visit: Payer: BC Managed Care – PPO | Admitting: Family Medicine

## 2020-11-05 NOTE — Progress Notes (Signed)
[name] [address] [place] [address] Phone: [phone] Subjective:   Julia [name], am serving as a scribe for Dr. Antoine Primas. This visit occurred during the SARS-CoV-2 public health emergency.  Safety protocols were in place, including screening questions prior to the visit, additional usage of staff PPE, and extensive cleaning of exam room while observing appropriate contact time as indicated for disinfecting solutions.   I'm seeing this patient by the request  of:  Julia Beech, MD  CC: Back and neck pain  [id number]  Julia [name] is a 55 y.o. female coming in with complaint of back and neck pain. OMT 09/22/2020. Patient states she did have some shoulder pain that comes and goes, but its getting better.  Patient feels her daily activities still trying but she can push through  Medications patient has been prescribed: None  Taking:          Past Medical History:  Diagnosis Date   Asthma with allergic rhinitis    Elevated liver enzymes    Liver disease    Fatty liver   Migraines    Polycystic ovaries 1984   Sleep apnea syndrome    Subclinical hypothyroidism     Allergies  Allergen Reactions   Relafen [Nabumetone] Hives, Itching and Swelling   Sulfa Antibiotics    Clindamycin/Lincomycin Rash    Oral ONLY     Review of Systems:  No headache, visual changes, nausea, vomiting, diarrhea, constipation, dizziness, abdominal pain, skin rash, fevers, chills, night sweats, weight loss, swollen lymph nodes, body aches, joint swelling, chest pain, shortness of breath, mood changes. POSITIVE muscle aches  Objective  Height 5\' 4"  (1.626 m).   General: No apparent distress alert and oriented x3 mood and affect normal, dressed appropriately.  HEENT: Pupils equal, extraocular movements intact  Respiratory: Patient's speak in full sentences and does not appear short of breath  Cardiovascular: No lower extremity  edema, non tender, no erythema  Neck exam does have some loss of lordosis.  Patient does have some limited ROM bilaterally.  Patient is tender to palpation negative straight leg test.  Patient does have mild limited range of motion in all planes of the lower back.  Osteopathic findings  C2 flexed rotated and side bent right C7 flexed rotated and side bent left T3 extended rotated and side bent right inhaled rib T9 extended rotated and side bent left L2 flexed rotated and side bent right Sacrum right on right       Assessment and Plan:  Neck pain, bilateral Chronic, with mild exacerbation.  Mild increase in tightness recently.  Nothing no severe overall.  Discussed different medications and over-the-counter medications that could be beneficial.  Discussed with vitamin D supplementation  Follow-up again in 6 weeks   Nonallopathic problems  Decision today to treat with OMT was based on Physical Exam  After verbal consent patient was treated with HVLA, ME, FPR techniques in cervical, rib, thoracic, lumbar, and sacral  areas  Patient tolerated the procedure well with improvement in symptoms  Patient given exercises, stretches and lifestyle modifications  See medications in patient instructions if given  Patient will follow up in 4-8 weeks      The above documentation has been reviewed and is accurate and complete , DO        Note: This dictation was prepared with Dragon dictation along with smaller phrase technology. Any transcriptional errors that result from this process are unintentional.

## 2020-11-06 ENCOUNTER — Other Ambulatory Visit: Payer: Self-pay

## 2020-11-06 ENCOUNTER — Ambulatory Visit (INDEPENDENT_AMBULATORY_CARE_PROVIDER_SITE_OTHER): Payer: BC Managed Care – PPO | Admitting: Family Medicine

## 2020-11-06 VITALS — Ht 64.0 in

## 2020-11-06 DIAGNOSIS — M9903 Segmental and somatic dysfunction of lumbar region: Secondary | ICD-10-CM | POA: Diagnosis not present

## 2020-11-06 DIAGNOSIS — M9908 Segmental and somatic dysfunction of rib cage: Secondary | ICD-10-CM | POA: Diagnosis not present

## 2020-11-06 DIAGNOSIS — M542 Cervicalgia: Secondary | ICD-10-CM

## 2020-11-06 DIAGNOSIS — M9904 Segmental and somatic dysfunction of sacral region: Secondary | ICD-10-CM

## 2020-11-06 DIAGNOSIS — M9902 Segmental and somatic dysfunction of thoracic region: Secondary | ICD-10-CM

## 2020-11-06 DIAGNOSIS — M9901 Segmental and somatic dysfunction of cervical region: Secondary | ICD-10-CM | POA: Diagnosis not present

## 2020-11-06 NOTE — Assessment & Plan Note (Signed)
Chronic, with mild exacerbation.  Mild increase in tightness recently.  Nothing no severe overall.  Discussed different medications and over-the-counter medications that could be beneficial.  Discussed with vitamin D supplementation  Follow-up again in 6 weeks

## 2020-11-06 NOTE — Patient Instructions (Signed)
Great to see you No more long phone calls See me in 6 weeks

## 2020-12-16 NOTE — Progress Notes (Signed)
[name] [address] [place] [address] Phone: [phone] Subjective:   Julia [name], am serving as a scribe for Dr. Antoine Primas.  This visit occurred during the SARS-CoV-2 public health emergency.  Safety protocols were in place, including screening questions prior to the visit, additional usage of staff PPE, and extensive cleaning of exam room while observing appropriate contact time as indicated for disinfecting solutions.    I'm seeing this patient by the request  of:  [name], MD  CC: back and neck pain   [id number]  Julia [name] is a 55 y.o. female coming in with complaint of back and neck pain. OMT 11/06/2020. Patient states that she has been sanding deck which has increased her pain. Patient was seen in ED recently.  Patient did have double vision.  Patient did have a CT of the head that did show a focal area of low density in the left occipital region but otherwise unremarkable.  Patient has followed up with 2 other neurologist already and they are awaiting MRI but is having difficulty with insurance.  Patient continues to have her regular chronic headaches but no worsening.  Patient's vision is back.  Was doing a lot of activity previously.  Medications patient has been prescribed: None        Reviewed prior external information including notes and imaging from previsou exam, outside providers and external EMR if available.  This reviewed included the emergency room visit, reports of the CT scan and the CT angiogram of the neck  As well as notes that were available from care everywhere and other healthcare systems.  Past medical history, social, surgical and family history all reviewed in electronic medical record.  No pertanent information unless stated regarding to the chief complaint.   Past Medical History:  Diagnosis Date   Asthma with allergic rhinitis    Elevated liver enzymes    Liver disease     Fatty liver   Migraines    Polycystic ovaries 1984   Sleep apnea syndrome    Subclinical hypothyroidism     Allergies  Allergen Reactions   Relafen [Nabumetone] Hives, Itching and Swelling   Sulfa Antibiotics    Clindamycin/Lincomycin Rash    Oral ONLY     Review of Systems:  No  visual changes, nausea, vomiting, diarrhea, constipation, dizziness, abdominal pain, skin rash, fevers, chills, night sweats, weight loss, swollen lymph nodes, body aches, joint swelling, chest pain, shortness of breath, mood changes. POSITIVE muscle aches, headache  Objective  Blood pressure 110/84, pulse 64, height 5\' 4"  (1.626 m), weight 189 lb (85.7 kg), SpO2 99 %.   General: No apparent distress alert and oriented x3 mood and affect normal, dressed appropriately.  HEENT: Pupils equal, extraocular movements intact  Respiratory: Patient's speak in full sentences and does not appear short of breath  Cardiovascular: No lower extremity edema, non tender, no erythema  Patient's neck exam does have some mild loss of lordosis.  Patient does have increasing kyphosis.   Osteopathic findings T3 extended rotated and side bent right inhaled rib T9 extended rotated and side bent left L2 flexed rotated and side bent right Sacrum right on right       Assessment and Plan:  Low back pain Focused on patient's low back pain.  Does have the neck pain as well as the cervicogenic headaches.  Patient has had though the finding of the CT scan for the possibility for the abnormality noted in  the occipital area.  Think this is low likelihood of anything severe.  Patient is going to await the MRI.  We will hold on manipulation of the neck follow-up again in 4 to 6 weeks   Nonallopathic problems  Decision today to treat with OMT was based on Physical Exam  After verbal consent patient was treated with HVLA, ME, FPR techniques in  rib, thoracic, lumbar, and sacral  areas avoiding manipulation of the neck secondary to  patient's recent imaging even though I think that this is low likelihood.  Did have a CT angiogram of the neck that was unremarkable.  This was reviewed report by me today.  Patient tolerated the procedure well with improvement in symptoms  Patient given exercises, stretches and lifestyle modifications  See medications in patient instructions if given  Patient will follow up in 4 weeks     The above documentation has been reviewed and is accurate and complete Judi Saa, DO        Note: This dictation was prepared with Dragon dictation along with smaller phrase technology. Any transcriptional errors that result from this process are unintentional.

## 2020-12-17 ENCOUNTER — Encounter: Payer: Self-pay | Admitting: Family Medicine

## 2020-12-17 ENCOUNTER — Ambulatory Visit (INDEPENDENT_AMBULATORY_CARE_PROVIDER_SITE_OTHER): Payer: BC Managed Care – PPO | Admitting: Family Medicine

## 2020-12-17 ENCOUNTER — Other Ambulatory Visit: Payer: Self-pay

## 2020-12-17 VITALS — BP 110/84 | HR 64 | Ht 64.0 in | Wt 189.0 lb

## 2020-12-17 DIAGNOSIS — M9902 Segmental and somatic dysfunction of thoracic region: Secondary | ICD-10-CM

## 2020-12-17 DIAGNOSIS — G8929 Other chronic pain: Secondary | ICD-10-CM

## 2020-12-17 DIAGNOSIS — M545 Low back pain, unspecified: Secondary | ICD-10-CM | POA: Diagnosis not present

## 2020-12-17 DIAGNOSIS — M9903 Segmental and somatic dysfunction of lumbar region: Secondary | ICD-10-CM | POA: Diagnosis not present

## 2020-12-17 DIAGNOSIS — M9904 Segmental and somatic dysfunction of sacral region: Secondary | ICD-10-CM | POA: Diagnosis not present

## 2020-12-17 DIAGNOSIS — M9908 Segmental and somatic dysfunction of rib cage: Secondary | ICD-10-CM

## 2020-12-17 NOTE — Assessment & Plan Note (Signed)
Focused on patient's low back pain.  Does have the neck pain as well as the cervicogenic headaches.  Patient has had though the finding of the CT scan for the possibility for the abnormality noted in the occipital area.  Think this is low likelihood of anything severe.  Patient is going to await the MRI.  We will hold on manipulation of the neck follow-up again in 4 to 6 weeks

## 2020-12-17 NOTE — Patient Instructions (Signed)
See me again in 4-5 weeks with your projects Hope to get MRI results to be able to do neck adjustment

## 2020-12-24 ENCOUNTER — Other Ambulatory Visit: Payer: Self-pay | Admitting: Neurology

## 2020-12-24 DIAGNOSIS — H532 Diplopia: Secondary | ICD-10-CM

## 2021-01-03 ENCOUNTER — Other Ambulatory Visit: Payer: Self-pay

## 2021-01-03 ENCOUNTER — Ambulatory Visit
Admission: RE | Admit: 2021-01-03 | Discharge: 2021-01-03 | Disposition: A | Discharge status: Home / Self Care | Payer: BC Managed Care – PPO | Source: Ambulatory Visit | Attending: Neurology | Admitting: Neurology

## 2021-01-03 DIAGNOSIS — H532 Diplopia: Secondary | ICD-10-CM

## 2021-01-19 ENCOUNTER — Ambulatory Visit: Payer: BC Managed Care – PPO | Admitting: Family Medicine

## 2021-02-22 NOTE — Progress Notes (Signed)
[name] [address] [place] [address] Phone: [phone] Subjective:   [name], am serving as a scribe for Dr. Antoine Primas. This visit occurred during the SARS-CoV-2 public health emergency.  Safety protocols were in place, including screening questions prior to the visit, additional usage of staff PPE, and extensive cleaning of exam room while observing appropriate contact time as indicated for disinfecting solutions.   I'm seeing this patient by the request  of:  [name], MD  CC: Back and neck pain follow-up  [id number]  Julia [name] is a 55 y.o. female coming in with complaint of back and neck pain. OMT 12/17/2020. Patient states neck pain. Wants to talk left foot right ankle. Pain in both for a while but went away. Thinks it may be gout. No other complaints  Medications patient has been prescribed: None         Reviewed prior external information including notes and imaging from previsou exam, outside providers and external EMR if available.   As well as notes that were available from care everywhere and other healthcare systems.  Did review patient's MRI of the brain that only showed some very mild chronic vascular changes but otherwise unremarkable.  Past medical history, social, surgical and family history all reviewed in electronic medical record.  No pertanent information unless stated regarding to the chief complaint.   Past Medical History:  Diagnosis Date   Asthma with allergic rhinitis    Elevated liver enzymes    Liver disease    Fatty liver   Migraines    Polycystic ovaries 1984   Sleep apnea syndrome    Subclinical hypothyroidism     Allergies  Allergen Reactions   Relafen [Nabumetone] Hives, Itching and Swelling   Sulfa Antibiotics    Clindamycin/Lincomycin Rash    Oral ONLY     Review of Systems:  No headache, visual changes, nausea, vomiting, diarrhea, constipation, dizziness,  abdominal pain, skin rash, fevers, chills, night sweats, weight loss, swollen lymph nodes, body aches, joint swelling, chest pain, shortness of breath, mood changes. POSITIVE muscle aches  Objective  Blood pressure 124/86, pulse 68, height 5\' 4"  (1.626 m), weight 195 lb (88.5 kg), SpO2 98 %.   General: No apparent distress alert and oriented x3 mood and affect normal, dressed appropriately.  HEENT: Pupils equal, extraocular movements intact  Respiratory: Patient's speak in full sentences and does not appear short of breath  Cardiovascular: No lower extremity edema, non tender, no erythema  Neck exam shows patient is a patient does have some mild loss of lordosis.  Difficulty with sidebending bilaterally. Back exam shows loss of lordosis.  Significant increase in the left side of the paraspinal musculature today.  Seems to be more of the right sacroiliac joint and the left paraspinal musculature of the lumbar.  Negative straight leg test.   Osteopathic findings  C2 flexed rotated and side bent right C5 flexed rotated and side bent left T3 extended rotated and side bent right inhaled rib T9 extended rotated and side bent left L2 flexed rotated and side bent left L5 flexed rotated and side bent left Sacrum right on right       Assessment and Plan:  Neck pain, bilateral Neck exam does not show significant changes.  Some may be even increasing tightness with an exacerbation of the chronic problem.  Patient encouraged to increase activity slowly.  Discussed icing regimen and home exercises.  Follow-up again in 6 to  8 weeks.    Nonallopathic problems  Decision today to treat with OMT was based on Physical Exam  After verbal consent patient was treated with HVLA, ME, FPR techniques in cervical, rib, thoracic, lumbar, and sacral  areas  Patient tolerated the procedure well with improvement in symptoms  Patient given exercises, stretches and lifestyle modifications  See medications  in patient instructions if given  Patient will follow up in 4-8 weeks      The above documentation has been reviewed and is accurate and complete Judi Saa, DO        Note: This dictation was prepared with Dragon dictation along with smaller phrase technology. Any transcriptional errors that result from this process are unintentional.

## 2021-02-23 ENCOUNTER — Other Ambulatory Visit: Payer: Self-pay

## 2021-02-23 ENCOUNTER — Ambulatory Visit (INDEPENDENT_AMBULATORY_CARE_PROVIDER_SITE_OTHER): Payer: BC Managed Care – PPO | Admitting: Family Medicine

## 2021-02-23 VITALS — BP 124/86 | HR 68 | Ht 64.0 in | Wt 195.0 lb

## 2021-02-23 DIAGNOSIS — M542 Cervicalgia: Secondary | ICD-10-CM | POA: Diagnosis not present

## 2021-02-23 DIAGNOSIS — M9902 Segmental and somatic dysfunction of thoracic region: Secondary | ICD-10-CM | POA: Diagnosis not present

## 2021-02-23 DIAGNOSIS — M9908 Segmental and somatic dysfunction of rib cage: Secondary | ICD-10-CM

## 2021-02-23 DIAGNOSIS — M9901 Segmental and somatic dysfunction of cervical region: Secondary | ICD-10-CM

## 2021-02-23 DIAGNOSIS — M9903 Segmental and somatic dysfunction of lumbar region: Secondary | ICD-10-CM | POA: Diagnosis not present

## 2021-02-23 DIAGNOSIS — M9904 Segmental and somatic dysfunction of sacral region: Secondary | ICD-10-CM

## 2021-02-23 NOTE — Patient Instructions (Signed)
Tart Cherry 1200mg  Good shoes at work See you again in 5-6 weeks

## 2021-02-23 NOTE — Assessment & Plan Note (Signed)
Neck exam does not show significant changes.  Some may be even increasing tightness with an exacerbation of the chronic problem.  Patient encouraged to increase activity slowly.  Discussed icing regimen and home exercises.  Follow-up again in 6 to 8 weeks.

## 2021-03-25 NOTE — Progress Notes (Signed)
[name] [address] [place] [address] Phone: [phone] Subjective:   [name], am serving as a scribe for Dr. Antoine Primas. This visit occurred during the SARS-CoV-2 public health emergency.  Safety protocols were in place, including screening questions prior to the visit, additional usage of staff PPE, and extensive cleaning of exam room while observing appropriate contact time as indicated for disinfecting solutions.  I'm seeing this patient by the request  of:  [name], MD  CC: back and neck pain   [id number]  Julia [name] is a 56 y.o. female coming in with complaint of back and neck pain. OMT on 02/23/2021. Patient states that she has been doing ok. No issues since last visit. Having pain in R ankle. No pattern to when she has pain. Mornings are stiff for R ankle. Just started [facility]. Did have sleep study since last visit and apnea is worse.   Medications patient has been prescribed: None        Reviewed prior external information including notes and imaging from previsou exam, outside providers and external EMR if available.   As well as notes that were available from care everywhere and other healthcare systems.  Past medical history, social, surgical and family history all reviewed in electronic medical record.  No pertanent information unless stated regarding to the chief complaint.   Past Medical History:  Diagnosis Date   Asthma with allergic rhinitis    Elevated liver enzymes    Liver disease    Fatty liver   Migraines    Polycystic ovaries 1984   Sleep apnea syndrome    Subclinical hypothyroidism     Allergies  Allergen Reactions   Relafen [Nabumetone] Hives, Itching and Swelling   Sulfa Antibiotics    Clindamycin/Lincomycin Rash    Oral ONLY     Review of Systems:  No headache, visual changes, nausea, vomiting, diarrhea, constipation, dizziness, abdominal pain, skin rash, fevers,  chills, night sweats, weight loss, swollen lymph nodes, body aches, joint swelling, chest pain, shortness of breath, mood changes. POSITIVE muscle aches  Objective  Blood pressure 110/86, pulse 66, height 5\' 4"  (1.626 m), weight 191 lb (86.6 kg), SpO2 98 %.   General: No apparent distress alert and oriented x3 mood and affect normal, dressed appropriately.  HEENT: Pupils equal, extraocular movements intact  Respiratory: Patient's speak in full sentences and does not appear short of breath  Neck exam does have some loss of lordosis-patient does have mild tightness noted of the neck in the occipital area bilaterally.  Patient does have some weakness noted of the upper back musculature.  Low back does have significant increase in tightness noted as well.  Osteopathic findings  C2 flexed rotated and side bent right C7 flexed rotated and side bent left T4 extended rotated and side bent right inhaled rib T9 extended rotated and side bent left L2 flexed rotated and side bent right Sacrum right on right       Assessment and Plan:  Neck pain, bilateral Chronic problem with exacerbation.  Patient recently did have a titration of her CPAP that she thinks is potentially contributing.  We discussed with patient icing regimen and home exercises.  Discussed sleeping positions.  Patient does have a lot of stress at work and continues to have some difficulty with ergonomics there as well.  Discussed icing regimen and home exercises.  Continue the same medications at the moment.  We discussed the Zanaflex as  needed at night.  Follow-up again in 6 weeks    Nonallopathic problems  Decision today to treat with OMT was based on Physical Exam  After verbal consent patient was treated with HVLA, ME, FPR techniques in cervical, rib, thoracic, lumbar, and sacral  areas  Patient tolerated the procedure well with improvement in symptoms  Patient given exercises, stretches and lifestyle modifications  See  medications in patient instructions if given  Patient will follow up in 4-8 weeks      The above documentation has been reviewed and is accurate and complete Judi Saa, DO        Note: This dictation was prepared with Dragon dictation along with smaller phrase technology. Any transcriptional errors that result from this process are unintentional.

## 2021-03-30 ENCOUNTER — Encounter: Payer: Self-pay | Admitting: Family Medicine

## 2021-03-30 ENCOUNTER — Ambulatory Visit (INDEPENDENT_AMBULATORY_CARE_PROVIDER_SITE_OTHER): Payer: BC Managed Care – PPO | Admitting: Family Medicine

## 2021-03-30 ENCOUNTER — Other Ambulatory Visit: Payer: Self-pay

## 2021-03-30 VITALS — BP 110/86 | HR 66 | Ht 64.0 in | Wt 191.0 lb

## 2021-03-30 DIAGNOSIS — M9904 Segmental and somatic dysfunction of sacral region: Secondary | ICD-10-CM | POA: Diagnosis not present

## 2021-03-30 DIAGNOSIS — M9901 Segmental and somatic dysfunction of cervical region: Secondary | ICD-10-CM

## 2021-03-30 DIAGNOSIS — M9903 Segmental and somatic dysfunction of lumbar region: Secondary | ICD-10-CM

## 2021-03-30 DIAGNOSIS — M542 Cervicalgia: Secondary | ICD-10-CM | POA: Diagnosis not present

## 2021-03-30 DIAGNOSIS — M9902 Segmental and somatic dysfunction of thoracic region: Secondary | ICD-10-CM | POA: Diagnosis not present

## 2021-03-30 DIAGNOSIS — M9908 Segmental and somatic dysfunction of rib cage: Secondary | ICD-10-CM | POA: Diagnosis not present

## 2021-03-30 NOTE — Patient Instructions (Signed)
Try tart cherry longer See me in 5-6 weeks

## 2021-03-30 NOTE — Assessment & Plan Note (Signed)
Chronic problem with exacerbation.  Patient recently did have a titration of her CPAP that she thinks is potentially contributing.  We discussed with patient icing regimen and home exercises.  Discussed sleeping positions.  Patient does have a lot of stress at work and continues to have some difficulty with ergonomics there as well.  Discussed icing regimen and home exercises.  Continue the same medications at the moment.  We discussed the Zanaflex as needed at night.  Follow-up again in 6 weeks

## 2021-04-05 ENCOUNTER — Encounter: Payer: Self-pay | Admitting: Family Medicine

## 2021-05-05 NOTE — Progress Notes (Signed)
?Julia [name] D.O. ?[facility] ?[address] [place] [address] ?Phone: [phone] ?Subjective:   ?I, [name], am serving as a scribe for Dr. Antoine Primas. ? ?This visit occurred during the SARS-CoV-2 public health emergency.  Safety protocols were in place, including screening questions prior to the visit, additional usage of staff PPE, and extensive cleaning of exam room while observing appropriate contact time as indicated for disinfecting solutions.  ? ? ?I'm seeing this patient by the request  of:  [name], MD ? ?CC: Neck pain, vertigo, back pain last ? ?[id number]  ?Julia [name] is a 56 y.o. female coming in with complaint of back and neck pain. OMT 03/30/2021. Patient states that she is having increasing neck pain on R side after reaching into back of car. Massage has been helpful. Also having vertigo the last has been occurring for 2 days. Would like refill of valium from 2017.  ? ?Had some arrhythmia issues a few weeks ago that she mentions today as well.  ? ?Medications patient has been prescribed: None ? ?Taking: ? ? ?  ? ? ? ? ?Reviewed prior external information including notes and imaging from previsou exam, outside providers and external EMR if available.  ? ?As well as notes that were available from care everywhere and other healthcare systems. ? ?Past medical history, social, surgical and family history all reviewed in electronic medical record.  No pertanent information unless stated regarding to the chief complaint.  ? ?Past Medical History:  ?Diagnosis Date  ? Asthma with allergic rhinitis   ? Elevated liver enzymes   ? Liver disease   ? Fatty liver  ? Migraines   ? Polycystic ovaries 1984  ? Sleep apnea syndrome   ? Subclinical hypothyroidism   ?  ?Allergies  ?Allergen Reactions  ? Relafen [Nabumetone] Hives, Itching and Swelling  ? Sulfa Antibiotics   ? Clindamycin/Lincomycin Rash  ?  Oral ONLY  ? ? ? ?Review of Systems: ? No  visual changes, nausea,  vomiting, diarrhea, constipation,  abdominal pain, skin rash, fevers, chills, night sweats, weight loss, swollen lymph nodes, body aches, joint swelling, chest pain, shortness of breath, mood changes. POSITIVE muscle aches, dizziness, headache ? ?Objective  ?Blood pressure 118/86, pulse 70, height 5\' 4"  (1.626 m), weight 191 lb (86.6 kg), SpO2 98 %. ?  ?General: No apparent distress alert and oriented x3 mood and affect normal, dressed appropriately.  ?HEENT: Pupils equal, extraocular movements intact no sign of nystagmus ?Respiratory: Patient's speak in full sentences and does not appear short of breath  ?Cardiovascular: No lower extremity edema, non tender, no erythema  ?Low back exam does have some loss of lordosis.  Patient's neck exam also loss of lordosis with some decreased range of motion.  Mild crepitus noted.  Negative straight leg test noted. ? ?Osteopathic findings ? ?C2 flexed rotated and side bent right ?C4 flexed rotated and side bent left ?T3 extended rotated and side bent right inhaled rib ?L2 flexed rotated and side bent right ?L5 flexed rotated and side bent left ?Sacrum right on right ? ? ? ? ?  ?Assessment and Plan: ? ?Vertigo ?Has had difficulty with this recently but has seen cardiology who felt like PACs likely were not contributing.  Concern for more of a sleep apnea causing more of a discomfort.  Patient will be given vertigo.  Patient did have a prescription previously in 2017 and did not use any of them.  We will give some as  well for any type of breakthrough.  Patient knows if any worsening symptoms to seek medical attention immediately. ? ?Low back pain ?Chronic problem with exacerbation.  Discussed icing regimen and home exercises, which activities to doing which wants to avoid, increase activity slowly.  Refills of the medications including muscle relaxers.  Follow-up again in 6 to 8 weeks  ? ?Nonallopathic problems ? ?Decision today to treat with OMT was based on Physical  Exam ? ?After verbal consent patient was treated with HVLA, ME, FPR techniques in cervical, rib, thoracic, lumbar, and sacral  areas ? ?Patient tolerated the procedure well with improvement in symptoms ? ?Patient given exercises, stretches and lifestyle modifications ? ?See medications in patient instructions if given ? ?Patient will follow up in 4-8 weeks ? ?  ? ? ?The above documentation has been reviewed and is accurate and complete Judi Saa, DO ? ? ?  ? ? Note: This dictation was prepared with Dragon dictation along with smaller phrase technology. Any transcriptional errors that result from this process are unintentional.    ?  ?  ? ?

## 2021-05-06 ENCOUNTER — Other Ambulatory Visit: Payer: Self-pay

## 2021-05-06 ENCOUNTER — Ambulatory Visit (INDEPENDENT_AMBULATORY_CARE_PROVIDER_SITE_OTHER): Payer: BC Managed Care – PPO | Admitting: Family Medicine

## 2021-05-06 ENCOUNTER — Encounter: Payer: Self-pay | Admitting: Family Medicine

## 2021-05-06 VITALS — BP 118/86 | HR 70 | Ht 64.0 in | Wt 191.0 lb

## 2021-05-06 DIAGNOSIS — M9908 Segmental and somatic dysfunction of rib cage: Secondary | ICD-10-CM

## 2021-05-06 DIAGNOSIS — M9904 Segmental and somatic dysfunction of sacral region: Secondary | ICD-10-CM

## 2021-05-06 DIAGNOSIS — R42 Dizziness and giddiness: Secondary | ICD-10-CM

## 2021-05-06 DIAGNOSIS — M545 Low back pain, unspecified: Secondary | ICD-10-CM | POA: Diagnosis not present

## 2021-05-06 DIAGNOSIS — M9903 Segmental and somatic dysfunction of lumbar region: Secondary | ICD-10-CM

## 2021-05-06 DIAGNOSIS — G8929 Other chronic pain: Secondary | ICD-10-CM

## 2021-05-06 DIAGNOSIS — M9902 Segmental and somatic dysfunction of thoracic region: Secondary | ICD-10-CM

## 2021-05-06 DIAGNOSIS — M9901 Segmental and somatic dysfunction of cervical region: Secondary | ICD-10-CM

## 2021-05-06 MED ORDER — HYDROXYZINE HCL 25 MG PO TABS: 25.0000 mg | ORAL_TABLET | Freq: Three times a day (TID) | ORAL | 1 refills | Status: DC | PRN

## 2021-05-06 MED ORDER — DIAZEPAM 5 MG PO TABS: 5.0000 mg | ORAL_TABLET | Freq: Two times a day (BID) | ORAL | 0 refills | Status: AC | PRN

## 2021-05-06 MED ORDER — TIZANIDINE HCL 4 MG PO TABS: 4.0000 mg | ORAL_TABLET | Freq: Every day | ORAL | 0 refills | Status: DC

## 2021-05-06 NOTE — Patient Instructions (Signed)
Keep working on posture ?Try to exercises ?See me in 6-8 weeks ?

## 2021-05-06 NOTE — Assessment & Plan Note (Signed)
Chronic problem with exacerbation.  Discussed icing regimen and home exercises, which activities to doing which wants to avoid, increase activity slowly.  Refills of the medications including muscle relaxers.  Follow-up again in 6 to 8 weeks ?

## 2021-05-06 NOTE — Assessment & Plan Note (Signed)
Has had difficulty with this recently but has seen cardiology who felt like PACs likely were not contributing.  Concern for more of a sleep apnea causing more of a discomfort.  Patient will be given vertigo.  Patient did have a prescription previously in 2017 and did not use any of them.  We will give some as well for any type of breakthrough.  Patient knows if any worsening symptoms to seek medical attention immediately. ?

## 2021-06-23 NOTE — Progress Notes (Signed)
?Terrilee Files D.O. ?[facility] ?[address] [place] [address] ?Phone: [phone] ?Subjective:   ?I, [name], am serving as a scribe for Dr. Antoine Primas. ? ?This visit occurred during the SARS-CoV-2 public health emergency.  Safety protocols were in place, including screening questions prior to the visit, additional usage of staff PPE, and extensive cleaning of exam room while observing appropriate contact time as indicated for disinfecting solutions.  ?I'm seeing this patient by the request  of:  [name], MD ? ?CC: Neck and back pain follow-up ? ?[id number]  ?Julia [name] is a 56 y.o. female coming in with complaint of back and neck pain. OMT 05/06/2021. Patient states that she did have some L hip pain over GT for a day or 2 but then it subsided. Ankle pain is improving. Neck pain is unchanged.  ? ?Medications patient has been prescribed: Valium, Atarax, Zanaflex ? ?Taking: ? ? ?  ? ? ? ? ?Reviewed prior external information including notes and imaging from previsou exam, outside providers and external EMR if available.  ? ?As well as notes that were available from care everywhere and other healthcare systems. ? ?Past medical history, social, surgical and family history all reviewed in electronic medical record.  No pertanent information unless stated regarding to the chief complaint.  ? ?Past Medical History:  ?Diagnosis Date  ? Asthma with allergic rhinitis   ? Elevated liver enzymes   ? Liver disease   ? Fatty liver  ? Migraines   ? Polycystic ovaries 1984  ? Sleep apnea syndrome   ? Subclinical hypothyroidism   ?  ?Allergies  ?Allergen Reactions  ? Relafen [Nabumetone] Hives, Itching and Swelling  ? Sulfa Antibiotics   ? Clindamycin/Lincomycin Rash  ?  Oral ONLY  ? ? ? ?Review of Systems: ? No headache, visual changes, nausea, vomiting, diarrhea, constipation, dizziness, abdominal pain, skin rash, fevers, chills, night sweats, weight loss, swollen lymph nodes, body  aches, joint swelling, chest pain, shortness of breath, mood changes. POSITIVE muscle aches ? ?Objective  ?Blood pressure 112/78, pulse (!) 44, height 5\' 4"  (1.626 m), weight 191 lb (86.6 kg), SpO2 98 %. ?  ?General: No apparent distress alert and oriented x3 mood and affect normal, dressed appropriately.  ?HEENT: Pupils equal, extraocular movements intact  ?Respiratory: Patient's speak in full sentences and does not appear short of breath  ?Cardiovascular: No lower extremity edema, non tender, no erythema  ?Neck exam does have some mild loss of lordosis.  Tightness to the right paraspinal musculature in the parascapular region.  With limited sidebending of the neck on the right side. ? ?Osteopathic findings ? ?C2 flexed rotated and side bent right ?C6 flexed rotated and side bent right ?T3 extended rotated and side bent right inhaled rib ?T9 extended rotated and side bent left ?L2 flexed rotated and side bent right ?Sacrum right on right ? ? ? ? ?  ?Assessment and Plan: ? ?Neck pain, bilateral ?Chronic problem with mild exacerbation seems to be more right greater than left.  Patient is going to continue to work on .  Patient has a muscle relaxer secondary for anxiety exacerbations to take at night.  Patient has been working longer hours and this is likely contributing to some of it.  Likely no significant radicular symptoms.  Follow-up again in 6 to 8 weeks  ? ?Nonallopathic problems ? ?Decision today to treat with OMT was based on Physical Exam ? ?After verbal consent patient was  treated with HVLA, ME, FPR techniques in cervical, rib, thoracic, lumbar, and sacral  areas ? ?Patient tolerated the procedure well with improvement in symptoms ? ?Patient given exercises, stretches and lifestyle modifications ? ?See medications in patient instructions if given ? ?Patient will follow up in 4-8 weeks ? ?  ? ?The above documentation has been reviewed and is accurate and complete Judi Saa,  DO ? ? ? ?  ? ? Note: This dictation was prepared with Dragon dictation along with smaller phrase technology. Any transcriptional errors that result from this process are unintentional.    ?  ?  ? ?

## 2021-06-24 ENCOUNTER — Ambulatory Visit (INDEPENDENT_AMBULATORY_CARE_PROVIDER_SITE_OTHER): Payer: BC Managed Care – PPO | Admitting: Family Medicine

## 2021-06-24 VITALS — BP 112/78 | HR 44 | Ht 64.0 in | Wt 191.0 lb

## 2021-06-24 DIAGNOSIS — M9904 Segmental and somatic dysfunction of sacral region: Secondary | ICD-10-CM | POA: Diagnosis not present

## 2021-06-24 DIAGNOSIS — M542 Cervicalgia: Secondary | ICD-10-CM | POA: Diagnosis not present

## 2021-06-24 DIAGNOSIS — M9908 Segmental and somatic dysfunction of rib cage: Secondary | ICD-10-CM | POA: Diagnosis not present

## 2021-06-24 DIAGNOSIS — M9902 Segmental and somatic dysfunction of thoracic region: Secondary | ICD-10-CM

## 2021-06-24 DIAGNOSIS — M9903 Segmental and somatic dysfunction of lumbar region: Secondary | ICD-10-CM | POA: Diagnosis not present

## 2021-06-24 DIAGNOSIS — M9901 Segmental and somatic dysfunction of cervical region: Secondary | ICD-10-CM | POA: Diagnosis not present

## 2021-06-24 NOTE — Assessment & Plan Note (Signed)
Chronic problem with mild exacerbation seems to be more right greater than left.  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  Patient has a muscle relaxer secondary for anxiety exacerbations to take at night.  Patient has been working longer hours and this is likely contributing to some of it.  Likely no significant radicular symptoms.  Follow-up again in 6 to 8 weeks ?

## 2021-06-24 NOTE — Patient Instructions (Signed)
Good to see you ?You have the stool if you need it ?Stay active ?Find time for yourself ?See me in 7-8 weeks ?

## 2021-08-11 NOTE — Progress Notes (Signed)
[name] [address] [place] [address] Phone: [phone] Subjective:   [name], am serving as a scribe for Dr. Antoine Primas.   I'm seeing this patient by the request  of:  [name], MD  CC: back and neck pain   [id number]  Syndee Zieman is a 56 y.o. female coming in with complaint of back and neck pain. OMT 06/24/2021. Patient states that her neck is the same. Trying to adjust rx for glasses and her neck is slightly more painful today due to these adjustments.   Medications patient has been prescribed: Hydroxizine, Zanaflex, Valium Taking:         Reviewed prior external information including notes and imaging from previsou exam, outside providers and external EMR if available.   As well as notes that were available from care everywhere and other healthcare systems.  Past medical history, social, surgical and family history all reviewed in electronic medical record.  No pertanent information unless stated regarding to the chief complaint.   Past Medical History:  Diagnosis Date   Asthma with allergic rhinitis    Elevated liver enzymes    Liver disease    Fatty liver   Migraines    Polycystic ovaries 1984   Sleep apnea syndrome    Subclinical hypothyroidism     Allergies  Allergen Reactions   Relafen [Nabumetone] Hives, Itching and Swelling   Sulfa Antibiotics    Clindamycin/Lincomycin Rash    Oral ONLY     Review of Systems:  No headache, visual changes, nausea, vomiting, diarrhea, constipation, dizziness, abdominal pain, skin rash, fevers, chills, night sweats, weight loss, swollen lymph nodes, body aches, joint swelling, chest pain, shortness of breath, mood changes. POSITIVE muscle aches  Objective  Blood pressure 110/82, pulse 69, height 5\' 4"  (1.626 m), weight 193 lb (87.5 kg).   General: No apparent distress alert and oriented x3 mood and affect normal, dressed appropriately.  HEENT:  Pupils equal, extraocular movements intact  Respiratory: Patient's speak in full sentences and does not appear short of breath  Cardiovascular: No lower extremity edema, non tender, no erythema  Loss of lordosis of the back. Positive FABER patient does have some limited sidebending of the neck bilaterally.  Patient does have some pain with extension of the neck also. Foot exam does show pes planus with overpronation of the hindfoot noted.  Patient is tender to palpation minorly over the midfoot on the left foot.  No significant swelling noted.  Mild positive squeeze test on the left side.  Osteopathic findings  C2 flexed rotated and side bent right C5 flexed rotated and side bent left T3 extended rotated and side bent right inhaled rib T7 extended rotated and side bent left L2 flexed rotated and side bent right Sacrum right on right       Assessment and Plan:  Leg cramp Patient does have some leg cramping noted.  We discussed with patient that I do feel that we should rule out vascular compromise.  We will get ABIs.  Otherwise discussed that mechanically this could be more secondary to tightness of the muscles, electrolyte abnormalities, or the possibility of poor shoes.  Patient will try to make the changes including a multivitamin otherwise we will get this.  Patient knows if any swelling or worsening pain to seek medical attention.    Nonallopathic problems  Decision today to treat with OMT was based on Physical Exam  After verbal consent patient was treated  with HVLA, ME, FPR techniques in cervical, rib, thoracic, lumbar, and sacral  areas  Patient tolerated the procedure well with improvement in symptoms  Patient given exercises, stretches and lifestyle modifications  See medications in patient instructions if given  Patient will follow up in 4-8 weeks      The above documentation has been reviewed and is accurate and complete Lyndal Pulley, DO        Note: This  dictation was prepared with Dragon dictation along with smaller phrase technology. Any transcriptional errors that result from this process are unintentional.

## 2021-08-12 ENCOUNTER — Ambulatory Visit (INDEPENDENT_AMBULATORY_CARE_PROVIDER_SITE_OTHER): Payer: BC Managed Care – PPO | Admitting: Family Medicine

## 2021-08-12 VITALS — BP 110/82 | HR 69 | Ht 64.0 in | Wt 193.0 lb

## 2021-08-12 DIAGNOSIS — R252 Cramp and spasm: Secondary | ICD-10-CM | POA: Insufficient documentation

## 2021-08-12 DIAGNOSIS — M9903 Segmental and somatic dysfunction of lumbar region: Secondary | ICD-10-CM

## 2021-08-12 DIAGNOSIS — M9904 Segmental and somatic dysfunction of sacral region: Secondary | ICD-10-CM

## 2021-08-12 DIAGNOSIS — M9908 Segmental and somatic dysfunction of rib cage: Secondary | ICD-10-CM | POA: Diagnosis not present

## 2021-08-12 DIAGNOSIS — M79604 Pain in right leg: Secondary | ICD-10-CM | POA: Diagnosis not present

## 2021-08-12 DIAGNOSIS — M542 Cervicalgia: Secondary | ICD-10-CM | POA: Diagnosis not present

## 2021-08-12 DIAGNOSIS — M9901 Segmental and somatic dysfunction of cervical region: Secondary | ICD-10-CM | POA: Diagnosis not present

## 2021-08-12 DIAGNOSIS — M79605 Pain in left leg: Secondary | ICD-10-CM

## 2021-08-12 DIAGNOSIS — M9902 Segmental and somatic dysfunction of thoracic region: Secondary | ICD-10-CM

## 2021-08-12 NOTE — Assessment & Plan Note (Signed)
Patient does have some leg cramping noted.  We discussed with patient that I do feel that we should rule out vascular compromise.  We will get ABIs.  Otherwise discussed that mechanically this could be more secondary to tightness of the muscles, electrolyte abnormalities, or the possibility of poor shoes.  Patient will try to make the changes including a multivitamin otherwise we will get this.  Patient knows if any swelling or worsening pain to seek medical attention.

## 2021-08-12 NOTE — Patient Instructions (Addendum)
Good to see you Walk in rocker bottom shoes: Gravity Defyer, HOKA, Prophet  ABI B  [facility]  ([facility] [employer] in [facility]) [address] [place], [place] [address] [phone] 08/26/2021 2:00pm (Arrive at 1:45pm)  1/2 multivitamin on days you work out  See me in 6 weeks

## 2021-08-12 NOTE — Assessment & Plan Note (Signed)
Patient has had neck pain for quite some time.  Discussed icing regimen and home exercises.  Patient is responding well to manipulation as well.  Follow-up again in 6 to 8 weeks

## 2021-08-25 ENCOUNTER — Other Ambulatory Visit: Payer: Self-pay | Admitting: Family Medicine

## 2021-08-25 DIAGNOSIS — M79604 Pain in right leg: Secondary | ICD-10-CM

## 2021-08-25 DIAGNOSIS — R252 Cramp and spasm: Secondary | ICD-10-CM

## 2021-08-26 ENCOUNTER — Ambulatory Visit (HOSPITAL_COMMUNITY)
Admission: RE | Admit: 2021-08-26 | Discharge: 2021-08-26 | Disposition: A | Discharge status: Home / Self Care | Payer: BC Managed Care – PPO | Source: Ambulatory Visit | Attending: Internal Medicine | Admitting: Internal Medicine

## 2021-08-26 DIAGNOSIS — M79604 Pain in right leg: Secondary | ICD-10-CM | POA: Insufficient documentation

## 2021-08-26 DIAGNOSIS — M79605 Pain in left leg: Secondary | ICD-10-CM | POA: Diagnosis present

## 2021-09-29 NOTE — Progress Notes (Unsigned)
[name] [address] [place] [address] Phone: [phone] Subjective:   [name], am serving as a scribe for Dr. Antoine Primas.  I'm seeing this patient by the request  of:  [name], MD  CC: Back and neck pain follow-up  [id number]  Julia [name] is a 56 y.o. female coming in with complaint of back and neck pain. OMT 08/12/2021. Patient states that she was moving boxes earlier this week and did something to her back. No radicular symptoms. Using ice and IBU.    Medications patient has been prescribed: None  Taking:         Reviewed prior external information including notes and imaging from previsou exam, outside providers and external EMR if available.   As well as notes that were available from care everywhere and other healthcare systems.  Past medical history, social, surgical and family history all reviewed in electronic medical record.  No pertanent information unless stated regarding to the chief complaint.   Past Medical History:  Diagnosis Date   Asthma with allergic rhinitis    Elevated liver enzymes    Liver disease    Fatty liver   Migraines    Polycystic ovaries 1984   Sleep apnea syndrome    Subclinical hypothyroidism     Allergies  Allergen Reactions   Relafen [Nabumetone] Hives, Itching and Swelling   Sulfa Antibiotics    Clindamycin/Lincomycin Rash    Oral ONLY     Review of Systems:  No headache, visual changes, nausea, vomiting, diarrhea, constipation, dizziness, abdominal pain, skin rash, fevers, chills, night sweats, weight loss, swollen lymph nodes, body aches, joint swelling, chest pain, shortness of breath, mood changes. POSITIVE muscle aches  Objective  Blood pressure 116/82, pulse (!) 57, height 5\' 4"  (1.626 m), weight 197 lb (89.4 kg), SpO2 97 %.   General: No apparent distress alert and oriented x3 mood and affect normal, dressed appropriately.  HEENT: Pupils equal,  extraocular movements intact  Respiratory: Patient's speak in full sentences and does not appear short of breath  Cardiovascular: No lower extremity edema, non tender, no erythema  Gait MSK:  Back back exam does have some loss of lordosis.  Neck exam does have some limited sidebending bilaterally.  Tightness with FABER test noted as well.  Patient has multiple old bruises noted on the extremities.  Seems to be healing well.  Osteopathic findings  C4 flexed rotated and side bent right C5 flexed rotated and side bent left T3 extended rotated and side bent right inhaled rib T7 extended rotated and side bent left L2 flexed rotated and side bent right L4 flexed rotated and side bent right L5 flexed rotated and side bent left Sacrum right on right       Assessment and Plan:  No problem-specific Assessment & Plan notes found for this encounter.    Nonallopathic problems  Decision today to treat with OMT was based on Physical Exam  After verbal consent patient was treated with HVLA, ME, FPR techniques in cervical, rib, thoracic, lumbar, and sacral  areas  Patient tolerated the procedure well with improvement in symptoms  Patient given exercises, stretches and lifestyle modifications  See medications in patient instructions if given  Patient will follow up in 4-8 weeks     The above documentation has been reviewed and is accurate and complete , DO         Note: This dictation was prepared with Dragon dictation  along with smaller phrase technology. Any transcriptional errors that result from this process are unintentional.

## 2021-09-30 ENCOUNTER — Ambulatory Visit (INDEPENDENT_AMBULATORY_CARE_PROVIDER_SITE_OTHER): Payer: BC Managed Care – PPO | Admitting: Family Medicine

## 2021-09-30 VITALS — BP 116/82 | HR 57 | Ht 64.0 in | Wt 197.0 lb

## 2021-09-30 DIAGNOSIS — M9903 Segmental and somatic dysfunction of lumbar region: Secondary | ICD-10-CM

## 2021-09-30 DIAGNOSIS — M9908 Segmental and somatic dysfunction of rib cage: Secondary | ICD-10-CM | POA: Diagnosis not present

## 2021-09-30 DIAGNOSIS — G8929 Other chronic pain: Secondary | ICD-10-CM | POA: Diagnosis not present

## 2021-09-30 DIAGNOSIS — M9904 Segmental and somatic dysfunction of sacral region: Secondary | ICD-10-CM

## 2021-09-30 DIAGNOSIS — M545 Low back pain, unspecified: Secondary | ICD-10-CM

## 2021-09-30 DIAGNOSIS — M9901 Segmental and somatic dysfunction of cervical region: Secondary | ICD-10-CM

## 2021-09-30 DIAGNOSIS — M9902 Segmental and somatic dysfunction of thoracic region: Secondary | ICD-10-CM

## 2021-09-30 NOTE — Patient Instructions (Signed)
Good to see you Get help with the moves COOP Pillow See me in 5-6 weeks

## 2021-09-30 NOTE — Assessment & Plan Note (Signed)
Low back exam does have some loss of lordosis.  Patient does have some tightness noted.  Does respond well to manipulation.  Maybe some mild increase of weight and some weakness.  Flexeril be contributing to some of the exacerbation at the moment.  Also patient has been doing some lifting and moving that is likely contributing as well.  Follow-up again in 6 to 8 weeks otherwise

## 2021-10-18 ENCOUNTER — Other Ambulatory Visit: Payer: Self-pay | Admitting: Family Medicine

## 2021-10-18 ENCOUNTER — Encounter: Payer: Self-pay | Admitting: Family Medicine

## 2021-10-18 MED ORDER — PREDNISONE 20 MG PO TABS: 40.0000 mg | ORAL_TABLET | Freq: Every day | ORAL | 0 refills | Status: DC

## 2021-11-04 NOTE — Progress Notes (Signed)
[name] [address] [place] [address] Phone: [phone] Subjective:   Julia [name], am serving as a scribe for Dr. Antoine Primas.  I'm seeing this patient by the request  of:  [name], MD  CC: back and neck pain   [id number]  Aricka Goldberger is a 56 y.o. female coming in with complaint of back and neck pain. OMT on 09/30/2021. Patient states that her neck is more painful than last visit. Prednisone did help after last visit.   Also complaining of R hip pain. Feels unstable deeper within the joint with stair climbing.  Medications patient has been prescribed: Prednisone  Taking:         Reviewed prior external information including notes and imaging from previsou exam, outside providers and external EMR if available.   As well as notes that were available from care everywhere and other healthcare systems.  Past medical history, social, surgical and family history all reviewed in electronic medical record.  No pertanent information unless stated regarding to the chief complaint.   Past Medical History:  Diagnosis Date   Asthma with allergic rhinitis    Elevated liver enzymes    Liver disease    Fatty liver   Migraines    Polycystic ovaries 1984   Sleep apnea syndrome    Subclinical hypothyroidism     Allergies  Allergen Reactions   Relafen [Nabumetone] Hives, Itching and Swelling   Sulfa Antibiotics    Clindamycin/Lincomycin Rash    Oral ONLY     Review of Systems:  No  visual changes, nausea, vomiting, diarrhea, constipation, dizziness, abdominal pain, skin rash, fevers, chills, night sweats, weight loss, swollen lymph nodes, body aches, joint swelling, chest pain, shortness of breath, mood changes. POSITIVE muscle aches, headache  Objective  Blood pressure 118/84, pulse 67, height 5\' 4"  (1.626 m), weight 199 lb (90.3 kg), SpO2 95 %.   General: No apparent distress alert and oriented x3 mood and  affect normal, dressed appropriately.  HEENT: Pupils equal, extraocular movements intact  Respiratory: Patient's speak in full sentences and does not appear short of breath  Cardiovascular: No lower extremity edema, non tender, no erythema  Gait MSK:  Back low back exam does have some loss of lordosis.  Some tightness noted in the paraspinal musculature.  Patient does have a positive FABER test noted as well.  Hip exam does have tenderness over the greater trochanteric area.  Osteopathic findings  C2 flexed rotated and side bent right C5 flexed rotated and side bent right T5 extended rotated and side bent right inhaled rib T8 extended rotated and side bent left L2 flexed rotated and side bent right Sacrum right on right     Assessment and Plan:  Low back pain Low back does have some loss of lordosis, discussed posture and ergonomics.  I do believe the patient does have a gluteal tendinitis as well as the greater trochanteric bursitis.  May need to consider injection if this continues.  Follow-up again in 6 to 8 weeks     Nonallopathic problems  Decision today to treat with OMT was based on Physical Exam  After verbal consent patient was treated with HVLA, ME, FPR techniques in cervical, rib, thoracic, lumbar, and sacral  areas  Patient tolerated the procedure well with improvement in symptoms  Patient given exercises, stretches and lifestyle modifications  See medications in patient instructions if given  Patient will follow up in 4-8 weeks  The above documentation has been reviewed and is accurate and complete Lyndal Pulley, DO         Note: This dictation was prepared with Dragon dictation along with smaller phrase technology. Any transcriptional errors that result from this process are unintentional.

## 2021-11-11 ENCOUNTER — Ambulatory Visit (INDEPENDENT_AMBULATORY_CARE_PROVIDER_SITE_OTHER): Payer: BC Managed Care – PPO | Admitting: Family Medicine

## 2021-11-11 ENCOUNTER — Encounter: Payer: Self-pay | Admitting: Family Medicine

## 2021-11-11 ENCOUNTER — Ambulatory Visit (INDEPENDENT_AMBULATORY_CARE_PROVIDER_SITE_OTHER): Payer: BC Managed Care – PPO

## 2021-11-11 VITALS — BP 118/84 | HR 67 | Ht 64.0 in | Wt 199.0 lb

## 2021-11-11 DIAGNOSIS — M9903 Segmental and somatic dysfunction of lumbar region: Secondary | ICD-10-CM | POA: Diagnosis not present

## 2021-11-11 DIAGNOSIS — M9902 Segmental and somatic dysfunction of thoracic region: Secondary | ICD-10-CM

## 2021-11-11 DIAGNOSIS — G8929 Other chronic pain: Secondary | ICD-10-CM

## 2021-11-11 DIAGNOSIS — M25551 Pain in right hip: Secondary | ICD-10-CM

## 2021-11-11 DIAGNOSIS — M9904 Segmental and somatic dysfunction of sacral region: Secondary | ICD-10-CM

## 2021-11-11 DIAGNOSIS — M9901 Segmental and somatic dysfunction of cervical region: Secondary | ICD-10-CM | POA: Diagnosis not present

## 2021-11-11 DIAGNOSIS — M545 Low back pain, unspecified: Secondary | ICD-10-CM

## 2021-11-11 DIAGNOSIS — M9908 Segmental and somatic dysfunction of rib cage: Secondary | ICD-10-CM | POA: Diagnosis not present

## 2021-11-11 NOTE — Patient Instructions (Addendum)
Xray today Good to see you! Do prescribed exercises at least 3x a week Ice after lots of activity

## 2021-11-11 NOTE — Assessment & Plan Note (Signed)
Low back does have some loss of lordosis, discussed posture and ergonomics.  I do believe the patient does have a gluteal tendinitis as well as the greater trochanteric bursitis.  May need to consider injection if this continues.  Follow-up again in 6 to 8 weeks

## 2021-12-22 NOTE — Progress Notes (Unsigned)
[name] [address] [place] Phone: [phone] Subjective:   Julia [name], am serving as a scribe for Dr. Hulan Saas.  I'm seeing this patient by the request  of:  [name], MD  CC: back and neck pain   [id number]  Julia [name] is a 56 y.o. female coming in with complaint of back and neck pain. OMT 11/11/2021. Patient states doing well. Has questions about last xray that was taken.  Medications patient has been prescribed:   Taking:  Xray R hip 11/11/2021 IMPRESSION: Mild osteoarthritis of the hips.  No acute osseous abnormality.       Reviewed prior external information including notes and imaging from previsou exam, outside providers and external EMR if available.   As well as notes that were available from care everywhere and other healthcare systems.  Past medical history, social, surgical and family history all reviewed in electronic medical record.  No pertanent information unless stated regarding to the chief complaint.   Past Medical History:  Diagnosis Date   Asthma with allergic rhinitis    Elevated liver enzymes    Liver disease    Fatty liver   Migraines    Polycystic ovaries 1984   Sleep apnea syndrome    Subclinical hypothyroidism     Allergies  Allergen Reactions   Relafen [Nabumetone] Hives, Itching and Swelling   Sulfa Antibiotics    Clindamycin/Lincomycin Rash    Oral ONLY     Review of Systems:  No headache, visual changes, nausea, vomiting, diarrhea, constipation, dizziness, abdominal pain, skin rash, fevers, chills, night sweats, weight loss, swollen lymph nodes, body aches, joint swelling, chest pain, shortness of breath, mood changes. POSITIVE muscle aches  Objective  Blood pressure 122/86, pulse 68, height 5\' 4"  (1.626 m), weight 201 lb (91.2 kg), SpO2 97 %.   General: No apparent distress alert and oriented x3 mood and affect normal, dressed appropriately.   HEENT: Pupils equal, extraocular movements intact  Respiratory: Patient's speak in full sentences and does not appear short of breath  Cardiovascular: No lower extremity edema, non tender, no erythema  Gait MSK:  Back does have some loss of lordosis.  Some tenderness to palpation in the paraspinal musculature.  Patient's neck exam does have significant loss of lordosis.  Significant tightness at the occipital area right greater than left.  Negative Spurling's noted.  Osteopathic findings  C2 flexed rotated and side bent right C3 flexed rotated and side bent left T3 extended rotated and side bent right inhaled rib T9 extended rotated and side bent left L2 flexed rotated and side bent right Sacrum right on right       Assessment and Plan:  Low back pain Continues to have low back and gluteal tendon pain.  We discussed again about MRI of the lower back as well as the hip.  Patient wants to hold on that because it is seems to be more intermittent.  We discussed icing regimen and home exercises, which activities to do which ones to avoid.  Increase activity slowly over the course the next several weeks.  Follow-up with me again 6 to 8 weeks    Nonallopathic problems  Decision today to treat with OMT was based on Physical Exam  After verbal consent patient was treated with HVLA, ME, FPR techniques in cervical, rib, thoracic, lumbar, and sacral  areas  Patient tolerated the procedure well with improvement in symptoms  Patient given exercises, stretches and  lifestyle modifications  See medications in patient instructions if given  Patient will follow up in 4-8 weeks             Note: This dictation was prepared with Dragon dictation along with smaller phrase technology. Any transcriptional errors that result from this process are unintentional.

## 2021-12-23 ENCOUNTER — Encounter: Payer: Self-pay | Admitting: Family Medicine

## 2021-12-23 ENCOUNTER — Ambulatory Visit (INDEPENDENT_AMBULATORY_CARE_PROVIDER_SITE_OTHER): Payer: BC Managed Care – PPO | Admitting: Family Medicine

## 2021-12-23 VITALS — BP 122/86 | HR 68 | Ht 64.0 in | Wt 201.0 lb

## 2021-12-23 DIAGNOSIS — M9908 Segmental and somatic dysfunction of rib cage: Secondary | ICD-10-CM | POA: Diagnosis not present

## 2021-12-23 DIAGNOSIS — M9904 Segmental and somatic dysfunction of sacral region: Secondary | ICD-10-CM | POA: Diagnosis not present

## 2021-12-23 DIAGNOSIS — M9901 Segmental and somatic dysfunction of cervical region: Secondary | ICD-10-CM

## 2021-12-23 DIAGNOSIS — M545 Low back pain, unspecified: Secondary | ICD-10-CM

## 2021-12-23 DIAGNOSIS — M9902 Segmental and somatic dysfunction of thoracic region: Secondary | ICD-10-CM

## 2021-12-23 DIAGNOSIS — M9903 Segmental and somatic dysfunction of lumbar region: Secondary | ICD-10-CM

## 2021-12-23 DIAGNOSIS — G8929 Other chronic pain: Secondary | ICD-10-CM | POA: Diagnosis not present

## 2021-12-23 NOTE — Patient Instructions (Signed)
Try no the lace last eye on shoes Good luck with the dentist

## 2021-12-23 NOTE — Assessment & Plan Note (Signed)
Continues to have low back and gluteal tendon pain.  We discussed again about MRI of the lower back as well as the hip.  Patient wants to hold on that because it is seems to be more intermittent.  We discussed icing regimen and home exercises, which activities to do which ones to avoid.  Increase activity slowly over the course the next several weeks.  Follow-up with me again 6 to 8 weeks

## 2022-02-16 ENCOUNTER — Other Ambulatory Visit: Payer: Self-pay | Admitting: Family Medicine

## 2022-02-16 DIAGNOSIS — R2689 Other abnormalities of gait and mobility: Secondary | ICD-10-CM

## 2022-02-16 DIAGNOSIS — R413 Other amnesia: Secondary | ICD-10-CM

## 2022-02-16 DIAGNOSIS — G43719 Chronic migraine without aura, intractable, without status migrainosus: Secondary | ICD-10-CM

## 2022-02-17 NOTE — Progress Notes (Signed)
[name] [address] [place] [address] Phone: [phone] Subjective:   Julia [name], am serving as a scribe for Dr. Antoine Primas.  I'm seeing this patient by the request  of:  [name], MD  CC: Back and neck pain follow-up  [id number]  Julia [name] is a 56 y.o. female coming in with complaint of back and neck pain. OMT 12/23/2021.  Patient states neck as usual but is having issue with her left heel. Left heel pain has been going on for about a month or so, states it feels numb sometimes.   Medications patient has been prescribed: None  Taking:         Reviewed prior external information including notes and imaging from previsou exam, outside providers and external EMR if available.   As well as notes that were available from care everywhere and other healthcare systems.  Past medical history, social, surgical and family history all reviewed in electronic medical record.  No pertanent information unless stated regarding to the chief complaint.   Past Medical History:  Diagnosis Date   Asthma with allergic rhinitis    Elevated liver enzymes    Liver disease    Fatty liver   Migraines    Polycystic ovaries 1984   Sleep apnea syndrome    Subclinical hypothyroidism     Allergies  Allergen Reactions   Relafen [Nabumetone] Hives, Itching and Swelling   Sulfa Antibiotics    Clindamycin/Lincomycin Rash    Oral ONLY     Review of Systems:  No headache, visual changes, nausea, vomiting, diarrhea, constipation, dizziness, abdominal pain, skin rash, fevers, chills, night sweats, weight loss, swollen lymph nodes, body aches, joint swelling, chest pain, shortness of breath, mood changes. POSITIVE muscle aches  Objective  Blood pressure 110/82, pulse 78, height 5\' 4"  (1.626 m), weight 198 lb (89.8 kg), SpO2 94 %.   General: No apparent distress alert and oriented x3 mood and affect normal, dressed  appropriately.  HEENT: Pupils equal, extraocular movements intact  Respiratory: Patient's speak in full sentences and does not appear short of breath  Cardiovascular: No lower extremity edema, non tender, no erythema  Low back does have poor core strength noted.  Tenderness to palpation in the paraspinal musculature.  Seems to have tightness mostly of the right FABER test.  Limited sidebending of the neck bilaterally.  Osteopathic findings  C3 flexed rotated and side bent right C6 flexed rotated and side bent right T3 extended rotated and side bent right inhaled rib L2 flexed rotated and side bent right Sacrum right on right       Assessment and Plan:  Low back pain Seems to have more difficulty with tightness of the neck noted today.  Discussed with patient about icing regimen and home exercises, which activities to do and which ones to avoid.  Follow-up again in 6 to 8 weeks otherwise. Discussed medications at this time as well including Skelaxin 800 mg versus the Zanaflex 4 mg.  Nonallopathic problems  Decision today to treat with OMT was based on Physical Exam  After verbal consent patient was treated with HVLA, ME, FPR techniques in cervical, rib, thoracic, lumbar, and sacral  areas  Patient tolerated the procedure well with improvement in symptoms  Patient given exercises, stretches and lifestyle modifications  See medications in patient instructions if given  Patient will follow up in 4-8 weeks     The above documentation has been reviewed and is accurate  and complete Lyndal Pulley, DO         Note: This dictation was prepared with Dragon dictation along with smaller phrase technology. Any transcriptional errors that result from this process are unintentional.

## 2022-02-22 ENCOUNTER — Ambulatory Visit (INDEPENDENT_AMBULATORY_CARE_PROVIDER_SITE_OTHER): Payer: BC Managed Care – PPO | Admitting: Family Medicine

## 2022-02-22 ENCOUNTER — Encounter: Payer: Self-pay | Admitting: Family Medicine

## 2022-02-22 VITALS — BP 110/82 | HR 78 | Ht 64.0 in | Wt 198.0 lb

## 2022-02-22 DIAGNOSIS — M545 Low back pain, unspecified: Secondary | ICD-10-CM

## 2022-02-22 DIAGNOSIS — M9903 Segmental and somatic dysfunction of lumbar region: Secondary | ICD-10-CM

## 2022-02-22 DIAGNOSIS — M9901 Segmental and somatic dysfunction of cervical region: Secondary | ICD-10-CM

## 2022-02-22 DIAGNOSIS — M9902 Segmental and somatic dysfunction of thoracic region: Secondary | ICD-10-CM

## 2022-02-22 DIAGNOSIS — G8929 Other chronic pain: Secondary | ICD-10-CM

## 2022-02-22 DIAGNOSIS — M9908 Segmental and somatic dysfunction of rib cage: Secondary | ICD-10-CM

## 2022-02-22 DIAGNOSIS — M9904 Segmental and somatic dysfunction of sacral region: Secondary | ICD-10-CM

## 2022-02-22 NOTE — Patient Instructions (Signed)
Good to see you  Watch the leg see if any shoes are contributing Follow up 6-8 weeks

## 2022-02-22 NOTE — Assessment & Plan Note (Signed)
Seems to have more difficulty with tightness of the neck noted today.  Discussed with patient about icing regimen and home exercises, which activities to do and which ones to avoid.  Follow-up again in 6 to 8 weeks otherwise.

## 2022-03-06 ENCOUNTER — Ambulatory Visit
Admission: RE | Admit: 2022-03-06 | Discharge: 2022-03-06 | Disposition: A | Discharge status: Home / Self Care | Payer: BC Managed Care – PPO | Source: Ambulatory Visit | Attending: Family Medicine | Admitting: Family Medicine

## 2022-03-06 DIAGNOSIS — G43719 Chronic migraine without aura, intractable, without status migrainosus: Secondary | ICD-10-CM

## 2022-03-06 DIAGNOSIS — R2689 Other abnormalities of gait and mobility: Secondary | ICD-10-CM

## 2022-03-06 DIAGNOSIS — R413 Other amnesia: Secondary | ICD-10-CM

## 2022-04-07 NOTE — Progress Notes (Signed)
[place] Phone: [phone] Subjective:   Julia Julia [name], am serving as a scribe for Dr. Hulan Saas.  I'm seeing this patient by the request  of:  Michell Heinrich, MD  CC: Neck and low back pain follow-up  [name]  Julia Julia [name] is a 57 y.o. female coming in with complaint of back and neck pain. OMT 02/22/2022. Patient states that she is the same as last visit. Continued pain in her neck.  Recently did have a upper respiratory illness that did cause patient to have significant amount of coughing.  Patient states that since then has had more neck and back pain.  Patient states that even at night sometimes difficulty finding a comfortable area.  Medications patient has been prescribed: None  Taking:         Reviewed prior external Julia [name] including notes and imaging from previsou exam, outside providers and external EMR if available.   As well as notes that were available from care everywhere and other healthcare systems.  Past medical history, social, surgical and family history all reviewed in electronic medical record.  Julia Julia [name] unless stated regarding to the chief complaint.   Past Medical History:  Diagnosis Date   Asthma with allergic rhinitis    Elevated liver enzymes    Liver disease    Fatty liver   Migraines    Polycystic ovaries 1984   Sleep apnea syndrome    Subclinical hypothyroidism     Allergies  Allergen Reactions   Relafen [Nabumetone] Hives, Itching and Swelling   Sulfa Antibiotics    Clindamycin/Lincomycin Rash    Oral ONLY     Review of Systems:  Julia Julia [name], visual changes, nausea, vomiting, diarrhea, constipation, dizziness, abdominal pain, skin rash, fevers, chills, night sweats, weight loss, swollen lymph nodes, body aches, joint swelling, chest pain, shortness of breath, mood changes. POSITIVE muscle aches  Objective  Blood pressure  102/82, pulse 68, height 5\' 4"  (1.626 m), weight 198 lb (89.8 kg), SpO2 97 %.   General: Julia Julia [name] mood and affect normal, dressed appropriately.  HEENT: Pupils equal, extraocular movements intact  Respiratory: Patient's speak in full sentences and does not appear short of breath  Cardiovascular: Julia Julia [name], non tender, Julia Julia [name]  Upper back exam does have some tenderness to palpation more in the parascapular area right greater than left.  Tightness also noted in the right parascapular region of the neck area at the cervical thoracic area.  Tightness with FABER test right greater than left  Osteopathic findings  C2 flexed rotated and side bent right C7 flexed rotated and side bent right T2 extended rotated and side bent right inhaled rib T9 extended rotated and side bent left L2 flexed rotated and side bent right Sacrum right on right    Assessment and Plan:  Low back pain Patient did have a recent illness.  Likely contributing to some of the discomfort and pain.  Was doing a lot more coughing.  Did have some good range of motion noted, discussed icing regimen and home exercises, which activities to do and which ones to avoid.  Increase activity slowly.  Discussed 6 to 8 weeks follow-up.    Nonallopathic problems  Decision today to treat with OMT was based on Physical Exam  After verbal consent patient was treated with HVLA, ME, FPR techniques in cervical, rib, thoracic, lumbar, and sacral  areas  Patient  tolerated the procedure well with improvement in symptoms  Patient given exercises, stretches and lifestyle modifications  See medications in patient instructions if given  Patient will follow up in 4-8 weeks    The above documentation has been reviewed and is accurate and complete Julia Pulley, DO          Note: This dictation was prepared with Dragon dictation along with smaller phrase technology. Any  transcriptional errors that result from this process are unintentional.

## 2022-04-12 ENCOUNTER — Ambulatory Visit (INDEPENDENT_AMBULATORY_CARE_PROVIDER_SITE_OTHER): Payer: BC Managed Care – PPO | Admitting: Family Medicine

## 2022-04-12 VITALS — BP 102/82 | HR 68 | Ht 64.0 in | Wt 198.0 lb

## 2022-04-12 DIAGNOSIS — M545 Low back pain, unspecified: Secondary | ICD-10-CM

## 2022-04-12 DIAGNOSIS — M9901 Segmental and somatic dysfunction of cervical region: Secondary | ICD-10-CM

## 2022-04-12 DIAGNOSIS — M9903 Segmental and somatic dysfunction of lumbar region: Secondary | ICD-10-CM

## 2022-04-12 DIAGNOSIS — M9908 Segmental and somatic dysfunction of rib cage: Secondary | ICD-10-CM

## 2022-04-12 DIAGNOSIS — M9904 Segmental and somatic dysfunction of sacral region: Secondary | ICD-10-CM

## 2022-04-12 DIAGNOSIS — M9902 Segmental and somatic dysfunction of thoracic region: Secondary | ICD-10-CM

## 2022-04-12 DIAGNOSIS — G8929 Other chronic pain: Secondary | ICD-10-CM

## 2022-04-12 NOTE — Assessment & Plan Note (Signed)
Patient did have a recent illness.  Likely contributing to some of the discomfort and pain.  Was doing a lot more coughing.  Did have some good range of motion noted, discussed icing regimen and home exercises, which activities to do and which ones to avoid.  Increase activity slowly.  Discussed 6 to 8 weeks follow-up.

## 2022-04-12 NOTE — Patient Instructions (Signed)
Good to see you Got good movement See me in 5-6 weeks 

## 2022-05-23 NOTE — Progress Notes (Unsigned)
[place] Phone: [phone] Subjective:   [name], am serving as a scribe for Dr. Hulan Saas.  I'm seeing this patient by the request  of:  Michell Heinrich, MD  CC: Back and neck pain follow-up  [id number]  Julia [name] is a 57 y.o. female coming in with complaint of back and neck pain. OMT 04/12/2022. Patient states that she is the same as last visit.   Medications patient has been prescribed: None  Taking:         Reviewed prior external information including notes and imaging from previsou exam, outside providers and external EMR if available.   As well as notes that were available from care everywhere and other healthcare systems.  Past medical history, social, surgical and family history all reviewed in electronic medical record.  No pertanent information unless stated regarding to the chief complaint.   Past Medical History:  Diagnosis Date   Asthma with allergic rhinitis    Elevated liver enzymes    Liver disease    Fatty liver   Migraines    Polycystic ovaries 1984   Sleep apnea syndrome    Subclinical hypothyroidism     Allergies  Allergen Reactions   Relafen [Nabumetone] Hives, Itching and Swelling   Sulfa Antibiotics    Clindamycin/Lincomycin Rash    Oral ONLY     Review of Systems:  No headache, visual changes, nausea, vomiting, diarrhea, constipation, dizziness, abdominal pain, skin rash, fevers, chills, night sweats, weight loss, swollen lymph nodes, body aches, joint swelling, chest pain, shortness of breath, mood changes. POSITIVE muscle aches  Objective  Blood pressure 112/78, pulse 77, height 5\' 4"  (1.626 m), weight 201 lb (91.2 kg), SpO2 97 %.   General: No apparent distress alert and oriented x3 mood and affect normal, dressed appropriately.  HEENT: Pupils equal, extraocular movements intact  Respiratory: Patient's speak in full sentences and does  not appear short of breath  Cardiovascular: No lower extremity edema, non tender, no erythema  Back exam does have some loss of lordosis noted.  Patient does have some tenderness to palpation more over the right greater trochanteric area than usual.  Positive Corky Sox on the right side.  Foot exam does have pes planus noted does have some tenderness over the lateral aspect of the ankle.    Osteopathic findings  C3 flexed rotated and side bent right C5 flexed rotated and side bent right T3 extended rotated and side bent right inhaled rib T8 extended rotated and side bent left L2 flexed rotated and side bent right Sacrum right on right  [id number]; 15 additional minutes spent for Therapeutic exercises as stated in above notes.  This included exercises focusing on stretching, strengthening, with significant focus on eccentric aspects.   Long term goals include an improvement in range of motion, strength, endurance as well as avoiding reinjury. Patient's frequency would include in 1-2 times a day, 3-5 times a week for a duration of 6-12 weeks. Ankle strengthening that included:  Basic range of motion exercises to allow proper full motion at ankle Stretching of the lower leg and hamstrings  Theraband exercises for the lower leg - inversion, eversion, dorsiflexion and plantarflexion each to be completed with a theraband Balance exercises to increase proprioception Weight bearing exercises to increase strength and balance  Proper technique shown and discussed handout in great detail with ATC.  All questions were discussed and answered.  Assessment and Plan:  Gluteal tendonitis of right buttock Continues to have some discomfort over the gluteal area and the greater trochanteric area.  Discussed with patient icing regimen and home exercises, which activities to do and which ones to avoid.  Increase activity slowly otherwise.  Follow-up with me again in 6 worsening pain will need to consider  injection.  Peroneal tendinitis of left lower extremity Discussed home exercises and icing regimen.  Discussed proper shoes.  Excessive amount of rotation of the ankle.  Discussed different ergonomic changes such as going up and down stairs.  Heel lift could be beneficial.  Follow-up again in 6 to 8 weeks    Nonallopathic problems  Decision today to treat with OMT was based on Physical Exam  After verbal consent patient was treated with HVLA, ME, FPR techniques in cervical, rib, thoracic, lumbar, and sacral  areas  Patient tolerated the procedure well with improvement in symptoms  Patient given exercises, stretches and lifestyle modifications  See medications in patient instructions if given  Patient will follow up in 4-8 weeks    The above documentation has been reviewed and is accurate and complete Lyndal Pulley, DO          Note: This dictation was prepared with Dragon dictation along with smaller phrase technology. Any transcriptional errors that result from this process are unintentional.

## 2022-05-24 ENCOUNTER — Ambulatory Visit (INDEPENDENT_AMBULATORY_CARE_PROVIDER_SITE_OTHER): Payer: BC Managed Care – PPO | Admitting: Family Medicine

## 2022-05-24 ENCOUNTER — Encounter: Payer: Self-pay | Admitting: Family Medicine

## 2022-05-24 VITALS — BP 112/78 | HR 77 | Ht 64.0 in | Wt 201.0 lb

## 2022-05-24 DIAGNOSIS — M9902 Segmental and somatic dysfunction of thoracic region: Secondary | ICD-10-CM

## 2022-05-24 DIAGNOSIS — M9908 Segmental and somatic dysfunction of rib cage: Secondary | ICD-10-CM

## 2022-05-24 DIAGNOSIS — M9901 Segmental and somatic dysfunction of cervical region: Secondary | ICD-10-CM

## 2022-05-24 DIAGNOSIS — M9904 Segmental and somatic dysfunction of sacral region: Secondary | ICD-10-CM | POA: Diagnosis not present

## 2022-05-24 DIAGNOSIS — M7601 Gluteal tendinitis, right hip: Secondary | ICD-10-CM

## 2022-05-24 DIAGNOSIS — M9903 Segmental and somatic dysfunction of lumbar region: Secondary | ICD-10-CM

## 2022-05-24 DIAGNOSIS — M7672 Peroneal tendinitis, left leg: Secondary | ICD-10-CM | POA: Diagnosis not present

## 2022-05-24 NOTE — Assessment & Plan Note (Signed)
Continues to have some discomfort over the gluteal area and the greater trochanteric area.  Discussed with patient icing regimen and home exercises, which activities to do and which ones to avoid.  Increase activity slowly otherwise.  Follow-up with me again in 6 worsening pain will need to consider injection.

## 2022-05-24 NOTE — Assessment & Plan Note (Signed)
Discussed home exercises and icing regimen.  Discussed proper shoes.  Excessive amount of rotation of the ankle.  Discussed different ergonomic changes such as going up and down stairs.  Heel lift could be beneficial.  Follow-up again in 6 to 8 weeks

## 2022-05-24 NOTE — Patient Instructions (Signed)
Peroneal tendon exercises Keep watching the hip Good luck with the wedding See me in 7 weeks

## 2022-07-11 NOTE — Progress Notes (Signed)
  [name] [address] [place] [address] Phone: [phone] Subjective:   Julia [name], am serving as a scribe for Dr. Antoine Primas.  I'm seeing this patient by the request  of:  Zannie Cove, MD  CC: back and neck pain follow up   [id number]  Julia [name] is a 57 y.o. female coming in with complaint of back and neck pain. OMT 05/24/2022. Also f/u for R glute pain. Patient states that she is the same as last visit. Tightness of the right   Medications patient has been prescribed: None  Taking:         Reviewed prior external information including notes and imaging from previsou exam, outside providers and external EMR if available.   As well as notes that were available from care everywhere and other healthcare systems.  Past medical history, social, surgical and family history all reviewed in electronic medical record.  No pertanent information unless stated regarding to the chief complaint.   Past Medical History:  Diagnosis Date   Asthma with allergic rhinitis    Elevated liver enzymes    Liver disease    Fatty liver   Migraines    Polycystic ovaries 1984   Sleep apnea syndrome    Subclinical hypothyroidism     Allergies  Allergen Reactions   Relafen [Nabumetone] Hives, Itching and Swelling   Sulfa Antibiotics    Clindamycin/Lincomycin Rash    Oral ONLY     Review of Systems:  No headache, visual changes, nausea, vomiting, diarrhea, constipation, dizziness, abdominal pain, skin rash, fevers, chills, night sweats, weight loss, swollen lymph nodes, body aches, joint swelling, chest pain, shortness of breath, mood changes. POSITIVE muscle aches  Objective  Blood pressure 102/70, pulse (!) 56, height 5\' 4"  (1.626 m), weight 197 lb (89.4 kg), SpO2 97 %.   General: No apparent distress alert and oriented x3 mood and affect normal, dressed appropriately.  HEENT: Pupils equal, extraocular movements intact   Respiratory: Patient's speak in full sentences and does not appear short of breath  Cardiovascular: No lower extremity edema, non tender, no erythema  Neck exam does have loss of lordosis  Low back does have tightness noted with FABER bilaterally  Neg SLT    Osteopathic findings  C3 flexed rotated and side bent right C5 flexed rotated and side bent left T3 extended rotated and side bent right inhaled rib T6 extended rotated and side bent left L2 flexed rotated and side bent right Sacrum right on right       Assessment and Plan:  Low back pain Low back does have loss of lordosis  Discussed HEP  Discussed core strengthening  RTC in 6-8 weeks     Nonallopathic problems  Decision today to treat with OMT was based on Physical Exam  After verbal consent patient was treated with HVLA, ME, FPR techniques in cervical, rib, thoracic, lumbar, and sacral  areas  Patient tolerated the procedure well with improvement in symptoms  Patient given exercises, stretches and lifestyle modifications  See medications in patient instructions if given  Patient will follow up in 4-8 weeks    The above documentation has been reviewed and is accurate and complete Judi Saa, DO          Note: This dictation was prepared with Dragon dictation along with smaller phrase technology. Any transcriptional errors that result from this process are unintentional.

## 2022-07-12 ENCOUNTER — Encounter: Payer: Self-pay | Admitting: Family Medicine

## 2022-07-12 ENCOUNTER — Ambulatory Visit (INDEPENDENT_AMBULATORY_CARE_PROVIDER_SITE_OTHER): Payer: BC Managed Care – PPO | Admitting: Family Medicine

## 2022-07-12 VITALS — BP 102/70 | HR 56 | Ht 64.0 in | Wt 197.0 lb

## 2022-07-12 DIAGNOSIS — G8929 Other chronic pain: Secondary | ICD-10-CM | POA: Diagnosis not present

## 2022-07-12 DIAGNOSIS — M9903 Segmental and somatic dysfunction of lumbar region: Secondary | ICD-10-CM

## 2022-07-12 DIAGNOSIS — M545 Low back pain, unspecified: Secondary | ICD-10-CM | POA: Diagnosis not present

## 2022-07-12 DIAGNOSIS — M9904 Segmental and somatic dysfunction of sacral region: Secondary | ICD-10-CM | POA: Diagnosis not present

## 2022-07-12 DIAGNOSIS — M9908 Segmental and somatic dysfunction of rib cage: Secondary | ICD-10-CM | POA: Diagnosis not present

## 2022-07-12 DIAGNOSIS — M9901 Segmental and somatic dysfunction of cervical region: Secondary | ICD-10-CM

## 2022-07-12 DIAGNOSIS — M9902 Segmental and somatic dysfunction of thoracic region: Secondary | ICD-10-CM

## 2022-07-12 NOTE — Patient Instructions (Addendum)
Good to see you See me in again in 6-8 weeks

## 2022-07-12 NOTE — Assessment & Plan Note (Signed)
Low back does have loss of lordosis  Discussed HEP  Discussed core strengthening  RTC in 6-8 weeks

## 2022-08-04 ENCOUNTER — Other Ambulatory Visit: Payer: Self-pay | Admitting: Otolaryngology

## 2022-08-04 ENCOUNTER — Ambulatory Visit
Admission: RE | Admit: 2022-08-04 | Discharge: 2022-08-04 | Disposition: A | Discharge status: Home / Self Care | Payer: BC Managed Care – PPO | Source: Ambulatory Visit | Attending: Otolaryngology | Admitting: Otolaryngology

## 2022-08-04 DIAGNOSIS — J329 Chronic sinusitis, unspecified: Secondary | ICD-10-CM

## 2022-09-05 NOTE — Progress Notes (Unsigned)
  [name] [address] [place] [address] Phone: [phone] Subjective:    I'm seeing this patient by the request  of:  Zannie Cove, MD  CC:   [id number]  Julia [name] is a 57 y.o. female coming in with complaint of back and neck pain. OMT 07/12/2022. Patient states   Medications patient has been prescribed: None  Taking:         Reviewed prior external information including notes and imaging from previsou exam, outside providers and external EMR if available.   As well as notes that were available from care everywhere and other healthcare systems.  Past medical history, social, surgical and family history all reviewed in electronic medical record.  No pertanent information unless stated regarding to the chief complaint.   Past Medical History:  Diagnosis Date   Asthma with allergic rhinitis    Elevated liver enzymes    Liver disease    Fatty liver   Migraines    Polycystic ovaries 1984   Sleep apnea syndrome    Subclinical hypothyroidism     Allergies  Allergen Reactions   Relafen [Nabumetone] Hives, Itching and Swelling   Sulfa Antibiotics    Clindamycin/Lincomycin Rash    Oral ONLY     Review of Systems:  No headache, visual changes, nausea, vomiting, diarrhea, constipation, dizziness, abdominal pain, skin rash, fevers, chills, night sweats, weight loss, swollen lymph nodes, body aches, joint swelling, chest pain, shortness of breath, mood changes. POSITIVE muscle aches  Objective  There were no vitals taken for this visit.   General: No apparent distress alert and oriented x3 mood and affect normal, dressed appropriately.  HEENT: Pupils equal, extraocular movements intact  Respiratory: Patient's speak in full sentences and does not appear short of breath  Cardiovascular: No lower extremity edema, non tender, no erythema  Gait MSK:  Back   Osteopathic findings  C2 flexed rotated and side bent  right C6 flexed rotated and side bent left T3 extended rotated and side bent right inhaled rib T9 extended rotated and side bent left L2 flexed rotated and side bent right Sacrum right on right       Assessment and Plan:  No problem-specific Assessment & Plan notes found for this encounter.    Nonallopathic problems  Decision today to treat with OMT was based on Physical Exam  After verbal consent patient was treated with HVLA, ME, FPR techniques in cervical, rib, thoracic, lumbar, and sacral  areas  Patient tolerated the procedure well with improvement in symptoms  Patient given exercises, stretches and lifestyle modifications  See medications in patient instructions if given  Patient will follow up in 4-8 weeks             Note: This dictation was prepared with Dragon dictation along with smaller phrase technology. Any transcriptional errors that result from this process are unintentional.

## 2022-09-06 ENCOUNTER — Ambulatory Visit (INDEPENDENT_AMBULATORY_CARE_PROVIDER_SITE_OTHER): Payer: BC Managed Care – PPO | Admitting: Family Medicine

## 2022-09-06 ENCOUNTER — Encounter: Payer: Self-pay | Admitting: Family Medicine

## 2022-09-06 VITALS — BP 104/76 | HR 67 | Ht 64.0 in | Wt 199.0 lb

## 2022-09-06 DIAGNOSIS — M9908 Segmental and somatic dysfunction of rib cage: Secondary | ICD-10-CM | POA: Diagnosis not present

## 2022-09-06 DIAGNOSIS — M9901 Segmental and somatic dysfunction of cervical region: Secondary | ICD-10-CM | POA: Diagnosis not present

## 2022-09-06 DIAGNOSIS — M9903 Segmental and somatic dysfunction of lumbar region: Secondary | ICD-10-CM

## 2022-09-06 DIAGNOSIS — G8929 Other chronic pain: Secondary | ICD-10-CM | POA: Diagnosis not present

## 2022-09-06 DIAGNOSIS — M545 Low back pain, unspecified: Secondary | ICD-10-CM | POA: Diagnosis not present

## 2022-09-06 DIAGNOSIS — M9902 Segmental and somatic dysfunction of thoracic region: Secondary | ICD-10-CM

## 2022-09-06 DIAGNOSIS — M9904 Segmental and somatic dysfunction of sacral region: Secondary | ICD-10-CM | POA: Diagnosis not present

## 2022-09-06 NOTE — Assessment & Plan Note (Signed)
Multifactorial low back pain.  Discussed icing regimen and home exercises.  Has Zanaflex for breakthrough pain as needed.  Patient to continue work on [employer].  Negative straight leg test noted still at this time.  Follow-up with me again in 6 to 8 weeks did have tightness noted at the parascapular area.  Will continue to monitor.

## 2022-09-06 NOTE — Patient Instructions (Signed)
Good to see you Break out the rower Find something fun for your week off See me in 6-8 weeks

## 2022-10-17 NOTE — Progress Notes (Unsigned)
[name] [address] [place] [address] Phone: [phone] Subjective:   [name], am serving as a scribe for Dr. Antoine Primas.  I'm seeing this patient by the request  of:  Zannie Cove, MD  CC: back and neck pain follow up   [id number]  Hadden Perryman is a 57 y.o. female coming in with complaint of back and neck pain. OMT 09/06/2022. Patient states that she has some tightness in L trap. Pain is the same as last visit.  Pain in R ankle over mortise when she is walking. Pain with PF.   L shoulder pain continues over middle deltoid. Unable to keep elbows in when rowing due to handles on her machine. Pain is intermittent and feels like a catching.   Medications patient has been prescribed: None  Taking:         Reviewed prior external information including notes and imaging from previsou exam, outside providers and external EMR if available.   As well as notes that were available from care everywhere and other healthcare systems.  Past medical history, social, surgical and family history all reviewed in electronic medical record.  No pertanent information unless stated regarding to the chief complaint.   Past Medical History:  Diagnosis Date   Asthma with allergic rhinitis    Elevated liver enzymes    Liver disease    Fatty liver   Migraines    Polycystic ovaries 1984   Sleep apnea syndrome    Subclinical hypothyroidism     Allergies  Allergen Reactions   Relafen [Nabumetone] Hives, Itching and Swelling   Sulfa Antibiotics    Clindamycin/Lincomycin Rash    Oral ONLY     Review of Systems:  No headache, visual changes, nausea, vomiting, diarrhea, constipation, dizziness, abdominal pain, skin rash, fevers, chills, night sweats, weight loss, swollen lymph nodes, body aches, joint swelling, chest pain, shortness of breath, mood changes. POSITIVE muscle aches  Objective  Blood pressure 114/86, pulse 62,  height 5\' 4"  (1.626 m), weight 193 lb (87.5 kg), SpO2 98%.   General: No apparent distress alert and oriented x3 mood and affect normal, dressed appropriately.  HEENT: Pupils equal, extraocular movements intact  Respiratory: Patient's speak in full sentences and does not appear short of breath  Cardiovascular: No lower extremity edema, non tender, no erythema  MSK:  Back loss lordosis of the lumbar spine.  Patient does have significant stiffness noted on the left side of the paraspinal musculature.  Tightness with FABER test also noted on the left.  Patient's right ankle does have some tenderness to palpation over the peroneal tendons noted.  Audible clicking noted.  Osteopathic findings C2 flexed rotated and side bent right C5 flexed rotated and side bent left T1 with elevated rib on the right T3 extended rotated and side bent right inhaled rib T9 extended rotated and side bent left L2 flexed rotated and side bent right Sacrum right on right    Assessment and Plan:  Neck pain, bilateral Having headaches as well as vertigo intermittently.  This still seems to be secondary to her neck and more cervicogenic.  Is going to try Botox injections which I think would be potentially beneficial.  We discussed continuing with the home exercises and icing regimen.  Increase activity as tolerated.  Follow-up with me again in 6 to 8 weeks.  Peroneal tendinitis of left lower extremity Has had peroneal tendinitis bilaterally.  Discussed with patient about different exercises and icing  regimen.  Increase activity slowly over the course of the next several days.  Follow-up with me again in 6 to 8 weeks otherwise.    Nonallopathic problems  Decision today to treat with OMT was based on Physical Exam  After verbal consent patient was treated with HVLA, ME, FPR techniques in cervical, rib, thoracic, lumbar, and sacral  areas  Patient tolerated the procedure well with improvement in symptoms  Patient  given exercises, stretches and lifestyle modifications  See medications in patient instructions if given  Patient will follow up in 4-8 weeks    The above documentation has been reviewed and is accurate and complete Judi Saa, DO          Note: This dictation was prepared with Dragon dictation along with smaller phrase technology. Any transcriptional errors that result from this process are unintentional.

## 2022-10-18 ENCOUNTER — Other Ambulatory Visit: Payer: Self-pay

## 2022-10-18 ENCOUNTER — Ambulatory Visit (INDEPENDENT_AMBULATORY_CARE_PROVIDER_SITE_OTHER): Payer: BC Managed Care – PPO | Admitting: Family Medicine

## 2022-10-18 ENCOUNTER — Encounter: Payer: Self-pay | Admitting: Family Medicine

## 2022-10-18 VITALS — BP 114/86 | HR 62 | Ht 64.0 in | Wt 193.0 lb

## 2022-10-18 DIAGNOSIS — M25512 Pain in left shoulder: Secondary | ICD-10-CM

## 2022-10-18 DIAGNOSIS — M9908 Segmental and somatic dysfunction of rib cage: Secondary | ICD-10-CM | POA: Diagnosis not present

## 2022-10-18 DIAGNOSIS — M9902 Segmental and somatic dysfunction of thoracic region: Secondary | ICD-10-CM | POA: Diagnosis not present

## 2022-10-18 DIAGNOSIS — G8929 Other chronic pain: Secondary | ICD-10-CM | POA: Diagnosis not present

## 2022-10-18 DIAGNOSIS — M9904 Segmental and somatic dysfunction of sacral region: Secondary | ICD-10-CM

## 2022-10-18 DIAGNOSIS — M7672 Peroneal tendinitis, left leg: Secondary | ICD-10-CM | POA: Diagnosis not present

## 2022-10-18 DIAGNOSIS — M9901 Segmental and somatic dysfunction of cervical region: Secondary | ICD-10-CM | POA: Diagnosis not present

## 2022-10-18 DIAGNOSIS — M9903 Segmental and somatic dysfunction of lumbar region: Secondary | ICD-10-CM

## 2022-10-18 DIAGNOSIS — M542 Cervicalgia: Secondary | ICD-10-CM | POA: Diagnosis not present

## 2022-10-18 NOTE — Assessment & Plan Note (Signed)
Has had peroneal tendinitis bilaterally.  Discussed with patient about different exercises and icing regimen.  Increase activity slowly over the course of the next several days.  Follow-up with me again in 6 to 8 weeks otherwise.

## 2022-10-18 NOTE — Patient Instructions (Addendum)
Good to see you 1/8 inch heel lift Rowing got your shoulder 1.5 min at a time See me in 6 weeks

## 2022-10-18 NOTE — Assessment & Plan Note (Signed)
Having headaches as well as vertigo intermittently.  This still seems to be secondary to her neck and more cervicogenic.  Is going to try Botox injections which I think would be potentially beneficial.  We discussed continuing with the home exercises and icing regimen.  Increase activity as tolerated.  Follow-up with me again in 6 to 8 weeks.

## 2022-12-07 NOTE — Progress Notes (Unsigned)
[name] [address] [place] [address] Phone: [phone] Subjective:   Julia [name], am serving as a scribe for Dr. Antoine Primas.  I'm seeing this patient by the request  of:  Zannie Cove, MD  CC: Neck pain, headache follow-up  [id number]  Julia [name] is a 57 y.o. female coming in with complaint of back and neck pain. OMT 10/18/2022. Also f/u for L shoulder and L ankle pain. Patient states same per usual. Exercises seem to be aggravating. So not sure whether to continue or stop them.  Medications patient has been prescribed: None  Taking:         Reviewed prior external information including notes and imaging from previsou exam, outside providers and external EMR if available.   As well as notes that were available from care everywhere and other healthcare systems.  Past medical history, social, surgical and family history all reviewed in electronic medical record.  No pertanent information unless stated regarding to the chief complaint.   Past Medical History:  Diagnosis Date   Asthma with allergic rhinitis    Elevated liver enzymes    Liver disease    Fatty liver   Migraines    Polycystic ovaries 1984   Sleep apnea syndrome    Subclinical hypothyroidism     Allergies  Allergen Reactions   Relafen [Nabumetone] Hives, Itching and Swelling   Sulfa Antibiotics    Clindamycin/Lincomycin Rash    Oral ONLY     Review of Systems:  No headache, visual changes, nausea, vomiting, diarrhea, constipation, dizziness, abdominal pain, skin rash, fevers, chills, night sweats, weight loss, swollen lymph nodes, body aches, joint swelling, chest pain, shortness of breath, mood changes. POSITIVE muscle aches  Objective  Blood pressure (!) 120/90, pulse 79, height 5\' 4"  (1.626 m), weight 197 lb (89.4 kg), SpO2 95%.   General: No apparent distress alert and oriented x3 mood and affect normal, dressed appropriately.   HEENT: Pupils equal, extraocular movements intact  Respiratory: Patient's speak in full sentences and does not appear short of breath  Cardiovascular: No lower extremity edema, non tender, no erythema  Ankle exam bilaterally does have some overpronation noted.  Some tenderness to palpation noted.  Neck exam does have some loss lordosis noted as well.  Tenderness to palpation in the paraspinal musculature of the neck noted with limited sidebending bilaterally and lacks last 5 degrees of extension.  Osteopathic findings  C2 flexed rotated and side bent right C5 flexed rotated and side bent left T3 extended rotated and side bent right inhaled rib T5 extended rotated and side bent left L2 flexed rotated and side bent right Sacrum right on right       Assessment and Plan:  Neck pain, bilateral Discussed icing regimen and home exercises, discussed which activities to do and which ones to avoid.  Increase activity slowly over the course of the next several weeks.  Discussed icing regimen.  Follow-up again in 6 to 8 weeks.  Low back pain Stable, continue to work on the home exercises and icing regimen.  Follow-up with 6 to 8 weeks  Right ankle pain Continued to be multifactorial.  Will continue to monitor.  Continue to encourage wearing good shoes.  Take a hiatus from some of the exercises for the next 2 weeks.  If worsening pain will consider advanced imaging.  Follow-up again in 6 weeks    Nonallopathic problems  Decision today to treat with OMT was based on  Physical Exam  After verbal consent patient was treated with HVLA, ME, FPR techniques in cervical, rib, thoracic, lumbar, and sacral  areas  Patient tolerated the procedure well with improvement in symptoms  Patient given exercises, stretches and lifestyle modifications  See medications in patient instructions if given  Patient will follow up in 4-8 weeks    The above documentation has been reviewed and is accurate and  complete Judi Saa, DO          Note: This dictation was prepared with Dragon dictation along with smaller phrase technology. Any transcriptional errors that result from this process are unintentional.

## 2022-12-08 ENCOUNTER — Ambulatory Visit: Payer: BC Managed Care – PPO | Admitting: Family Medicine

## 2022-12-08 ENCOUNTER — Encounter: Payer: Self-pay | Admitting: Family Medicine

## 2022-12-08 VITALS — BP 120/90 | HR 79 | Ht 64.0 in | Wt 197.0 lb

## 2022-12-08 DIAGNOSIS — M9908 Segmental and somatic dysfunction of rib cage: Secondary | ICD-10-CM

## 2022-12-08 DIAGNOSIS — M9904 Segmental and somatic dysfunction of sacral region: Secondary | ICD-10-CM | POA: Diagnosis not present

## 2022-12-08 DIAGNOSIS — M542 Cervicalgia: Secondary | ICD-10-CM | POA: Diagnosis not present

## 2022-12-08 DIAGNOSIS — M545 Low back pain, unspecified: Secondary | ICD-10-CM | POA: Diagnosis not present

## 2022-12-08 DIAGNOSIS — M9903 Segmental and somatic dysfunction of lumbar region: Secondary | ICD-10-CM

## 2022-12-08 DIAGNOSIS — G8929 Other chronic pain: Secondary | ICD-10-CM

## 2022-12-08 DIAGNOSIS — M9902 Segmental and somatic dysfunction of thoracic region: Secondary | ICD-10-CM | POA: Diagnosis not present

## 2022-12-08 DIAGNOSIS — M25571 Pain in right ankle and joints of right foot: Secondary | ICD-10-CM | POA: Diagnosis not present

## 2022-12-08 DIAGNOSIS — M9901 Segmental and somatic dysfunction of cervical region: Secondary | ICD-10-CM | POA: Diagnosis not present

## 2022-12-08 NOTE — Assessment & Plan Note (Signed)
Discussed icing regimen and home exercises, discussed which activities to do and which ones to avoid.  Increase activity slowly over the course of the next several weeks.  Discussed icing regimen.  Follow-up again in 6 to 8 weeks.

## 2022-12-08 NOTE — Patient Instructions (Signed)
See you again in 6 weeks Pause on ankle exercises

## 2022-12-08 NOTE — Assessment & Plan Note (Signed)
Continued to be multifactorial.  Will continue to monitor.  Continue to encourage wearing good shoes.  Take a hiatus from some of the exercises for the next 2 weeks.  If worsening pain will consider advanced imaging.  Follow-up again in 6 weeks

## 2022-12-08 NOTE — Assessment & Plan Note (Signed)
Stable, continue to work on the home exercises and icing regimen.  Follow-up with 6 to 8 weeks

## 2022-12-21 ENCOUNTER — Other Ambulatory Visit: Payer: Self-pay | Admitting: Family Medicine

## 2022-12-21 NOTE — Telephone Encounter (Signed)
Sent patient message to see if she was still using medication.

## 2023-01-18 NOTE — Progress Notes (Unsigned)
[name] [address] [place] [address] Phone: [phone] Subjective:   [name], am serving as a scribe for Dr. Antoine Primas.  I'm seeing this patient by the request  of:  Zannie Cove, MD  CC: Chronic back and neck pain that is worsened recently.  [id number]  Julia [name] is a 57 y.o. female coming in with complaint of back and neck pain. OMT 01/08/2023. Patient states the she reached in back of car and tweeked neck but it has improved.   Medications patient has been prescribed: None  Taking:         Reviewed prior external information including notes and imaging from previsou exam, outside providers and external EMR if available.   As well as notes that were available from care everywhere and other healthcare systems.  Past medical history, social, surgical and family history all reviewed in electronic medical record.  No pertanent information unless stated regarding to the chief complaint.   Past Medical History:  Diagnosis Date   Asthma with allergic rhinitis    Elevated liver enzymes    Liver disease    Fatty liver   Migraines    Polycystic ovaries 1984   Sleep apnea syndrome    Subclinical hypothyroidism     Allergies  Allergen Reactions   Relafen [Nabumetone] Hives, Itching and Swelling   Sulfa Antibiotics    Clindamycin/Lincomycin Rash    Oral ONLY     Review of Systems:  No headache, visual changes, nausea, vomiting, diarrhea, constipation, dizziness, abdominal pain, skin rash, fevers, chills, night sweats, weight loss, swollen lymph nodes, body aches, joint swelling, chest pain, shortness of breath, mood changes. POSITIVE muscle aches  Objective  Blood pressure 112/78, pulse 67, height 5\' 4"  (1.626 m), weight 196 lb (88.9 kg), SpO2 98%.   General: No apparent distress alert and oriented x3 mood and affect normal, dressed appropriately.  HEENT: Pupils equal, extraocular movements intact   Respiratory: Patient's speak in full sentences and does not appear short of breath  Cardiovascular: No lower extremity edema, non tender, no erythema  Gait MSK:  Back does have significant more tightness noted today.  No pain in the gluteal areas as well.  Patient does have tightness in the neck noted.  Some limited sidebending bilaterally.  Osteopathic findings  C2 flexed rotated and side bent right C3 flexed rotated and side bent right C7 flexed rotated and side bent left T3 extended rotated and side bent right inhaled rib T9 extended rotated and side bent left L3 flexed rotated and side bent left L4 flexed rotated and side bent right Sacrum right on right     Assessment and Plan:  Low back pain Low back pain that seems to be exacerbated with patient having time for herself.  Patient's mother has been put on hospice recently.  He is doing rehabilitation with patient is having stress.  Refilled hydroxyzine.  Hopefully this will be helpful.  I do not think it is time to make other significant changes at the moment.  Patient will continue to be working at the moment.  Follow-up with me again in 6 to 8 weeks otherwise.    Nonallopathic problems  Decision today to treat with OMT was based on Physical Exam  After verbal consent patient was treated with HVLA, ME, FPR techniques in cervical, rib, thoracic, lumbar, and sacral  areas  Patient tolerated the procedure well with improvement in symptoms  Patient given exercises, stretches and lifestyle  modifications  See medications in patient instructions if given  Patient will follow up in 4-8 weeks     The above documentation has been reviewed and is accurate and complete Judi Saa, DO         Note: This dictation was prepared with Dragon dictation along with smaller phrase technology. Any transcriptional errors that result from this process are unintentional.

## 2023-01-19 ENCOUNTER — Other Ambulatory Visit: Payer: Self-pay | Admitting: Family Medicine

## 2023-01-19 ENCOUNTER — Ambulatory Visit: Payer: BC Managed Care – PPO | Admitting: Family Medicine

## 2023-01-19 ENCOUNTER — Encounter: Payer: Self-pay | Admitting: Family Medicine

## 2023-01-19 VITALS — BP 112/78 | HR 67 | Ht 64.0 in | Wt 196.0 lb

## 2023-01-19 DIAGNOSIS — M9901 Segmental and somatic dysfunction of cervical region: Secondary | ICD-10-CM

## 2023-01-19 DIAGNOSIS — M545 Low back pain, unspecified: Secondary | ICD-10-CM | POA: Diagnosis not present

## 2023-01-19 DIAGNOSIS — M9908 Segmental and somatic dysfunction of rib cage: Secondary | ICD-10-CM | POA: Diagnosis not present

## 2023-01-19 DIAGNOSIS — M9903 Segmental and somatic dysfunction of lumbar region: Secondary | ICD-10-CM | POA: Diagnosis not present

## 2023-01-19 DIAGNOSIS — G8929 Other chronic pain: Secondary | ICD-10-CM | POA: Diagnosis not present

## 2023-01-19 DIAGNOSIS — M9902 Segmental and somatic dysfunction of thoracic region: Secondary | ICD-10-CM

## 2023-01-19 DIAGNOSIS — M9904 Segmental and somatic dysfunction of sacral region: Secondary | ICD-10-CM

## 2023-01-19 MED ORDER — HYDROXYZINE HCL 10 MG PO TABS: 10.0000 mg | ORAL_TABLET | Freq: Three times a day (TID) | ORAL | 0 refills | Status: AC | PRN

## 2023-01-19 NOTE — Assessment & Plan Note (Signed)
Low back pain that seems to be exacerbated with patient having time for herself.  Patient's mother has been put on hospice recently.  He is doing rehabilitation with patient is having stress.  Refilled hydroxyzine.  Hopefully this will be helpful.  I do not think it is time to make other significant changes at the moment.  Patient will continue to be working at the moment.  Follow-up with me again in 6 to 8 weeks otherwise.

## 2023-01-19 NOTE — Patient Instructions (Signed)
Hydroxizine refilled You know where we are if you need [place] See [place] in 6-8 weeks

## 2023-03-06 ENCOUNTER — Encounter: Payer: Self-pay | Admitting: Family Medicine

## 2023-03-09 ENCOUNTER — Ambulatory Visit: Payer: BC Managed Care – PPO | Admitting: Sports Medicine

## 2023-03-09 VITALS — BP 128/92 | HR 86 | Ht 64.0 in | Wt 196.0 lb

## 2023-03-09 DIAGNOSIS — M9904 Segmental and somatic dysfunction of sacral region: Secondary | ICD-10-CM

## 2023-03-09 DIAGNOSIS — M7061 Trochanteric bursitis, right hip: Secondary | ICD-10-CM

## 2023-03-09 DIAGNOSIS — M9903 Segmental and somatic dysfunction of lumbar region: Secondary | ICD-10-CM

## 2023-03-09 DIAGNOSIS — M9905 Segmental and somatic dysfunction of pelvic region: Secondary | ICD-10-CM

## 2023-03-09 DIAGNOSIS — M25552 Pain in left hip: Secondary | ICD-10-CM | POA: Diagnosis not present

## 2023-03-09 DIAGNOSIS — M545 Low back pain, unspecified: Secondary | ICD-10-CM | POA: Diagnosis not present

## 2023-03-09 DIAGNOSIS — G8929 Other chronic pain: Secondary | ICD-10-CM

## 2023-03-09 MED ORDER — METHYLPREDNISOLONE 4 MG PO TBPK: ORAL_TABLET | ORAL | 0 refills | Status: DC

## 2023-03-09 NOTE — Patient Instructions (Signed)
 Prednisone dose pack Follow up in 2-3 weeks with Dr. Jean Rosenthal or Dr. Katrinka Blazing

## 2023-03-09 NOTE — Progress Notes (Signed)
 [facility] Aubryana Vittorio [facility] [address] [place] [address] Phone: [phone]   Assessment and Plan:     1. Greater trochanteric bursitis of right hip -Chronic with exacerbation, subsequent visit - Consistent with recurrent greater trochanteric bursitis of right hip 2. Left hip pain -Acute, initial visit - Most consistent with flare of mild hip osteoarthritis as seen on prior x-ray imaging caused by missing a step when walking downstairs 3. Chronic bilateral low back pain without sciatica -Chronic with exacerbation, subsequent visit - Recurrent mild and intermittent chronic low back pain without MOI red flag symptoms  - Based on multiple areas of pain, patient agreed to start with p.o. anti-inflammatory.  Prednisone  Dosepak Prescription sent to pharmacy.  Do not recommend NSAID use due to history of gastritis and hives with Relafen - May use previously prescribed tizanidine  as needed for muscle spasms  4. Somatic dysfunction of lumbar region 5. Somatic dysfunction of pelvic region 6. Somatic dysfunction of sacral region - Patient has received relief with OMT in the past.  Elects for repeat OMT today.  Tolerated well per note below. - Decision today to treat with OMT was based on Physical Exam  After verbal consent patient was treated with HVLA (high velocity low amplitude), ME (muscle energy), FPR (flex positional release), ST (soft tissue), PC/PD (Pelvic Compression/ Pelvic Decompression) techniques in sacrum, lumbar, and pelvic areas. Patient tolerated the procedure well with improvement in symptoms.  Patient educated on potential side effects of soreness and recommended to rest, hydrate, and use Tylenol as needed for pain control.    Pertinent previous records reviewed include none  Follow Up: 2 to 3 weeks for reevaluation.  Could consider greater trochanteric CSI versus physical therapy versus repeat OMT versus alternative CSI based on  remaining symptoms   Subjective:   I, Julia [name], am serving as a scribe for Dr  Morene Mace.  Chief Complaint: New left hip pain, recurrent right hip pain, recurrent back pain  HPI:   03/09/23 Patient notes stepping off of steps wrong and her lumbar spine and hip pain increased. Also notes that she was lifting her ailing mother a lot prior to her passing on the 19th of December.   Pain in lateral aspect of R hip with radiating symptoms to R tibia. Pain in L hip over hip flexor.   Relevant Historical Information: None pertinent  Additional pertinent review of systems negative.   Current Outpatient Medications:    albuterol (PROVENTIL HFA;VENTOLIN HFA) 108 (90 BASE) MCG/ACT inhaler, Inhale 2 puffs into the lungs every 6 (six) hours as needed. prn, Disp: , Rfl:    Ascorbic Acid (VITAMIN C) 1000 MG tablet, Take 1,000 mg by mouth daily., Disp: , Rfl:    Biotin 800 MCG TABS, Take 1 tablet by mouth daily., Disp: , Rfl:    buPROPion  (WELLBUTRIN  XL) 150 MG 24 hr tablet, Take 1 tablet (150 mg total) by mouth daily., Disp: 90 tablet, Rfl: 1   Coenzyme Q10 (CO Q-10) 200 MG CAPS, Take by mouth daily. , Disp: , Rfl:    Cyanocobalamin 5000 MCG SUBL, Place under the tongue., Disp: , Rfl:    diazepam  (VALIUM ) 5 MG tablet, Take 1 tablet (5 mg total) by mouth every 12 (twelve) hours as needed. One tab by mouth, 2 hours before procedure., Disp: 10 tablet, Rfl: 0   EVENING PRIMROSE OIL PO, Take 1.3 g by mouth daily., Disp: , Rfl:    hydrOXYzine  (ATARAX ) 10 MG  tablet, Take 1 tablet (10 mg total) by mouth 3 (three) times daily as needed., Disp: 30 tablet, Rfl: 0   L-Methylfolate (DEPLIN PO), Take 400 mcg by mouth 2 (two) times daily., Disp: , Rfl:    levocetirizine (XYZAL) 5 MG tablet, Take 5 mg by mouth every evening., Disp: , Rfl:    levothyroxine (SYNTHROID) 25 MCG tablet, Take 25 mcg by mouth daily before breakfast., Disp: , Rfl:    MAGNESIUM CITRATE PO, Take by mouth., Disp: , Rfl:     methylPREDNISolone  (MEDROL  DOSEPAK) 4 MG TBPK tablet, Take 6 tablets on day 1.  Take 5 tablets on day 2.  Take 4 tablets on day 3.  Take 3 tablets on day 4.  Take 2 tablets on day 5.  Take 1 tablet on day 6., Disp: 21 tablet, Rfl: 0   montelukast (SINGULAIR) 10 MG tablet, Take 10 mg by mouth Daily., Disp: , Rfl:    Multiple Vitamins-Minerals (ZINC PO), Take 1 capsule by mouth 2 (two) times daily., Disp: , Rfl:    Omega-3 Fatty Acids (FISH OIL) 300 MG CAPS, Take 900 mg by mouth daily. , Disp: , Rfl:    promethazine  (PHENERGAN ) 25 MG tablet, Take 1 tablet (25 mg total) by mouth every 6 (six) hours as needed for nausea or vomiting., Disp: 30 tablet, Rfl: 2   Rimegepant Sulfate (NURTEC PO), Take 75 mg by mouth every other day., Disp: , Rfl:    spironolactone (ALDACTONE) 50 MG tablet, Take 50 mg by mouth daily., Disp: , Rfl:    thyroid  (ARMOUR) 60 MG tablet, Take 60 mg by mouth daily before breakfast., Disp: , Rfl:    vitamin A 25000 UNIT capsule, Take 25,000 Units by mouth 3 (three) times a week. , Disp: , Rfl:    Vitamin D , Ergocalciferol , (DRISDOL ) 1.25 MG (50000 UT) CAPS capsule, TAKE 1 CAP (50,000) UNITS BY MOUTH EVERY 7 DAYS, Disp: 12 capsule, Rfl: 0   vitamin E 400 UNIT capsule, Take 400 Units by mouth every other day., Disp: , Rfl:    Vonoprazan Fumarate (VOQUEZNA PO), Take 20 mg by mouth., Disp: , Rfl:    Objective:     Vitals:   03/09/23 1001  BP: (!) 128/92  Pulse: 86  SpO2: 95%  Weight: 196 lb (88.9 kg)  Height: 5' 4 (1.626 m)      Body mass index is 33.64 kg/m.    Physical Exam:     General: awake, alert, and oriented no acute distress, nontoxic Skin: no suspicious lesions or rashes Neuro:sensation intact distally with no deficits, normal muscle tone, no atrophy, strength 5/5 in all tested lower ext groups Psych: normal mood and affect, speech clear   Bilateral hip: No deformity, swelling or wasting ROM Flexion 90, ext 30, IR 45, ER 45 TTP moderately right greater  trochanter NTTP over the hip flexors, left greater trochanter, gluteal musculature, si joint, lumbar spine Negative log roll with FROM Negative FABER Negative Piriformis test Negative trendelenberg Gait normal  Left anterior groin pain with hip flexion and FADIR  OMT Physical Exam:  ASIS Compression Test: Positive Right Sacrum: Positive sphinx, TTP bilateral sacral base Lumbar: TTP paraspinal, L1-3 RRSL Pelvis: Right anterior innominate   Electronically signed by:  Odis Mace [facility] 10:36 AM 03/09/23

## 2023-03-16 ENCOUNTER — Ambulatory Visit: Payer: BC Managed Care – PPO | Admitting: Family Medicine

## 2023-04-12 NOTE — Progress Notes (Signed)
 [name] [address] [place] [address] Phone: [phone] Subjective:   Julia [name], am serving as a scribe for Dr. Arthea [name].  I'm seeing this patient by the request  of:  [name], MD  CC: Back and neck pain follow-up  YEP:Dlagzrupcz  Julia [name] is a 58 y.o. female coming in with complaint of back and neck pain. OMT 01/19/2024. Saw Dr. Leonce in January 2025 for L hip and OMT. Placed on prednisone . Patient states that she missed a step in January and saw Dr. Leonce after that for L hip pain. Prednisone  calmed down the L hip pain. Patient started walking this past week and pain in GT increased. Feels stiff all over for some reason. Cramping in R shin over tib anterior that has been going on for a while but is worsening. Wonders if her stomach acid medication is causing the cramps.   Medications patient has been prescribed:   Taking:         Reviewed prior external information including notes and imaging from previsou exam, outside providers and external EMR if available.   As well as notes that were available from care everywhere and other healthcare systems.  Past medical history, social, surgical and family history all reviewed in electronic medical record.  No pertanent information unless stated regarding to the chief complaint.   Past Medical History:  Diagnosis Date   Asthma with allergic rhinitis    Elevated liver enzymes    Liver disease    Fatty liver   Migraines    Polycystic ovaries 1984   Sleep apnea syndrome    Subclinical hypothyroidism     Allergies  Allergen Reactions   Relafen [Nabumetone] Hives, Itching and Swelling   Sulfa Antibiotics    Clindamycin/Lincomycin Rash    Oral ONLY     Review of Systems:  No headache, visual changes, nausea, vomiting, diarrhea, constipation, dizziness, abdominal pain, skin rash, fevers, chills, night sweats, weight loss, swollen lymph nodes, body  aches, joint swelling, chest pain, shortness of breath, mood changes. POSITIVE muscle aches  Objective  Blood pressure 128/88, pulse 68, height 5' 4 (1.626 m), weight 196 lb (88.9 kg), SpO2 99%.   General: No apparent distress alert and oriented x3 mood and affect normal, dressed appropriately.  HEENT: Pupils equal, extraocular movements intact  Respiratory: Patient's speak in full sentences and does not appear short of breath  Cardiovascular: No lower extremity edema, non tender, no erythema  Gait relatively normal MSK:  Back does have some loss lordosis noted severe tenderness to palpation over the greater trochanteric area on the right side  Osteopathic findings  C2 flexed rotated and side bent right C7 flexed rotated and side bent left T3 extended rotated and side bent right inhaled rib T5 extended rotated and side bent left L2 flexed rotated and side bent right Sacrum right on right    After verbal consent patient was prepped with alcohol swab and with a 21-gauge 2 inch needle injected into the right gluteal tendon sheath a with 2 cc of 0.5% Marcaine and 1 cc of Kenalog 40 mg/mL.  No blood loss.  Band-Aid placed.  Postinjection instructions given    Assessment and Plan:  Gluteal tendonitis of right buttock Given injection noted. Discussed HEP  Discussed which activities patient hopefully will respond extremely well to this injection.  Has been having this pain intermittently since 2016.  Hopeful that this will make a big difference.  Discussed icing  regimen.  Increase activity slowly.  Follow-up again in 6 to 8 weeks.    Nonallopathic problems  Decision today to treat with OMT was based on Physical Exam  After verbal consent patient was treated with HVLA, ME, FPR techniques in cervical, rib, thoracic, lumbar, and sacral  areas  Patient tolerated the procedure well with improvement in symptoms  Patient given exercises, stretches and lifestyle modifications  See  medications in patient instructions if given  Patient will follow up in 4-8 weeks    The above documentation has been reviewed and is accurate and complete Cathern Tahir M Aayush Gelpi, DO          Note: This dictation was prepared with Dragon dictation along with smaller phrase technology. Any transcriptional errors that result from this process are unintentional.

## 2023-04-13 ENCOUNTER — Encounter: Payer: Self-pay | Admitting: Family Medicine

## 2023-04-13 ENCOUNTER — Ambulatory Visit: Payer: BC Managed Care – PPO | Admitting: Family Medicine

## 2023-04-13 VITALS — BP 128/88 | HR 68 | Ht 64.0 in | Wt 196.0 lb

## 2023-04-13 DIAGNOSIS — M545 Low back pain, unspecified: Secondary | ICD-10-CM

## 2023-04-13 DIAGNOSIS — M9908 Segmental and somatic dysfunction of rib cage: Secondary | ICD-10-CM | POA: Diagnosis not present

## 2023-04-13 DIAGNOSIS — M7601 Gluteal tendinitis, right hip: Secondary | ICD-10-CM | POA: Diagnosis not present

## 2023-04-13 DIAGNOSIS — G8929 Other chronic pain: Secondary | ICD-10-CM

## 2023-04-13 DIAGNOSIS — M9901 Segmental and somatic dysfunction of cervical region: Secondary | ICD-10-CM | POA: Diagnosis not present

## 2023-04-13 DIAGNOSIS — M9904 Segmental and somatic dysfunction of sacral region: Secondary | ICD-10-CM | POA: Diagnosis not present

## 2023-04-13 DIAGNOSIS — M9903 Segmental and somatic dysfunction of lumbar region: Secondary | ICD-10-CM

## 2023-04-13 DIAGNOSIS — M9902 Segmental and somatic dysfunction of thoracic region: Secondary | ICD-10-CM | POA: Diagnosis not present

## 2023-04-13 NOTE — Patient Instructions (Signed)
 Injected hip See me in 6-8 weeks

## 2023-04-13 NOTE — Assessment & Plan Note (Signed)
 Given injection noted. Discussed HEP  Discussed which activities patient hopefully will respond extremely well to this injection.  Has been having this pain intermittently since 2016.  Hopeful that this will make a big difference.  Discussed icing regimen.  Increase activity slowly.  Follow-up again in 6 to 8 weeks.

## 2023-04-13 NOTE — Assessment & Plan Note (Addendum)
 Discussed with patient at great length.  Will do more strengthening.  I am hoping that patient increasing activity will be helpful and why wanted to be more aggressive with patient's tendinitis or gluteal injury.

## 2023-05-24 NOTE — Progress Notes (Unsigned)
 [name] [address] [place] [address] Phone: [phone] Subjective:   [name], am serving as a scribe for Dr. Antoine Primas.  I'm seeing this patient by the request  of:  Zannie Cove, MD  CC: Back and neck pain follow-up  [id number]  Santia Labate is a 58 y.o. female coming in with complaint of back and neck pain. OMT 04/13/2023. Patient states that her neck remains tight. Had Botox last week.  Has not noticed significant improvement yet.  He does have tightness noted.  Sometimes can be quite significant.  Has tried a new pillow with not much success either.  Medications patient has been prescribed: Hydroxizine  Taking:         Reviewed prior external information including notes and imaging from previsou exam, outside providers and external EMR if available.   As well as notes that were available from care everywhere and other healthcare systems.  Past medical history, social, surgical and family history all reviewed in electronic medical record.  No pertanent information unless stated regarding to the chief complaint.   Past Medical History:  Diagnosis Date   Asthma with allergic rhinitis    Elevated liver enzymes    Liver disease    Fatty liver   Migraines    Polycystic ovaries 1984   Sleep apnea syndrome    Subclinical hypothyroidism     Allergies  Allergen Reactions   Relafen [Nabumetone] Hives, Itching and Swelling   Sulfa Antibiotics    Clindamycin/Lincomycin Rash    Oral ONLY     Review of Systems:  No  visual changes, nausea, vomiting, diarrhea, constipation, dizziness, abdominal pain, skin rash, fevers, chills, night sweats, weight loss, swollen lymph nodes, body aches, joint swelling, chest pain, shortness of breath, mood changes. POSITIVE muscle aches, headache  Objective  Blood pressure 116/78, pulse 74, height 5\' 4"  (1.626 m), SpO2 98%.   General: No apparent distress alert and  oriented x3 mood and affect normal, dressed appropriately.  HEENT: Pupils equal, extraocular movements intact  Respiratory: Patient's speak in full sentences and does not appear short of breath  Cardiovascular: No lower extremity edema, non tender, no erythema  MSK:  Back does have some loss lordosis noted.  Some tenderness to palpation in the paraspinal musculature.  Neck exam does have some limited sidebending bilaterally.  Negative Spurling's.  Osteopathic findings  C2 flexed rotated and side bent right C5 flexed rotated and side bent right C6 flexed rotated and side bent left T3 extended rotated and side bent right inhaled rib T7 extended rotated and side bent left L1 flexed rotated and side bent right L3 F RS left  Sacrum right on right     Assessment and Plan:  Neck pain, bilateral Chronic problem that does give patient some cervicogenic headaches.  Discussed icing regimen to avoid.  Increase activity slowly over the course of next several weeks.  Regimen.  Follow-up again in 6 to 8 weeks otherwise.    Nonallopathic problems  Decision today to treat with OMT was based on Physical Exam  After verbal consent patient was treated with HVLA, ME, FPR techniques in cervical, rib, thoracic, lumbar, and sacral  areas  Patient tolerated the procedure well with improvement in symptoms  Patient given exercises, stretches and lifestyle modifications  See medications in patient instructions if given  Patient will follow up in 4-8 weeks    The above documentation has been reviewed and is accurate and complete Earna Coder  Dwan Bolt, DO          Note: This dictation was prepared with Dragon dictation along with smaller phrase technology. Any transcriptional errors that result from this process are unintentional.

## 2023-05-25 ENCOUNTER — Encounter: Payer: Self-pay | Admitting: Family Medicine

## 2023-05-25 ENCOUNTER — Ambulatory Visit: Payer: BC Managed Care – PPO | Admitting: Family Medicine

## 2023-05-25 VITALS — BP 116/78 | HR 74 | Ht 64.0 in

## 2023-05-25 DIAGNOSIS — M9901 Segmental and somatic dysfunction of cervical region: Secondary | ICD-10-CM | POA: Diagnosis not present

## 2023-05-25 DIAGNOSIS — M9903 Segmental and somatic dysfunction of lumbar region: Secondary | ICD-10-CM | POA: Diagnosis not present

## 2023-05-25 DIAGNOSIS — M9908 Segmental and somatic dysfunction of rib cage: Secondary | ICD-10-CM | POA: Diagnosis not present

## 2023-05-25 DIAGNOSIS — M9904 Segmental and somatic dysfunction of sacral region: Secondary | ICD-10-CM

## 2023-05-25 DIAGNOSIS — M542 Cervicalgia: Secondary | ICD-10-CM

## 2023-05-25 DIAGNOSIS — M9902 Segmental and somatic dysfunction of thoracic region: Secondary | ICD-10-CM

## 2023-05-25 NOTE — Assessment & Plan Note (Signed)
 Chronic problem that does give patient some cervicogenic headaches.  Discussed icing regimen to avoid.  Increase activity slowly over the course of next several weeks.  Regimen.  Follow-up again in 6 to 8 weeks otherwise.

## 2023-05-25 NOTE — Patient Instructions (Signed)
 Hope you get to go on vacation See me in 6-8 weeks

## 2023-07-12 NOTE — Progress Notes (Unsigned)
 [facility] [address] [place] [address] Phone: [phone] Subjective:   Julia [name], am serving as a scribe for Dr. Ronnell Coins.  I'm seeing this patient by the request  of:  Lynnda Sas, MD  CC: Back and neck pain follow-up  [id number]  Julia [name] is a 58 y.o. female coming in with complaint of back and neck pain. OMT 05/25/2023. Patient states  That her neck is the same as last visit.   Medications patient has been prescribed: None  Taking:         Reviewed prior external information including notes and imaging from previsou exam, outside providers and external EMR if available.   As well as notes that were available from care everywhere and other healthcare systems.  Past medical history, social, surgical and family history all reviewed in electronic medical record.  No pertanent information unless stated regarding to the chief complaint.   Past Medical History:  Diagnosis Date   Asthma with allergic rhinitis    Elevated liver enzymes    Liver disease    Fatty liver   Migraines    Polycystic ovaries 1984   Sleep apnea syndrome    Subclinical hypothyroidism     Allergies  Allergen Reactions   Relafen [Nabumetone] Hives, Itching and Swelling   Sulfa Antibiotics    Clindamycin/Lincomycin Rash    Oral ONLY     Review of Systems:  No headache, visual changes, nausea, vomiting, diarrhea, constipation, dizziness, abdominal pain, skin rash, fevers, chills, night sweats, weight loss, swollen lymph nodes, body aches, joint swelling, chest pain, shortness of breath, mood changes. POSITIVE muscle aches  Objective  Blood pressure 110/72, pulse 73, height 5\' 4"  (1.626 m), weight 202 lb (91.6 kg), SpO2 95%.   General: No apparent distress alert and oriented x3 mood and affect normal, dressed appropriately.  HEENT: Pupils equal, extraocular movements intact  Respiratory: Patient's speak in full sentences and  does not appear short of breath  Cardiovascular: No lower extremity edema, non tender, no erythema  Gait relatively normal MSK:  Back does have some loss lordosis noted.  Some tenderness to palpation of the paraspinal musculature.  Osteopathic findings  C2 flexed rotated and side bent right C7 flexed rotated and side bent left T3 extended rotated and side bent right inhaled rib T9 extended rotated and side bent left L2 flexed rotated and side bent right Sacrum right on right     Assessment and Plan:  Low back pain Continued tightness noted.  Discussed icing regimen and home exercises, increase activity slowly.  Tightness with Veldon German right greater than left.  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  Patient is going to be going to the beach in the near future and I do think that this will be helpful.    Nonallopathic problems  Decision today to treat with OMT was based on Physical Exam  After verbal consent patient was treated with HVLA, ME, FPR techniques in cervical, rib, thoracic, lumbar, and sacral  areas  Patient tolerated the procedure well with improvement in symptoms  Patient given exercises, stretches and lifestyle modifications  See medications in patient instructions if given  Patient will follow up in 4-8 weeks    The above documentation has been reviewed and is accurate and complete Julia [name] M Aunisty Reali, DO          Note: This dictation was prepared with Dragon dictation along with smaller phrase technology. Any transcriptional errors that result  from this process are unintentional.

## 2023-07-13 ENCOUNTER — Ambulatory Visit: Admitting: Family Medicine

## 2023-07-13 ENCOUNTER — Encounter: Payer: Self-pay | Admitting: Family Medicine

## 2023-07-13 VITALS — BP 110/72 | HR 73 | Ht 64.0 in | Wt 202.0 lb

## 2023-07-13 DIAGNOSIS — M9902 Segmental and somatic dysfunction of thoracic region: Secondary | ICD-10-CM

## 2023-07-13 DIAGNOSIS — M545 Low back pain, unspecified: Secondary | ICD-10-CM | POA: Diagnosis not present

## 2023-07-13 DIAGNOSIS — M9904 Segmental and somatic dysfunction of sacral region: Secondary | ICD-10-CM

## 2023-07-13 DIAGNOSIS — M9901 Segmental and somatic dysfunction of cervical region: Secondary | ICD-10-CM

## 2023-07-13 DIAGNOSIS — M9903 Segmental and somatic dysfunction of lumbar region: Secondary | ICD-10-CM | POA: Diagnosis not present

## 2023-07-13 DIAGNOSIS — G8929 Other chronic pain: Secondary | ICD-10-CM

## 2023-07-13 DIAGNOSIS — M9908 Segmental and somatic dysfunction of rib cage: Secondary | ICD-10-CM

## 2023-07-13 NOTE — Patient Instructions (Signed)
 Good to see you! Enjoy the beach See you again in 6-8 weeks

## 2023-07-13 NOTE — Assessment & Plan Note (Addendum)
 Continued tightness noted.  Discussed icing regimen and home exercises, increase activity slowly.  Tightness with Julia [name] right greater than left.  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  Patient is going to be going to the beach in the near future and I do think that this will be helpful.

## 2023-08-28 NOTE — Progress Notes (Unsigned)
 [name] [address] [place] [address] Phone: [phone] Subjective:    I'm seeing this patient by the request  of:  [name], MD  CC: Back and neck pain f/u   YEP:Dlagzrupcz  Julia [name] is a 58 y.o. female coming in with complaint of back and neck pain. OMT 07/13/2023. Patient states she is not doing well. Mowed the lawn and she slip and fell on her butt. Massage therapist stated her hips were not aligned. She states she hit her knee as well.   Medications patient has been prescribed: None       Reviewed prior external information including notes and imaging from previsou exam, outside providers and external EMR if available.   As well as notes that were available from care everywhere and other healthcare systems.  Past medical history, social, surgical and family history all reviewed in electronic medical record.  No pertanent information unless stated regarding to the chief complaint.   Past Medical History:  Diagnosis Date   Asthma with allergic rhinitis    Elevated liver enzymes    Liver disease    Fatty liver   Migraines    Polycystic ovaries 1984   Sleep apnea syndrome    Subclinical hypothyroidism     Allergies  Allergen Reactions   Relafen [Nabumetone] Hives, Itching and Swelling   Sulfa Antibiotics    Clindamycin/Lincomycin Rash    Oral ONLY     Review of Systems:  No headache, visual changes, nausea, vomiting, diarrhea, constipation, dizziness, abdominal pain, skin rash, fevers, chills, night sweats, weight loss, swollen lymph nodes, body aches, joint swelling, chest pain, shortness of breath, mood changes. POSITIVE muscle aches  Objective  Blood pressure 128/78, pulse 79, height 5' 4 (1.626 m), weight 197 lb (89.4 kg), SpO2 99%.   General: No apparent distress alert and oriented x3 mood and affect normal, dressed appropriately.  HEENT: Pupils equal, extraocular movements intact  Respiratory:  Patient's speak in full sentences and does not appear short of breath  Cardiovascular: No lower extremity edema, non tender, no erythema  Gait MSK:  Back significant tightness in lower back.  Patient has had some weakness in the cord.  Some limited range of motion in all planes.  Osteopathic findings  C3 flexed rotated and side bent right C6 flexed rotated and side bent left T3 extended rotated and side bent right inhaled rib T9 extended rotated and side bent left L2 flexed rotated and side bent right L3 flexed rotated and side bent left Sacrum right on right     Assessment and Plan:  Low back pain Low back does have more of an exacerbation.  Discussed home exercises and icing regimen.  Increase activity slowly.  Discussed icing regimen.  Patient will continue to work on her weight loss.  Goal weight would be 160 pounds.  Follow-up again in 6 to 8 weeks.    Nonallopathic problems  Decision today to treat with OMT was based on Physical Exam  After verbal consent patient was treated with HVLA, ME, FPR techniques in cervical, rib, thoracic, lumbar, and sacral  areas  Patient tolerated the procedure well with improvement in symptoms  Patient given exercises, stretches and lifestyle modifications  See medications in patient instructions if given  Patient will follow up in 4-8 weeks     The above documentation has been reviewed and is accurate and complete Julia [name] M Julia Bixler, DO         Note: This  dictation was prepared with Dragon dictation along with smaller phrase technology. Any transcriptional errors that result from this process are unintentional.

## 2023-08-31 ENCOUNTER — Ambulatory Visit: Admitting: Family Medicine

## 2023-08-31 ENCOUNTER — Encounter: Payer: Self-pay | Admitting: Family Medicine

## 2023-08-31 VITALS — BP 128/78 | HR 79 | Ht 64.0 in | Wt 197.0 lb

## 2023-08-31 DIAGNOSIS — M9904 Segmental and somatic dysfunction of sacral region: Secondary | ICD-10-CM | POA: Diagnosis not present

## 2023-08-31 DIAGNOSIS — M545 Low back pain, unspecified: Secondary | ICD-10-CM | POA: Diagnosis not present

## 2023-08-31 DIAGNOSIS — M9908 Segmental and somatic dysfunction of rib cage: Secondary | ICD-10-CM

## 2023-08-31 DIAGNOSIS — M9902 Segmental and somatic dysfunction of thoracic region: Secondary | ICD-10-CM

## 2023-08-31 DIAGNOSIS — M9903 Segmental and somatic dysfunction of lumbar region: Secondary | ICD-10-CM | POA: Diagnosis not present

## 2023-08-31 DIAGNOSIS — M9901 Segmental and somatic dysfunction of cervical region: Secondary | ICD-10-CM | POA: Diagnosis not present

## 2023-08-31 DIAGNOSIS — G8929 Other chronic pain: Secondary | ICD-10-CM

## 2023-08-31 NOTE — Assessment & Plan Note (Signed)
 Low back does have more of an exacerbation.  Discussed home exercises and icing regimen.  Increase activity slowly.  Discussed icing regimen.  Patient will continue to work on her weight loss.  Goal weight would be 160 pounds.  Follow-up again in 6 to 8 weeks.

## 2023-08-31 NOTE — Patient Instructions (Signed)
 Try muscle relaxer for the next 3 night  Scap HEP  Continue to work on posture No more interesting yard work  8 week follow up

## 2023-09-12 ENCOUNTER — Encounter: Payer: Self-pay | Admitting: Family Medicine

## 2023-09-19 ENCOUNTER — Encounter: Payer: Self-pay | Admitting: Family Medicine

## 2023-10-25 NOTE — Progress Notes (Unsigned)
 [name] [address] [place] [address] Phone: [phone] Subjective:   Julia [name], am serving as a scribe for Dr. Arthea Claudene.  I'm seeing this patient by the request  of:  [name], MD  CC: Neck and back pain.  YEP:Dlagzrupcz  Julia [name] is a 58 y.o. female coming in with complaint of back and neck pain. OMT 08/31/2023. Also f/u for L knee pain. Patient states that her knee is doing much better.   Woke up one more and L shoulder was crunching. Unable to turn her head to the L. Has since calmed down but still has pain when reaching overhead. Hx of L shoulder bursitis in 2015.   Medications patient has been prescribed: None  Taking:         Reviewed prior external information including notes and imaging from previsou exam, outside providers and external EMR if available.   As well as notes that were available from care everywhere and other healthcare systems.  Past medical history, social, surgical and family history all reviewed in electronic medical record.  No pertanent information unless stated regarding to the chief complaint.   Past Medical History:  Diagnosis Date   Asthma with allergic rhinitis    Elevated liver enzymes    Liver disease    Fatty liver   Migraines    Polycystic ovaries 1984   Sleep apnea syndrome    Subclinical hypothyroidism     Allergies  Allergen Reactions   Relafen [Nabumetone] Hives, Itching and Swelling   Sulfa Antibiotics    Clindamycin/Lincomycin Rash    Oral ONLY     Review of Systems:  No headache, visual changes, nausea, vomiting, diarrhea, constipation, dizziness, abdominal pain, skin rash, fevers, chills, night sweats, weight loss, swollen lymph nodes, body aches, joint swelling, chest pain, shortness of breath, mood changes. POSITIVE muscle aches  Objective  Blood pressure 122/82, pulse 68, height 5' 4 (1.626 m), weight 198 lb (89.8 kg), SpO2 97%.   General:  No apparent distress alert and oriented x3 mood and affect normal, dressed appropriately.  HEENT: Pupils equal, extraocular movements intact  Respiratory: Patient's speak in full sentences and does not appear short of breath  Cardiovascular: No lower extremity edema, non tender, no erythema  Gait MSK:  Back   Osteopathic findings  C2 flexed rotated and side bent left C6 flexed rotated and side bent left T4 extended rotated and side bent left inhaled rib T6 extended rotated and side bent left inhaled rib L2 flexed rotated and side bent right Sacrum right on right       Assessment and Plan:  Neck pain, bilateral Mild increase, could be secondary to some of the yard work.  Discussed icing regimen and home exercises otherwise.  Increase activity slowly.  Discussed icing regimen.  Follow-up again 2 to 3 months.    Nonallopathic problems  Decision today to treat with OMT was based on Physical Exam  After verbal consent patient was treated with HVLA, ME, FPR techniques in cervical, rib, thoracic, lumbar, and sacral  areas  Patient tolerated the procedure well with improvement in symptoms  Patient given exercises, stretches and lifestyle modifications  See medications in patient instructions if given  Patient will follow up in 4-8 weeks    The above documentation has been reviewed and is accurate and complete Wilhemenia Camba M Jac Romulus, DO          Note: This dictation was prepared with Dragon dictation along  with smaller phrase technology. Any transcriptional errors that result from this process are unintentional.

## 2023-10-26 ENCOUNTER — Encounter: Payer: Self-pay | Admitting: Family Medicine

## 2023-10-26 ENCOUNTER — Ambulatory Visit: Admitting: Family Medicine

## 2023-10-26 VITALS — BP 122/82 | HR 68 | Ht 64.0 in | Wt 198.0 lb

## 2023-10-26 DIAGNOSIS — M542 Cervicalgia: Secondary | ICD-10-CM

## 2023-10-26 DIAGNOSIS — M9908 Segmental and somatic dysfunction of rib cage: Secondary | ICD-10-CM | POA: Diagnosis not present

## 2023-10-26 DIAGNOSIS — M9904 Segmental and somatic dysfunction of sacral region: Secondary | ICD-10-CM

## 2023-10-26 DIAGNOSIS — M9901 Segmental and somatic dysfunction of cervical region: Secondary | ICD-10-CM | POA: Diagnosis not present

## 2023-10-26 DIAGNOSIS — M9903 Segmental and somatic dysfunction of lumbar region: Secondary | ICD-10-CM | POA: Diagnosis not present

## 2023-10-26 DIAGNOSIS — M9902 Segmental and somatic dysfunction of thoracic region: Secondary | ICD-10-CM

## 2023-10-26 NOTE — Patient Instructions (Signed)
 Good to see you Good luck with dishwasher See me in 2-3 months

## 2023-10-26 NOTE — Assessment & Plan Note (Signed)
 Mild increase, could be secondary to some of the yard work.  Discussed icing regimen and home exercises otherwise.  Increase activity slowly.  Discussed icing regimen.  Follow-up again 2 to 3 months.

## 2023-12-25 NOTE — Progress Notes (Unsigned)
  [name] [address] [place] [address] Phone: [phone] Subjective:   Julia [name], am serving as a scribe for Dr. Arthea Claudene.  I'm seeing this patient by the request  of:  [name], MD  CC: Back and neck pain follow-up  YEP:Julia [name]  Julia [name] is a 58 y.o. female coming in with complaint of back and neck pain. OMT 10/26/2203. Patient states same per usual. Vertigo has gotten worse along with headaches. No new symptoms.  Medications patient has been prescribed: None  Taking:         Reviewed prior external information including notes and imaging from previsou exam, outside providers and external EMR if available.   As well as notes that were available from care everywhere and other healthcare systems.  Past medical history, social, surgical and family history all reviewed in electronic medical record.  No pertanent information unless stated regarding to the chief complaint.   Past Medical History:  Diagnosis Date   Asthma with allergic rhinitis    Elevated liver enzymes    Liver disease    Fatty liver   Migraines    Polycystic ovaries 1984   Sleep apnea syndrome    Subclinical hypothyroidism     Allergies  Allergen Reactions   Relafen [Nabumetone] Hives, Itching and Swelling   Sulfa Antibiotics    Clindamycin/Lincomycin Rash    Oral ONLY     Review of Systems:  No headache, visual changes, nausea, vomiting, diarrhea, constipation, dizziness, abdominal pain, skin rash, fevers, chills, night sweats, weight loss, swollen lymph nodes, body aches, joint swelling, chest pain, shortness of breath, mood changes. POSITIVE muscle aches  Objective  Blood pressure (!) 124/96, pulse 72, height 5' 4 (1.626 m), weight 196 lb (88.9 kg), SpO2 98%.   General: No apparent distress alert and oriented x3 mood and affect normal, dressed appropriately.  HEENT: Pupils equal, extraocular movements intact   Respiratory: Patient's speak in full sentences and does not appear short of breath  Cardiovascular: No lower extremity edema, non tender, no erythema  Gait MSK:  Back does have some loss lordosis, poor core strength still noted.  Hip abductor strength is mild.  Osteopathic findings  C2 flexed rotated and side bent right C6 flexed rotated and side bent left T3 extended rotated and side bent right inhaled rib T9 extended rotated and side bent left L2 flexed rotated and side bent right Sacrum right on right       Assessment and Plan:  No problem-specific Assessment & Plan notes found for this encounter.    Nonallopathic problems  Decision today to treat with OMT was based on Physical Exam  After verbal consent patient was treated with HVLA, ME, FPR techniques in cervical, rib, thoracic, lumbar, and sacral  areas  Patient tolerated the procedure well with improvement in symptoms  Patient given exercises, stretches and lifestyle modifications  See medications in patient instructions if given  Patient will follow up in 4-8 weeks     The above documentation has been reviewed and is accurate and complete Julia Mcdanel M Gelene Recktenwald, DO         Note: This dictation was prepared with Dragon dictation along with smaller phrase technology. Any transcriptional errors that result from this process are unintentional.

## 2023-12-26 ENCOUNTER — Encounter: Payer: Self-pay | Admitting: Family Medicine

## 2023-12-26 ENCOUNTER — Ambulatory Visit (INDEPENDENT_AMBULATORY_CARE_PROVIDER_SITE_OTHER): Admitting: Family Medicine

## 2023-12-26 VITALS — BP 124/96 | HR 72 | Ht 64.0 in | Wt 196.0 lb

## 2023-12-26 DIAGNOSIS — M9901 Segmental and somatic dysfunction of cervical region: Secondary | ICD-10-CM

## 2023-12-26 DIAGNOSIS — M9903 Segmental and somatic dysfunction of lumbar region: Secondary | ICD-10-CM

## 2023-12-26 DIAGNOSIS — M9908 Segmental and somatic dysfunction of rib cage: Secondary | ICD-10-CM | POA: Diagnosis not present

## 2023-12-26 DIAGNOSIS — M9902 Segmental and somatic dysfunction of thoracic region: Secondary | ICD-10-CM | POA: Diagnosis not present

## 2023-12-26 DIAGNOSIS — M542 Cervicalgia: Secondary | ICD-10-CM | POA: Diagnosis not present

## 2023-12-26 DIAGNOSIS — M9904 Segmental and somatic dysfunction of sacral region: Secondary | ICD-10-CM | POA: Diagnosis not present

## 2023-12-26 DIAGNOSIS — G8929 Other chronic pain: Secondary | ICD-10-CM

## 2023-12-26 DIAGNOSIS — M545 Low back pain, unspecified: Secondary | ICD-10-CM

## 2023-12-26 MED ORDER — PREDNISONE 20 MG PO TABS: 40.0000 mg | ORAL_TABLET | Freq: Every day | ORAL | 0 refills | Status: AC

## 2023-12-26 NOTE — Assessment & Plan Note (Signed)
 Tightness can be causing potential vertigo.  Patient also has sinusitis like symptoms.  We discussed the potential for the prednisone .  Discussed icing regimen and home exercises, increase activity slowly otherwise.  Worsening symptoms does need to seek medical attention.  Follow-up again in 6 to 8 weeks

## 2023-12-26 NOTE — Patient Instructions (Addendum)
 Good to see you! Prednisone  40mg  for 5 days If needed I get you bubblegum Augmentin See you again in 2- 3 months

## 2023-12-26 NOTE — Assessment & Plan Note (Signed)
 Low back is multifactorial.  Known degenerative disc and poor core strength.  Patient's job patient does have 2 stand for a long amount of time.  We discussed icing regimen and home exercises, discussed which activities to do and which ones to avoid.  Increase activity slowly.  Follow-up again in 6 to 8 weeks.

## 2024-02-22 NOTE — Progress Notes (Unsigned)
°  [name] [address] [place] [address] Phone: [phone] Subjective:    I'm seeing this patient by the request  of:  [name], MD  CC:   YEP:[id number]  Julia [name] is a 58 y.o. female coming in with complaint of back and neck pain. OMT 12/26/2023. Patient states   Medications patient has been prescribed: Prednisone    Taking:         Reviewed prior external information including notes and imaging from previsou exam, outside providers and external EMR if available.   As well as notes that were available from care everywhere and other healthcare systems.  Past medical history, social, surgical and family history all reviewed in electronic medical record.  No pertanent information unless stated regarding to the chief complaint.   Past Medical History:  Diagnosis Date   Asthma with allergic rhinitis    Elevated liver enzymes    Liver disease    Fatty liver   Migraines    Polycystic ovaries 1984   Sleep apnea syndrome    Subclinical hypothyroidism     Allergies[1]   Review of Systems:  No headache, visual changes, nausea, vomiting, diarrhea, constipation, dizziness, abdominal pain, skin rash, fevers, chills, night sweats, weight loss, swollen lymph nodes, body aches, joint swelling, chest pain, shortness of breath, mood changes. POSITIVE muscle aches  Objective  There were no vitals taken for this visit.   General: No apparent distress alert and oriented x3 mood and affect normal, dressed appropriately.  HEENT: Pupils equal, extraocular movements intact  Respiratory: Patient's speak in full sentences and does not appear short of breath  Cardiovascular: No lower extremity edema, non tender, no erythema  Gait MSK:  Back   Osteopathic findings  C2 flexed rotated and side bent right C6 flexed rotated and side bent left T3 extended rotated and side bent right inhaled rib T9 extended rotated and side bent  left L2 flexed rotated and side bent right Sacrum right on right       Assessment and Plan:  No problem-specific Assessment & Plan notes found for this encounter.    Nonallopathic problems  Decision today to treat with OMT was based on Physical Exam  After verbal consent patient was treated with HVLA, ME, FPR techniques in cervical, rib, thoracic, lumbar, and sacral  areas  Patient tolerated the procedure well with improvement in symptoms  Patient given exercises, stretches and lifestyle modifications  See medications in patient instructions if given  Patient will follow up in 4-8 weeks             Note: This dictation was prepared with Dragon dictation along with smaller phrase technology. Any transcriptional errors that result from this process are unintentional.            [1]  Allergies Allergen Reactions   Relafen [Nabumetone] Hives, Itching and Swelling   Sulfa Antibiotics    Clindamycin/Lincomycin Rash    Oral ONLY

## 2024-02-27 ENCOUNTER — Ambulatory Visit: Admitting: Family Medicine

## 2024-02-27 ENCOUNTER — Encounter: Payer: Self-pay | Admitting: Family Medicine

## 2024-02-27 VITALS — BP 110/72 | HR 60 | Ht 64.0 in | Wt 193.0 lb

## 2024-02-27 DIAGNOSIS — M9908 Segmental and somatic dysfunction of rib cage: Secondary | ICD-10-CM | POA: Diagnosis not present

## 2024-02-27 DIAGNOSIS — M542 Cervicalgia: Secondary | ICD-10-CM

## 2024-02-27 DIAGNOSIS — M9901 Segmental and somatic dysfunction of cervical region: Secondary | ICD-10-CM

## 2024-02-27 DIAGNOSIS — M9902 Segmental and somatic dysfunction of thoracic region: Secondary | ICD-10-CM | POA: Diagnosis not present

## 2024-02-27 DIAGNOSIS — M9904 Segmental and somatic dysfunction of sacral region: Secondary | ICD-10-CM

## 2024-02-27 DIAGNOSIS — M9903 Segmental and somatic dysfunction of lumbar region: Secondary | ICD-10-CM | POA: Diagnosis not present

## 2024-02-27 NOTE — Assessment & Plan Note (Signed)
 Bilateral, postural related, going on 12 years of intermittent pain.  Likely has been able to keep her job.  Continues to try to do the home exercises intermittently.  Discussed icing regimen and home exercises, increase activity slowly.  Discussed icing regimen.  Follow-up again in 6 to 12 weeks

## 2024-02-27 NOTE — Patient Instructions (Signed)
 Great to see you I'll look into the phones Keep working on posture Happy Holidays See me in 2 months

## 2024-04-30 ENCOUNTER — Ambulatory Visit: Admitting: Family Medicine
